# Patient Record
Sex: Female | Born: 1945 | Race: White | Hispanic: No | Marital: Married | Smoking: Never smoker
Health system: Southern US, Community
[De-identification: ages and names within clinical notes are randomized; demographics above are authoritative.]

## PROBLEM LIST (undated history)

## (undated) DIAGNOSIS — R0609 Other forms of dyspnea: Secondary | ICD-10-CM

## (undated) DIAGNOSIS — K219 Gastro-esophageal reflux disease without esophagitis: Secondary | ICD-10-CM

## (undated) DIAGNOSIS — M1990 Unspecified osteoarthritis, unspecified site: Secondary | ICD-10-CM

## (undated) DIAGNOSIS — E538 Deficiency of other specified B group vitamins: Secondary | ICD-10-CM

## (undated) DIAGNOSIS — M549 Dorsalgia, unspecified: Secondary | ICD-10-CM

## (undated) DIAGNOSIS — R251 Tremor, unspecified: Secondary | ICD-10-CM

## (undated) DIAGNOSIS — G8929 Other chronic pain: Secondary | ICD-10-CM

## (undated) DIAGNOSIS — K5790 Diverticulosis of intestine, part unspecified, without perforation or abscess without bleeding: Secondary | ICD-10-CM

## (undated) DIAGNOSIS — G4733 Obstructive sleep apnea (adult) (pediatric): Secondary | ICD-10-CM

## (undated) DIAGNOSIS — Z8719 Personal history of other diseases of the digestive system: Secondary | ICD-10-CM

## (undated) DIAGNOSIS — I10 Essential (primary) hypertension: Secondary | ICD-10-CM

## (undated) DIAGNOSIS — E876 Hypokalemia: Secondary | ICD-10-CM

## (undated) DIAGNOSIS — F988 Other specified behavioral and emotional disorders with onset usually occurring in childhood and adolescence: Secondary | ICD-10-CM

## (undated) DIAGNOSIS — F3341 Major depressive disorder, recurrent, in partial remission: Secondary | ICD-10-CM

## (undated) DIAGNOSIS — E785 Hyperlipidemia, unspecified: Secondary | ICD-10-CM

## (undated) DIAGNOSIS — Z8669 Personal history of other diseases of the nervous system and sense organs: Secondary | ICD-10-CM

## (undated) DIAGNOSIS — J309 Allergic rhinitis, unspecified: Secondary | ICD-10-CM

## (undated) DIAGNOSIS — G4700 Insomnia, unspecified: Secondary | ICD-10-CM

## (undated) DIAGNOSIS — M5430 Sciatica, unspecified side: Secondary | ICD-10-CM

## (undated) DIAGNOSIS — E079 Disorder of thyroid, unspecified: Secondary | ICD-10-CM

## (undated) DIAGNOSIS — D692 Other nonthrombocytopenic purpura: Secondary | ICD-10-CM

## (undated) DIAGNOSIS — Z87442 Personal history of urinary calculi: Secondary | ICD-10-CM

## (undated) DIAGNOSIS — E2740 Unspecified adrenocortical insufficiency: Secondary | ICD-10-CM

## (undated) DIAGNOSIS — R202 Paresthesia of skin: Secondary | ICD-10-CM

## (undated) HISTORY — DX: Other nonthrombocytopenic purpura: D69.2

## (undated) HISTORY — DX: Allergic rhinitis, unspecified: J30.9

## (undated) HISTORY — DX: Hyperlipidemia, unspecified: E78.5

## (undated) HISTORY — DX: Deficiency of other specified B group vitamins: E53.8

## (undated) HISTORY — PX: GANGLION CYST EXCISION: SHX1691

## (undated) HISTORY — DX: Essential (primary) hypertension: I10

## (undated) HISTORY — DX: Hypokalemia: E87.6

## (undated) HISTORY — DX: Major depressive disorder, recurrent, in partial remission: F33.41

## (undated) HISTORY — DX: Disorder of thyroid, unspecified: E07.9

## (undated) HISTORY — PX: ESOPHAGOGASTRODUODENOSCOPY: SHX1529

## (undated) HISTORY — DX: Tremor, unspecified: R25.1

## (undated) HISTORY — PX: COLONOSCOPY: SHX174

## (undated) HISTORY — DX: Obstructive sleep apnea (adult) (pediatric): G47.33

## (undated) HISTORY — DX: Unspecified adrenocortical insufficiency: E27.40

## (undated) HISTORY — PX: HAND SURGERY: SHX662

## (undated) HISTORY — PX: OTHER SURGICAL HISTORY: SHX169

## (undated) HISTORY — DX: Gastro-esophageal reflux disease without esophagitis: K21.9

## (undated) HISTORY — DX: Diverticulosis of intestine, part unspecified, without perforation or abscess without bleeding: K57.90

## (undated) HISTORY — DX: Other forms of dyspnea: R06.09

## (undated) HISTORY — PX: CARPAL TUNNEL RELEASE: SHX101

## (undated) HISTORY — DX: Other specified behavioral and emotional disorders with onset usually occurring in childhood and adolescence: F98.8

## (undated) HISTORY — DX: Unspecified osteoarthritis, unspecified site: M19.90

---

## 1998-07-22 ENCOUNTER — Encounter: Payer: Self-pay | Admitting: Internal Medicine

## 2000-02-23 ENCOUNTER — Encounter: Payer: Self-pay | Admitting: Internal Medicine

## 2000-03-10 ENCOUNTER — Encounter: Payer: Self-pay | Admitting: Internal Medicine

## 2000-03-11 ENCOUNTER — Encounter: Payer: Self-pay | Admitting: Internal Medicine

## 2008-11-19 ENCOUNTER — Ambulatory Visit: Payer: Self-pay | Admitting: Internal Medicine

## 2008-11-19 DIAGNOSIS — K219 Gastro-esophageal reflux disease without esophagitis: Secondary | ICD-10-CM | POA: Insufficient documentation

## 2009-01-02 ENCOUNTER — Ambulatory Visit: Payer: Self-pay | Admitting: Internal Medicine

## 2009-01-02 ENCOUNTER — Encounter: Payer: Self-pay | Admitting: Internal Medicine

## 2009-01-08 ENCOUNTER — Encounter: Payer: Self-pay | Admitting: Internal Medicine

## 2009-01-28 ENCOUNTER — Telehealth: Payer: Self-pay | Admitting: Internal Medicine

## 2009-02-20 ENCOUNTER — Ambulatory Visit: Payer: Self-pay | Admitting: Internal Medicine

## 2011-01-26 NOTE — Progress Notes (Signed)
Summary: Nexium rx  Phone Note Call from Patient Call back at Home Phone [phone]   Caller: Patient Call For: Dr. Juanda Chance Reason for Call: Talk to Nurse Details for Reason: Nexium rx Summary of Call: pt wants to know if she is to continue taking Nexium. Initial call taken by: Vallarie Mare,  January 28, 2009 9:54 AM  Follow-up for Phone Call        Pt. was taking Prevacid, states Dr.Karolee Meloni gave her samples of Nexium after Endoscopy. States the Nexium controls GERD better than Prevacid. Pt. will call her insurance company to discuss prices of her PPI, she will callback to let me know what script will need to be sent. Follow-up by: Laureen Ochs LPN,  January 28, 2009 10:00 AM  Additional Follow-up for Phone Call Additional follow up Details #1::        Pt. states that her PCP has given her a script for Nexium and it was approved by her insurance. I will update her med.list. Pt. instructed to call back as needed.  Additional Follow-up by: Laureen Ochs LPN,  January 30, 2009 9:07 AM    New/Updated Medications: NEXIUM 40 MG  CPDR (ESOMEPRAZOLE MAGNESIUM) 1 capsule each day 30 minutes before meal   Prescriptions: NEXIUM 40 MG  CPDR (ESOMEPRAZOLE MAGNESIUM) 1 capsule each day 30 minutes before meal  #30 x 6   Entered by:   Laureen Ochs LPN   Authorized by:   Hart Carwin MD   Signed by:   Laureen Ochs LPN on 19/14/7829   Method used:   Historical   RxID:   [id number]

## 2011-01-26 NOTE — Procedures (Signed)
Summary: Colonoscopy   Colonoscopy  Procedure date:  01/02/2009  Findings:      Location:  [facility].    Procedures Next Due Date:    Colonoscopy: 12/2018  COLONOSCOPY PROCEDURE REPORT  PATIENT:  Meghan Tucker, Meghan Tucker  MR#:  [id number] BIRTHDATE:   1946-04-08   GENDER:   female  ENDOSCOPIST:   Hedwig Morton. Juanda Chance, MD Referred by: Wyonia Hough, M.D.  PROCEDURE DATE:  01/02/2009 PROCEDURE:  Colonoscopy, diagnostic ASA CLASS:   Class I INDICATIONS: screening pt had a prior colonoscopy 10 years ago  MEDICATIONS:    Versed 5 mg, Fentanyl 50 mcg  DESCRIPTION OF PROCEDURE:   After the risks benefits and alternatives of the procedure were thoroughly explained, informed consent was obtained.  No rectal exam performed. The LB PCF-H180AL [id number] endoscope was introduced through the anus and advanced to the cecum, which was identified by both the appendix and ileocecal valve, without limitations.  The quality of the prep was good, using MiraLax.  The instrument was then slowly withdrawn as the colon was fully examined. <<PROCEDUREIMAGES>>      <<OLD IMAGES>>  FINDINGS:  Moderate diverticulosis was found in the sigmoid colon (see image1).  normal cecum (see image2 and image3).  normal rectum (see image4).   Retroflexed views in the rectum revealed no abnormalities.    The scope was then withdrawn from the patient and the procedure completed.  COMPLICATIONS:   None  ENDOSCOPIC IMPRESSION:  1) Moderate diverticulosis in the sigmoid colon  2) Normal cecum  3) Normal rectum  no polyps RECOMMENDATIONS:  1) high fiber diet  REPEAT EXAM:   In 10 year(s) for.   _______________________________ Hedwig Morton. Juanda Chance, MD  CC: Wyonia Hough, MD

## 2011-01-26 NOTE — Procedures (Signed)
Summary: EGD  EGD   Imported By: Francee Piccolo CMA 12/05/2008 14:57:46  _____________________________________________________________________  External Attachment:    Type:   Image     Comment:   External Document

## 2011-01-26 NOTE — Assessment & Plan Note (Signed)
Summary: f/u after col..em   History of Present Illness Visit Type: follow up Primary GI MD: Lina Sar MD Primary Provider: Anderson Malta Requesting Provider: n/a Chief Complaint: follow-up visit after colonoscopy History of Present Illness:   This is a 65 year old white female who recently underwent an upper endoscopy for evaluation of gastroesophageal reflux as well as a screening colonoscopy which showed only mild diverticulosis of the left colon. The upper endoscopy was done with biopsies which came back H. Pylori positive. Patient has completed Prevpac without any undesirable side effects. She continues on Nexium 40 mg a day. Patient prefers to continue on her Nexium because of her history of chronic intermittent reflux.   GI Review of Systems      Denies abdominal pain, acid reflux, belching, bloating, chest pain, dysphagia with liquids, dysphagia with solids, heartburn, loss of appetite, nausea, vomiting, vomiting blood, weight loss, and  weight gain.        Denies anal fissure, black tarry stools, change in bowel habit, constipation, diarrhea, diverticulosis, fecal incontinence, heme positive stool, hemorrhoids, irritable bowel syndrome, jaundice, light color stool, liver problems, rectal bleeding, and  rectal pain.   Updated Prior Medication List: FISH OIL  OIL (FISH OIL) 1 capsule by mouth once daily ASPIRIN 81 MG TABS (ASPIRIN) 1 by mouth once daily VITAMIN B-12 1000 MCG TABS (CYANOCOBALAMIN) 2500 micrograms once daily [facility] ALLERGY 25 MG CAPS (DIPHENHYDRAMINE HCL) 1 by mouth once daily VITAMIN C CR 1000 MG CR-TABS (ASCORBIC ACID) 1 by mouth once daily BIOTIN 1000 MCG TABS (BIOTIN) 1 by mouth once daily COSAMIN DS 500-400 MG CAPS (GLUCOSAMINE-CHONDROITIN) 2 by mouth once daily NEXIUM 40 MG  CPDR (ESOMEPRAZOLE MAGNESIUM) 1 capsule each day 30 minutes before meal SIMVASTATIN 40 MG TABS (SIMVASTATIN) 1/2 by mouth once daily SINGULAIR 10 MG TABS (MONTELUKAST SODIUM) 1 by  mouth once daily ZINC 15 66 MG TABS (ZINC SULFATE) 1 tablet by mouth once daily  Current Allergies (reviewed today): No known allergies  Past Medical History:    Reviewed history from 11/19/2008 and no changes required:       Arthritis       Asthma       Chronic Headaches       Diverticulosis       Hyperlipidemia  Past Surgical History:    Reviewed history from 11/19/2008 and no changes required:       Carpal Tunnel Release   Family History:    Reviewed history from 11/19/2008 and no changes required:       No FH of Colon Cancer:       Family History of Heart Disease: Mother and father  Mother had CHF Father died at age 66 -Massive Heart Attack  Social History:    Reviewed history from 11/19/2008 and no changes required:       Occupation: Housewife       Patient has never smoked.        Alcohol Use - yes 1-2 wine occ       Daily Caffeine Use  diet coke       Illicit Drug Use - no  Vital Signs:  Patient Profile:   65 Years Old Female Height:     61.5 inches Weight:      147 pounds BMI:     27.42 BSA:     1.67 Pulse rate:   84 / minute BP sitting:   130 / 84  (left arm)  Vitals Entered By: Milford Cage CMA (February 20, 2009 10:30 AM)                   Impression & Recommendations:  Problem # 1:  GERD (ICD-530.81) controlled on Nexium 40 mg daily. Patient will continue on Nexium and antireflux measures. She has completed her H. pylori treatment.  Problem # 2:  SPECIAL SCREENING FOR MALIGNANT NEOPLASMS COLON (ICD-V76.51) mild diverticulosis seen on a recent colonoscopy in January 2010. Her next colonoscopy will be due in 10 years.   Patient Instructions: 1)  Nexium 40 mg p.o. q.d. 2)  Antireflux measures 3)  Recall colonoscopy January 2020 4)  Copy Sent To:Dr Blane Ohara, Rosalita Levan

## 2011-01-26 NOTE — Procedures (Signed)
Summary: Colonoscopy  Colonoscopy   Imported By: Francee Piccolo CMA 12/05/2008 14:59:05  _____________________________________________________________________  External Attachment:    Type:   Image     Comment:   External Document

## 2011-01-26 NOTE — Miscellaneous (Signed)
Summary: prepak rx.  Clinical Lists Changes  Medications: Added new medication of PREVPAC   MISC (AMOXICILL-CLARITHRO-LANSOPRAZ) take as instructed. - Signed Rx of PREVPAC   MISC (AMOXICILL-CLARITHRO-LANSOPRAZ) take as instructed.;  #1 x 0;  Signed;  Entered by: Darlyn Read RN;  Authorized by: Hart Carwin MD;  Method used: Electronically to [facility] [address], [place], [place], [place]  [address], Ph: [phone], Fax: [phone]    Prescriptions: [facility]   MISC (AMOXICILL-CLARITHRO-LANSOPRAZ) take as instructed.  #1 x 0   Entered by:   Darlyn Read RN   Authorized by:   Hart Carwin MD   Signed by:   Darlyn Read RN on 01/08/2009   Method used:   Electronically to        [facility]* (retail)       [address] [place], [place]  [address]       Ph: [phone]       Fax: [phone]   RxID:   [id number]

## 2011-01-26 NOTE — Letter (Signed)
Summary: Patient [facility] Biopsy Results  [facility]  [address] [place], [place] [address]   Phone: [phone]  Fax: [phone]        January 08, 2009 MRN: [id number]    Meghan Tucker [address] [place], [place]  [address]    Dear Ms. Beckel,  I am pleased to inform you that the biopsies taken during your recent endoscopic examination did not show any evidence of cancer upon pathologic examination.The gastric biopsies showed mild gastritis with Helicobacter Pylori.  Additional information/recommendations:  __No further action is needed at this time.  Please follow-up with      your primary care physician for your other healthcare needs.  _x_ Please call [phone] to schedule a return visit to review      your condition.  _x_ Continue with the treatment plan as outlined on the day of your      exam.Please continue Prevpak as directed.    Please call us if you are having persistent problems or have questions about your condition that have not been fully answered at this time.  Sincerely,  Hart Carwin MD  This letter has been electronically signed by your physician.

## 2011-01-26 NOTE — Procedures (Signed)
Summary: EGD   EGD  Procedure date:  01/02/2009  Findings:      Location: [facility]    ENDOSCOPY PROCEDURE REPORT  PATIENT:  Meghan Tucker, Meghan Tucker  MR#:  [id number] BIRTHDATE:   07-31-1946   GENDER:   female  ENDOSCOPIST:   Hedwig Morton. Juanda Chance, MD Referred by: Wyonia Hough, M.D.  PROCEDURE DATE:  01/02/2009 PROCEDURE:  EGD with biopsy ASA CLASS:   Class I INDICATIONS: heartburn, reflux symptoms despite therapy   MEDICATIONS:    There was residual sedation effect present from prior procedure. 7 mg, Fentanyl 50 mcg TOPICAL ANESTHETIC:   Exactacain Spray  DESCRIPTION OF PROCEDURE:   After the risks benefits and alternatives of the procedure were thoroughly explained, informed consent was obtained.  The LB GIF-H180 [id number] endoscope was introduced through the mouth and advanced to the second portion of the duodenum, without limitations.  The instrument was slowly withdrawn as the mucosa was fully examined. <<PROCEDUREIMAGES>>                    <<OLD IMAGES>>  Normal GE junction was noted. With standard forceps, a biopsy was obtained and sent to pathology (see image8 and image7). irregular z-line  Mild gastritis was found in the antrum. With standard forceps, a biopsy was obtained and sent to pathology (see image6 and image5).  The duodenal bulb was normal in appearance, as was the postbulbar duodenum (see image3 and image4).  A hiatal hernia was found (see image5 and image2). 1-2 cm hiatal hernia  Abnormal appearing mucosa in the proximal esophagus (see image10 and image9). at 15 cm large gastric mucosa inlet patch, extending circumferentially, biopsies taken to r/o Barrett's esophagus    Retroflexed views revealed a hiatal hernia.    The scope was then withdrawn from the patient and the procedure completed.  COMPLICATIONS:   None  ENDOSCOPIC IMPRESSION:  1) Normal GE junction  2) Mild gastritis in the antrum  3) Normal duodenum  4) Hiatal hernia  irregular z-line, s/p  biopsies at the ge- junction  large gastric imlet patch at 15 cm proximal esophagus, r/o Barrett's esophagus RECOMMENDATIONS:  1) Prevacid 30 mg qd  2) anti-reflux regimen  REPEAT EXAM:   No   _______________________________ Hedwig Morton. Juanda Chance, MD    CC: Wyonia Hough, MD    REPORT OF SURGICAL PATHOLOGY   Case #: [id number] Patient Name: Meghan Tucker. Office Chart Number:  N/A   MRN: [id number] Pathologist: Ferd Hibbs. Colonel Bald, MD DOB/Age  09/18/1946 (Age: 65)    Gender: F Date Taken:  01/02/2009 Date Received: 01/03/2009   FINAL DIAGNOSIS   ***MICROSCOPIC EXAMINATION AND DIAGNOSIS***   1.  STOMACH, BIOPSY:   - CHRONIC GASTRITIS WITH HELICOBACTER PYLORI.   - NO INTESTINAL METAPLASIA, DYSPLASIA OR MALIGNANCY IDENTIFIED. - SEE COMMENT.   2.  GASTROESOPHAGEAL JUNCTION, BIOPSY:   - ESOPHAGOGASTRIC JUNCTION MUCOSA WITH MILD INFLAMMATION.   - THERE IS NO EVIDENCE OF INTESTINAL METAPLASIA, DYSPLASIA OR MALIGNANCY.   - SEE COMMENT.    3.  ESOPHAGUS, BIOPSY:   - ESOPHAGOGASTRIC JUNCTION MUCOSA WITH MILD INFLAMMATION.   - THERE IS NO EVIDENCE OF INTESTINAL METAPLASIA, DYSPLASIA OR MALIGNANCY. - SEE COMMENT.     COMMENT 1. A Warthin-Starry stain highlights the presence of Helicobacter pylori organisms.     2, 3.  An Alcian Blue stain is performed to determine the presence of intestinal metaplasia (goblet cell metaplasia). No intestinal metaplasia (goblet cell metaplasia) is identified with the Alcian Blue stain. The  control stained appropriately.   cc Date Reported:  01/06/2009     Ivin Booty B. Colonel Bald, MD *** Electronically Signed Out By JBK ***   January 08, 2009 MRN: [id number]    Meghan Tucker [address] [place], [place]  [address]    Dear Ms. Holan,  I am pleased to inform you that the biopsies taken during your recent endoscopic examination did not show any evidence of cancer upon pathologic examination.The gastric biopsies showed mild gastritis with Helicobacter  Pylori.  Additional information/recommendations:  __No further action is needed at this time.  Please follow-up with      your primary care physician for your other healthcare needs.  _x_ Please call [phone] to schedule a return visit to review      your condition.  _x_ Continue with the treatment plan as outlined on the day of your      exam.Please continue Prevpak as directed.    Please call us if you are having persistent problems or have questions about your condition that have not been fully answered at this time.  Sincerely,  Hart Carwin MD  This letter has been electronically signed by your physician.  This report was created from the original endoscopy report, which was reviewed and signed by the above listed endoscopist.

## 2012-01-06 DIAGNOSIS — M47817 Spondylosis without myelopathy or radiculopathy, lumbosacral region: Secondary | ICD-10-CM | POA: Diagnosis not present

## 2012-01-07 ENCOUNTER — Other Ambulatory Visit: Payer: Self-pay | Admitting: Neurosurgery

## 2012-01-07 DIAGNOSIS — M47816 Spondylosis without myelopathy or radiculopathy, lumbar region: Secondary | ICD-10-CM

## 2012-01-10 DIAGNOSIS — M79609 Pain in unspecified limb: Secondary | ICD-10-CM | POA: Diagnosis not present

## 2012-01-12 DIAGNOSIS — M5126 Other intervertebral disc displacement, lumbar region: Secondary | ICD-10-CM | POA: Diagnosis not present

## 2012-01-12 DIAGNOSIS — M47817 Spondylosis without myelopathy or radiculopathy, lumbosacral region: Secondary | ICD-10-CM | POA: Diagnosis not present

## 2012-01-25 DIAGNOSIS — M47817 Spondylosis without myelopathy or radiculopathy, lumbosacral region: Secondary | ICD-10-CM | POA: Diagnosis not present

## 2012-01-25 DIAGNOSIS — M4310 Spondylolisthesis, site unspecified: Secondary | ICD-10-CM | POA: Diagnosis not present

## 2012-02-08 DIAGNOSIS — Z1231 Encounter for screening mammogram for malignant neoplasm of breast: Secondary | ICD-10-CM | POA: Diagnosis not present

## 2012-02-09 DIAGNOSIS — K921 Melena: Secondary | ICD-10-CM | POA: Diagnosis not present

## 2012-02-09 DIAGNOSIS — R1084 Generalized abdominal pain: Secondary | ICD-10-CM | POA: Diagnosis not present

## 2012-02-13 DIAGNOSIS — S81809A Unspecified open wound, unspecified lower leg, initial encounter: Secondary | ICD-10-CM | POA: Diagnosis not present

## 2012-05-16 DIAGNOSIS — Z79899 Other long term (current) drug therapy: Secondary | ICD-10-CM | POA: Diagnosis not present

## 2012-05-16 DIAGNOSIS — M899 Disorder of bone, unspecified: Secondary | ICD-10-CM | POA: Diagnosis not present

## 2012-05-16 DIAGNOSIS — IMO0001 Reserved for inherently not codable concepts without codable children: Secondary | ICD-10-CM | POA: Diagnosis not present

## 2012-05-16 DIAGNOSIS — M949 Disorder of cartilage, unspecified: Secondary | ICD-10-CM | POA: Diagnosis not present

## 2012-05-16 DIAGNOSIS — E7800 Pure hypercholesterolemia, unspecified: Secondary | ICD-10-CM | POA: Diagnosis not present

## 2012-05-16 DIAGNOSIS — E890 Postprocedural hypothyroidism: Secondary | ICD-10-CM | POA: Diagnosis not present

## 2012-05-19 DIAGNOSIS — Z124 Encounter for screening for malignant neoplasm of cervix: Secondary | ICD-10-CM | POA: Diagnosis not present

## 2012-05-23 DIAGNOSIS — M545 Low back pain, unspecified: Secondary | ICD-10-CM | POA: Diagnosis not present

## 2012-05-23 DIAGNOSIS — M6530 Trigger finger, unspecified finger: Secondary | ICD-10-CM | POA: Diagnosis not present

## 2012-05-23 DIAGNOSIS — M25579 Pain in unspecified ankle and joints of unspecified foot: Secondary | ICD-10-CM | POA: Diagnosis not present

## 2012-05-23 DIAGNOSIS — M19049 Primary osteoarthritis, unspecified hand: Secondary | ICD-10-CM | POA: Diagnosis not present

## 2012-05-30 DIAGNOSIS — E559 Vitamin D deficiency, unspecified: Secondary | ICD-10-CM | POA: Diagnosis not present

## 2012-05-30 DIAGNOSIS — K219 Gastro-esophageal reflux disease without esophagitis: Secondary | ICD-10-CM | POA: Diagnosis not present

## 2012-05-30 DIAGNOSIS — E7800 Pure hypercholesterolemia, unspecified: Secondary | ICD-10-CM | POA: Diagnosis not present

## 2012-05-30 DIAGNOSIS — M159 Polyosteoarthritis, unspecified: Secondary | ICD-10-CM | POA: Diagnosis not present

## 2012-05-30 DIAGNOSIS — IMO0001 Reserved for inherently not codable concepts without codable children: Secondary | ICD-10-CM | POA: Diagnosis not present

## 2012-05-30 DIAGNOSIS — J301 Allergic rhinitis due to pollen: Secondary | ICD-10-CM | POA: Diagnosis not present

## 2012-07-06 DIAGNOSIS — S92309A Fracture of unspecified metatarsal bone(s), unspecified foot, initial encounter for closed fracture: Secondary | ICD-10-CM | POA: Diagnosis not present

## 2012-07-26 DIAGNOSIS — M47817 Spondylosis without myelopathy or radiculopathy, lumbosacral region: Secondary | ICD-10-CM | POA: Diagnosis not present

## 2012-07-31 DIAGNOSIS — S92309A Fracture of unspecified metatarsal bone(s), unspecified foot, initial encounter for closed fracture: Secondary | ICD-10-CM | POA: Diagnosis not present

## 2012-08-23 DIAGNOSIS — H01009 Unspecified blepharitis unspecified eye, unspecified eyelid: Secondary | ICD-10-CM | POA: Diagnosis not present

## 2012-08-23 DIAGNOSIS — L820 Inflamed seborrheic keratosis: Secondary | ICD-10-CM | POA: Diagnosis not present

## 2012-08-23 DIAGNOSIS — L981 Factitial dermatitis: Secondary | ICD-10-CM | POA: Diagnosis not present

## 2012-09-04 DIAGNOSIS — M949 Disorder of cartilage, unspecified: Secondary | ICD-10-CM | POA: Diagnosis not present

## 2012-09-04 DIAGNOSIS — E890 Postprocedural hypothyroidism: Secondary | ICD-10-CM | POA: Diagnosis not present

## 2012-09-04 DIAGNOSIS — M899 Disorder of bone, unspecified: Secondary | ICD-10-CM | POA: Diagnosis not present

## 2012-09-04 DIAGNOSIS — E559 Vitamin D deficiency, unspecified: Secondary | ICD-10-CM | POA: Diagnosis not present

## 2012-09-04 DIAGNOSIS — Z79899 Other long term (current) drug therapy: Secondary | ICD-10-CM | POA: Diagnosis not present

## 2012-09-04 DIAGNOSIS — E7800 Pure hypercholesterolemia, unspecified: Secondary | ICD-10-CM | POA: Diagnosis not present

## 2012-09-04 DIAGNOSIS — Z23 Encounter for immunization: Secondary | ICD-10-CM | POA: Diagnosis not present

## 2012-09-06 DIAGNOSIS — K219 Gastro-esophageal reflux disease without esophagitis: Secondary | ICD-10-CM | POA: Diagnosis not present

## 2012-09-06 DIAGNOSIS — E559 Vitamin D deficiency, unspecified: Secondary | ICD-10-CM | POA: Diagnosis not present

## 2012-09-06 DIAGNOSIS — J301 Allergic rhinitis due to pollen: Secondary | ICD-10-CM | POA: Diagnosis not present

## 2012-09-06 DIAGNOSIS — M79609 Pain in unspecified limb: Secondary | ICD-10-CM | POA: Diagnosis not present

## 2012-09-06 DIAGNOSIS — E7800 Pure hypercholesterolemia, unspecified: Secondary | ICD-10-CM | POA: Diagnosis not present

## 2012-09-07 DIAGNOSIS — R197 Diarrhea, unspecified: Secondary | ICD-10-CM | POA: Diagnosis not present

## 2012-09-07 DIAGNOSIS — R1084 Generalized abdominal pain: Secondary | ICD-10-CM | POA: Diagnosis not present

## 2012-09-12 DIAGNOSIS — M79609 Pain in unspecified limb: Secondary | ICD-10-CM | POA: Diagnosis not present

## 2012-09-12 DIAGNOSIS — J069 Acute upper respiratory infection, unspecified: Secondary | ICD-10-CM | POA: Diagnosis not present

## 2012-09-12 DIAGNOSIS — R21 Rash and other nonspecific skin eruption: Secondary | ICD-10-CM | POA: Diagnosis not present

## 2012-09-15 DIAGNOSIS — L719 Rosacea, unspecified: Secondary | ICD-10-CM | POA: Diagnosis not present

## 2012-10-02 DIAGNOSIS — S8263XA Displaced fracture of lateral malleolus of unspecified fibula, initial encounter for closed fracture: Secondary | ICD-10-CM | POA: Diagnosis not present

## 2012-10-09 DIAGNOSIS — H01009 Unspecified blepharitis unspecified eye, unspecified eyelid: Secondary | ICD-10-CM | POA: Diagnosis not present

## 2012-10-09 DIAGNOSIS — M949 Disorder of cartilage, unspecified: Secondary | ICD-10-CM | POA: Diagnosis not present

## 2012-10-09 DIAGNOSIS — M899 Disorder of bone, unspecified: Secondary | ICD-10-CM | POA: Diagnosis not present

## 2012-10-09 DIAGNOSIS — S8263XA Displaced fracture of lateral malleolus of unspecified fibula, initial encounter for closed fracture: Secondary | ICD-10-CM | POA: Diagnosis not present

## 2012-10-09 DIAGNOSIS — M8430XA Stress fracture, unspecified site, initial encounter for fracture: Secondary | ICD-10-CM | POA: Diagnosis not present

## 2012-10-13 DIAGNOSIS — M899 Disorder of bone, unspecified: Secondary | ICD-10-CM | POA: Diagnosis not present

## 2012-10-13 DIAGNOSIS — M949 Disorder of cartilage, unspecified: Secondary | ICD-10-CM | POA: Diagnosis not present

## 2012-10-18 DIAGNOSIS — S8263XA Displaced fracture of lateral malleolus of unspecified fibula, initial encounter for closed fracture: Secondary | ICD-10-CM | POA: Diagnosis not present

## 2012-10-26 DIAGNOSIS — L719 Rosacea, unspecified: Secondary | ICD-10-CM | POA: Diagnosis not present

## 2012-11-07 DIAGNOSIS — H2589 Other age-related cataract: Secondary | ICD-10-CM | POA: Diagnosis not present

## 2012-11-08 DIAGNOSIS — S8263XA Displaced fracture of lateral malleolus of unspecified fibula, initial encounter for closed fracture: Secondary | ICD-10-CM | POA: Diagnosis not present

## 2012-11-13 DIAGNOSIS — S8263XA Displaced fracture of lateral malleolus of unspecified fibula, initial encounter for closed fracture: Secondary | ICD-10-CM | POA: Diagnosis not present

## 2012-11-14 DIAGNOSIS — M899 Disorder of bone, unspecified: Secondary | ICD-10-CM | POA: Diagnosis not present

## 2012-11-14 DIAGNOSIS — M949 Disorder of cartilage, unspecified: Secondary | ICD-10-CM | POA: Diagnosis not present

## 2012-11-14 DIAGNOSIS — M8430XA Stress fracture, unspecified site, initial encounter for fracture: Secondary | ICD-10-CM | POA: Diagnosis not present

## 2012-11-17 DIAGNOSIS — S8263XA Displaced fracture of lateral malleolus of unspecified fibula, initial encounter for closed fracture: Secondary | ICD-10-CM | POA: Diagnosis not present

## 2012-11-20 DIAGNOSIS — S8263XA Displaced fracture of lateral malleolus of unspecified fibula, initial encounter for closed fracture: Secondary | ICD-10-CM | POA: Diagnosis not present

## 2012-11-22 DIAGNOSIS — J0100 Acute maxillary sinusitis, unspecified: Secondary | ICD-10-CM | POA: Diagnosis not present

## 2012-11-27 DIAGNOSIS — S8263XA Displaced fracture of lateral malleolus of unspecified fibula, initial encounter for closed fracture: Secondary | ICD-10-CM | POA: Diagnosis not present

## 2012-11-27 DIAGNOSIS — M25579 Pain in unspecified ankle and joints of unspecified foot: Secondary | ICD-10-CM | POA: Diagnosis not present

## 2012-12-01 DIAGNOSIS — R21 Rash and other nonspecific skin eruption: Secondary | ICD-10-CM | POA: Diagnosis not present

## 2012-12-11 DIAGNOSIS — S8263XA Displaced fracture of lateral malleolus of unspecified fibula, initial encounter for closed fracture: Secondary | ICD-10-CM | POA: Diagnosis not present

## 2013-01-15 DIAGNOSIS — J069 Acute upper respiratory infection, unspecified: Secondary | ICD-10-CM | POA: Diagnosis not present

## 2013-02-06 DIAGNOSIS — J0100 Acute maxillary sinusitis, unspecified: Secondary | ICD-10-CM | POA: Diagnosis not present

## 2013-02-12 DIAGNOSIS — J209 Acute bronchitis, unspecified: Secondary | ICD-10-CM | POA: Diagnosis not present

## 2013-02-16 DIAGNOSIS — Z1231 Encounter for screening mammogram for malignant neoplasm of breast: Secondary | ICD-10-CM | POA: Diagnosis not present

## 2013-05-09 DIAGNOSIS — D692 Other nonthrombocytopenic purpura: Secondary | ICD-10-CM | POA: Diagnosis not present

## 2013-05-09 DIAGNOSIS — L909 Atrophic disorder of skin, unspecified: Secondary | ICD-10-CM | POA: Diagnosis not present

## 2013-05-09 DIAGNOSIS — L905 Scar conditions and fibrosis of skin: Secondary | ICD-10-CM | POA: Diagnosis not present

## 2013-05-09 DIAGNOSIS — L821 Other seborrheic keratosis: Secondary | ICD-10-CM | POA: Diagnosis not present

## 2013-05-09 DIAGNOSIS — L719 Rosacea, unspecified: Secondary | ICD-10-CM | POA: Diagnosis not present

## 2013-05-09 DIAGNOSIS — D2370 Other benign neoplasm of skin of unspecified lower limb, including hip: Secondary | ICD-10-CM | POA: Diagnosis not present

## 2013-05-09 DIAGNOSIS — L820 Inflamed seborrheic keratosis: Secondary | ICD-10-CM | POA: Diagnosis not present

## 2013-05-09 DIAGNOSIS — L919 Hypertrophic disorder of the skin, unspecified: Secondary | ICD-10-CM | POA: Diagnosis not present

## 2013-05-09 DIAGNOSIS — I789 Disease of capillaries, unspecified: Secondary | ICD-10-CM | POA: Diagnosis not present

## 2013-05-16 DIAGNOSIS — M899 Disorder of bone, unspecified: Secondary | ICD-10-CM | POA: Diagnosis not present

## 2013-05-16 DIAGNOSIS — N3000 Acute cystitis without hematuria: Secondary | ICD-10-CM | POA: Diagnosis not present

## 2013-05-16 DIAGNOSIS — M949 Disorder of cartilage, unspecified: Secondary | ICD-10-CM | POA: Diagnosis not present

## 2013-05-18 DIAGNOSIS — E7800 Pure hypercholesterolemia, unspecified: Secondary | ICD-10-CM | POA: Diagnosis not present

## 2013-05-18 DIAGNOSIS — E559 Vitamin D deficiency, unspecified: Secondary | ICD-10-CM | POA: Diagnosis not present

## 2013-05-18 DIAGNOSIS — Z79899 Other long term (current) drug therapy: Secondary | ICD-10-CM | POA: Diagnosis not present

## 2013-05-22 DIAGNOSIS — Z124 Encounter for screening for malignant neoplasm of cervix: Secondary | ICD-10-CM | POA: Diagnosis not present

## 2013-06-01 DIAGNOSIS — D539 Nutritional anemia, unspecified: Secondary | ICD-10-CM | POA: Diagnosis not present

## 2013-06-18 DIAGNOSIS — E7800 Pure hypercholesterolemia, unspecified: Secondary | ICD-10-CM | POA: Diagnosis not present

## 2013-06-18 DIAGNOSIS — M159 Polyosteoarthritis, unspecified: Secondary | ICD-10-CM | POA: Diagnosis not present

## 2013-06-18 DIAGNOSIS — G4700 Insomnia, unspecified: Secondary | ICD-10-CM | POA: Diagnosis not present

## 2013-06-18 DIAGNOSIS — M65839 Other synovitis and tenosynovitis, unspecified forearm: Secondary | ICD-10-CM | POA: Diagnosis not present

## 2013-06-18 DIAGNOSIS — M65849 Other synovitis and tenosynovitis, unspecified hand: Secondary | ICD-10-CM | POA: Diagnosis not present

## 2013-06-18 DIAGNOSIS — J309 Allergic rhinitis, unspecified: Secondary | ICD-10-CM | POA: Diagnosis not present

## 2013-08-16 DIAGNOSIS — S92309A Fracture of unspecified metatarsal bone(s), unspecified foot, initial encounter for closed fracture: Secondary | ICD-10-CM | POA: Diagnosis not present

## 2013-08-16 DIAGNOSIS — L719 Rosacea, unspecified: Secondary | ICD-10-CM | POA: Diagnosis not present

## 2013-08-16 DIAGNOSIS — M7730 Calcaneal spur, unspecified foot: Secondary | ICD-10-CM | POA: Diagnosis not present

## 2013-08-16 DIAGNOSIS — E7800 Pure hypercholesterolemia, unspecified: Secondary | ICD-10-CM | POA: Diagnosis not present

## 2013-08-16 DIAGNOSIS — M79609 Pain in unspecified limb: Secondary | ICD-10-CM | POA: Diagnosis not present

## 2013-09-05 DIAGNOSIS — J069 Acute upper respiratory infection, unspecified: Secondary | ICD-10-CM | POA: Diagnosis not present

## 2013-09-13 DIAGNOSIS — M7750 Other enthesopathy of unspecified foot: Secondary | ICD-10-CM | POA: Diagnosis not present

## 2013-09-28 DIAGNOSIS — Z23 Encounter for immunization: Secondary | ICD-10-CM | POA: Diagnosis not present

## 2013-10-03 DIAGNOSIS — R3915 Urgency of urination: Secondary | ICD-10-CM | POA: Diagnosis not present

## 2013-10-03 DIAGNOSIS — E7800 Pure hypercholesterolemia, unspecified: Secondary | ICD-10-CM | POA: Diagnosis not present

## 2013-10-03 DIAGNOSIS — K219 Gastro-esophageal reflux disease without esophagitis: Secondary | ICD-10-CM | POA: Diagnosis not present

## 2013-10-03 DIAGNOSIS — R233 Spontaneous ecchymoses: Secondary | ICD-10-CM | POA: Diagnosis not present

## 2013-10-03 DIAGNOSIS — J309 Allergic rhinitis, unspecified: Secondary | ICD-10-CM | POA: Diagnosis not present

## 2013-10-03 DIAGNOSIS — R109 Unspecified abdominal pain: Secondary | ICD-10-CM | POA: Diagnosis not present

## 2013-10-05 DIAGNOSIS — R109 Unspecified abdominal pain: Secondary | ICD-10-CM | POA: Diagnosis not present

## 2013-10-10 DIAGNOSIS — R109 Unspecified abdominal pain: Secondary | ICD-10-CM | POA: Diagnosis not present

## 2013-10-11 ENCOUNTER — Encounter: Payer: Self-pay | Admitting: *Deleted

## 2013-10-12 ENCOUNTER — Encounter: Payer: Self-pay | Admitting: Gastroenterology

## 2013-10-12 ENCOUNTER — Ambulatory Visit (INDEPENDENT_AMBULATORY_CARE_PROVIDER_SITE_OTHER): Payer: Medicare Other | Admitting: Gastroenterology

## 2013-10-12 VITALS — BP 114/68 | HR 76 | Ht 61.0 in | Wt 138.4 lb

## 2013-10-12 DIAGNOSIS — R110 Nausea: Secondary | ICD-10-CM

## 2013-10-12 DIAGNOSIS — K219 Gastro-esophageal reflux disease without esophagitis: Secondary | ICD-10-CM | POA: Diagnosis not present

## 2013-10-12 MED ORDER — OMEPRAZOLE 40 MG PO CPDR: 40.0000 mg | DELAYED_RELEASE_CAPSULE | Freq: Two times a day (BID) | ORAL | Status: DC

## 2013-10-12 NOTE — Progress Notes (Signed)
Reviewed and agree, with  Bid PPI and EGD

## 2013-10-12 NOTE — Progress Notes (Signed)
10/12/2013 Meghan Tucker [id number] 1946/08/18   HISTORY OF PRESENT ILLNESS:  This is a pleasant 67 year old female who is a patient of Dr. Regino Schultze.  She presents to the office today with complaints of indigestion, belching, and epigastric discomfort. These symptoms have been present for approximately 1.5 years. She has been taking omeprazole 20 mg daily for the past year since her PCP placed her on Celebrex daily. She had been on Nexium at some point as well. She also states that about a week ago she developed significant nausea in association with her other symptoms. She states that she feels great if she does not eat anything, but if she tries to eat she feels sick. Her PCP gave her samples of Dexilant 60 mg daily, but she only had 5 days worth of that medication. Her PCP ordered some labs, however, we do not have those results at this time and are waiting for this to be faxed to [place]. She also had an ultrasound of her right upper quadrant which showed no abnormalities. HIDA scan was also performed and showed normal hepatobiliary patency and normal gallbladder ejection fraction.  Her last EGD was in January of 2010 at which time she was found to have a normal GE junction, mild gastritis, normal duodenum, hiatal hernia, irregular Z line, and large gastric inlet patch at 15 cm. Biopsy was were taken to rule out Barrett's and those were negative for that condition. Biopsy showed mild inflammation.   Past Medical History  Diagnosis Date  . GERD (gastroesophageal reflux disease)   . Diverticulosis   . Arthritis   . Asthma   . Chronic headache   . Hyperlipidemia   . H. pylori infection 01/03/09  . Multinodular goiter   . Osteopenia   . Vitamin D deficiency    Past Surgical History  Procedure Laterality Date  . Carpal tunnel release Left     reports that she has never smoked. She has never used smokeless tobacco. She reports that she drinks alcohol. She reports that she does not use illicit  drugs. family history includes Heart disease in her father; Stroke in her mother. There is no history of Colon cancer, Colon polyps, Liver disease, or Kidney disease. No Known Allergies    Outpatient Encounter Prescriptions as of 10/12/2013  Medication Sig Dispense Refill  . AMBULATORY NON FORMULARY MEDICATION Medication Name: Calcium Citrate with Vitamin D, magnesiun and Zinc 500 mg-Take 1 tablet daily alternating with 1/2 tablet daily      . AMBULATORY NON FORMULARY MEDICATION Medication Name: Purpurex 2 tablets daily      . aspirin (BAYER LOW DOSE) 81 MG EC tablet Take 81 mg by mouth daily. Swallow whole.      . Biotin 5000 MCG CAPS Take 1 capsule by mouth daily.      . celecoxib (CELEBREX) 200 MG capsule Take 200 mg by mouth daily.      . Coenzyme Q10 (COQ10) 200 MG CAPS Take 1 capsule by mouth daily.      Marland Kitchen denosumab (PROLIA) 60 MG/ML SOLN injection Inject 60 mg into the skin as directed. Once per year      . ezetimibe (ZETIA) 10 MG tablet Take 10 mg by mouth daily.      . fish oil-omega-3 fatty acids 1000 MG capsule Take 2 capsules daily      . Glucosamine-Chondroitin (COSAMIN DS PO) Take 1 capsule by mouth daily.      Marland Kitchen l-methylfolate-B6-B12 (METANX) 3-35-2 MG TABS Take 2 tablets by mouth  daily.      . loratadine ([facility]S ALLERGY RELIEF) 10 MG tablet Take 10 mg by mouth daily.      . Milk Thistle 1000 MG CAPS Take 2 capsules by mouth daily.      Marland Kitchen omeprazole (PRILOSEC OTC) 20 MG tablet Take 20 mg by mouth daily.      . potassium chloride (K-DUR) 10 MEQ tablet Take 10 mEq by mouth daily.      . Vitamin D, Ergocalciferol, (DRISDOL) 50000 UNITS CAPS capsule Take 50,000 Units by mouth every 7 (seven) days.      Marland Kitchen zolpidem (AMBIEN) 5 MG tablet Take 1/2-1 tablet by mouth qhs prn       No facility-administered encounter medications on file as of 10/12/2013.     REVIEW OF SYSTEMS  : All other systems reviewed and negative except where noted in the History of Present Illness.   PHYSICAL  EXAM: BP 114/68  Pulse 76  Ht 5\' 1"  (1.549 m)  Wt 138 lb 6.4 oz (62.778 kg)  BMI 26.16 kg/m2 General: Well developed white female in no acute distress Head: Normocephalic and atraumatic Eyes:  Sclerae anicteric, conjunctiva pink. Ears: Normal auditory acuity Lungs:  Clear throughout to auscultation Heart: Regular rate and rhythm Abdomen: Soft, non-tender, non-distended. No masses or hepatomegaly noted. Normal bowel sounds. Musculoskeletal: Symmetrical with no gross deformities  Skin: No lesions on visible extremities Extremities: No edema  Neurological: Alert oriented x 4, grossly nonfocal Psychological:  Alert and cooperative. Normal mood and affect  ASSESSMENT AND PLAN: -67 year old female with symptoms of indigestion, belching, epigastric discomfort, and nausea.  Most likely secondary to GERD.  Will schedule EGD to rule out reflux versus ulcer disease versus malignancy and other etiologies. In the interim we will start her on omeprazole 40 mg twice a day. We'll also give her her dietary measures to follow. The risks, benefits, and alternatives were discussed with the patient and she consents to proceed.  I offered to prescribe her an anti-emetic at this time as well but she declined.

## 2013-10-12 NOTE — Patient Instructions (Signed)
You have been scheduled for an endoscopy with propofol. Please follow written instructions given to you at your visit today. If you use inhalers (even only as needed), please bring them with you on the day of your procedure. Your physician has requested that you go to www.startemmi.com and enter the access code given to you at your visit today. This web site gives a general overview about your procedure. However, you should still follow specific instructions given to you by our office regarding your preparation for the procedure.  We have sent the following medications to your pharmacy for you to pick up at your convenience: Omeprazole 40 mg twice daily  Gastroesophageal Reflux Disease, Adult Gastroesophageal reflux disease (GERD) happens when acid from your stomach flows up into the esophagus. When acid comes in contact with the esophagus, the acid causes soreness (inflammation) in the esophagus. Over time, GERD may create small holes (ulcers) in the lining of the esophagus. CAUSES   Increased body weight. This puts pressure on the stomach, making acid rise from the stomach into the esophagus.  Smoking. This increases acid production in the stomach.  Drinking alcohol. This causes decreased pressure in the lower esophageal sphincter (valve or ring of muscle between the esophagus and stomach), allowing acid from the stomach into the esophagus.  Late evening meals and a full stomach. This increases pressure and acid production in the stomach.  A malformed lower esophageal sphincter. Sometimes, no cause is found. SYMPTOMS   Burning pain in the lower part of the mid-chest behind the breastbone and in the mid-stomach area. This may occur twice a week or more often.  Trouble swallowing.  Sore throat.  Dry cough.  Asthma-like symptoms including chest tightness, shortness of breath, or wheezing. DIAGNOSIS  Your caregiver may be able to diagnose GERD based on your symptoms. In some cases,  X-rays and other tests may be done to check for complications or to check the condition of your stomach and esophagus. TREATMENT  Your caregiver may recommend over-the-counter or prescription medicines to help decrease acid production. Ask your caregiver before starting or adding any new medicines.  HOME CARE INSTRUCTIONS   Change the factors that you can control. Ask your caregiver for guidance concerning weight loss, quitting smoking, and alcohol consumption.  Avoid foods and drinks that make your symptoms worse, such as:  Caffeine or alcoholic drinks.  Chocolate.  Peppermint or mint flavorings.  Garlic and onions.  Spicy foods.  Citrus fruits, such as oranges, lemons, or limes.  Tomato-based foods such as sauce, chili, salsa, and pizza.  Fried and fatty foods.  Avoid lying down for the 3 hours prior to your bedtime or prior to taking a nap.  Eat small, frequent meals instead of large meals.  Wear loose-fitting clothing. Do not wear anything tight around your waist that causes pressure on your stomach.  Raise the head of your bed 6 to 8 inches with wood blocks to help you sleep. Extra pillows will not help.  Only take over-the-counter or prescription medicines for pain, discomfort, or fever as directed by your caregiver.  Do not take aspirin, ibuprofen, or other nonsteroidal anti-inflammatory drugs (NSAIDs). SEEK IMMEDIATE MEDICAL CARE IF:   You have pain in your arms, neck, jaw, teeth, or back.  Your pain increases or changes in intensity or duration.  You develop nausea, vomiting, or sweating (diaphoresis).  You develop shortness of breath, or you faint.  Your vomit is green, yellow, black, or looks like coffee grounds or blood.  Your stool is red, bloody, or black. These symptoms could be signs of other problems, such as heart disease, gastric bleeding, or esophageal bleeding. MAKE SURE YOU:   Understand these instructions.  Will watch your  condition.  Will get help right away if you are not doing well or get worse. Document Released: 09/22/2005 Document Revised: 03/06/2012 Document Reviewed: 07/02/2011 Exit[employer] Patient Information 2014 [employer], [place]. _______________________________________________________________________________  Diet for Gastroesophageal Reflux Disease, Adult Reflux (acid reflux) is when acid from your stomach flows up into the esophagus. When acid comes in contact with the esophagus, the acid causes irritation and soreness (inflammation) in the esophagus. When reflux happens often or so severely that it causes damage to the esophagus, it is called gastroesophageal reflux disease (GERD). Nutrition therapy can help ease the discomfort of GERD. FOODS OR DRINKS TO AVOID OR LIMIT  Smoking or chewing tobacco. Nicotine is one of the most potent stimulants to acid production in the gastrointestinal tract.  Caffeinated and decaffeinated coffee and black tea.  Regular or low-calorie carbonated beverages or energy drinks (caffeine-free carbonated beverages are allowed).   Strong spices, such as black pepper, white pepper, red pepper, cayenne, curry powder, and chili powder.  Peppermint or spearmint.  Chocolate.  High-fat foods, including meats and fried foods. Extra added fats including oils, butter, salad dressings, and nuts. Limit these to less than 8 tsp per day.  Fruits and vegetables if they are not tolerated, such as citrus fruits or tomatoes.  Alcohol.  Any food that seems to aggravate your condition. If you have questions regarding your diet, call your caregiver or a registered dietitian. OTHER THINGS THAT MAY HELP GERD INCLUDE:   Eating your meals slowly, in a relaxed setting.  Eating 5 to 6 small meals per day instead of 3 large meals.  Eliminating food for a period of time if it causes distress.  Not lying down until 3 hours after eating a meal.  Keeping the head of your bed raised 6 to 9  inches (15 to 23 cm) by using a foam wedge or blocks under the legs of the bed. Lying flat may make symptoms worse.  Being physically active. Weight loss may be helpful in reducing reflux in overweight or obese adults.  Wear loose fitting clothing EXAMPLE MEAL PLAN This meal plan is approximately 2,000 calories based on https://www.bernard.org/ meal planning guidelines. Breakfast   cup cooked oatmeal.  1 cup strawberries.  1 cup low-fat milk.  1 oz almonds. Snack  1 cup cucumber slices.  6 oz yogurt (made from low-fat or fat-free milk). Lunch  2 slice whole-wheat bread.  2 oz sliced [place].  2 tsp mayonnaise.  1 cup blueberries.  1 cup snap peas. Snack  6 whole-wheat crackers.  1 oz string cheese. Dinner   cup brown rice.  1 cup mixed veggies.  1 tsp olive oil.  3 oz grilled fish. Document Released: 12/13/2005 Document Revised: 03/06/2012 Document Reviewed: 10/29/2011 [facility] Patient Information 2014 [place], [place].

## 2013-10-19 ENCOUNTER — Encounter: Payer: Self-pay | Admitting: Internal Medicine

## 2013-10-19 ENCOUNTER — Ambulatory Visit (AMBULATORY_SURGERY_CENTER): Payer: Medicare Other | Admitting: Internal Medicine

## 2013-10-19 VITALS — BP 134/80 | HR 68 | Temp 96.2°F | Resp 19 | Ht 61.0 in | Wt 138.0 lb

## 2013-10-19 DIAGNOSIS — K2990 Gastroduodenitis, unspecified, without bleeding: Secondary | ICD-10-CM

## 2013-10-19 DIAGNOSIS — K219 Gastro-esophageal reflux disease without esophagitis: Secondary | ICD-10-CM | POA: Diagnosis not present

## 2013-10-19 DIAGNOSIS — K209 Esophagitis, unspecified without bleeding: Secondary | ICD-10-CM | POA: Diagnosis not present

## 2013-10-19 DIAGNOSIS — K2970 Gastritis, unspecified, without bleeding: Secondary | ICD-10-CM | POA: Diagnosis not present

## 2013-10-19 DIAGNOSIS — R110 Nausea: Secondary | ICD-10-CM

## 2013-10-19 DIAGNOSIS — E669 Obesity, unspecified: Secondary | ICD-10-CM | POA: Diagnosis not present

## 2013-10-19 DIAGNOSIS — I10 Essential (primary) hypertension: Secondary | ICD-10-CM | POA: Diagnosis not present

## 2013-10-19 HISTORY — PX: ESOPHAGOGASTRODUODENOSCOPY (EGD) WITH PROPOFOL: SHX5813

## 2013-10-19 MED ORDER — SODIUM CHLORIDE 0.9 % IV SOLN: 500.0000 mL | INTRAVENOUS | Status: DC

## 2013-10-19 NOTE — Op Note (Signed)
[facility] [address] [place], [address]   ENDOSCOPY PROCEDURE REPORT  PATIENT: Meghan Tucker, Meghan Tucker  MR#: [id number] BIRTHDATE: 17-Apr-1946 , 66  yrs. old GENDER: Female ENDOSCOPIST: Hart Carwin, MD REFERRED BY:  Morton Amy, M.D. PROCEDURE DATE:  10/19/2013 PROCEDURE:  EGD w/ biopsy ASA CLASS:     Class II INDICATIONS:  Nausea.   Epigastric pain.   12/2008 EGD- gastritis, sono and HIDA negative, known hiatal hernia. MEDICATIONS: MAC sedation, administered by CRNA and Propofol (Diprivan) 130 mg IV TOPICAL ANESTHETIC: none  DESCRIPTION OF PROCEDURE: After the risks benefits and alternatives of the procedure were thoroughly explained, informed consent was obtained.  The LB [id number] endoscope was introduced through the mouth and advanced to the second portion of the duodenum. Without limitations.  The instrument was slowly withdrawn as the mucosa was fully examined.      [Esophagus: gastric patch in proximal esophagus,  normal mucosa mid and distal esophagus, slightly irregular z-line- biopsies taken to r/o Barrett's esophagua. There was a 3 cm partially reducible hiatal hernia, no Cameron erosions Stomach: gastric mucosa appeared normal in the body of the stomach. There was a normal rugal pattern. There was mild erythema in the gastric antrum. Biopsies were taken to rule out H. pylori. Gastric outlet was normal. Retroflexion of the endoscope revealed normal fundus and cardia Duodenum duodenal bulb and descending duodenum was normal  The scope was then withdrawn from the patient and the procedure completed.  COMPLICATIONS: There were no complications. ENDOSCOPIC IMPRESSION: 3 cm partially reducible hiatal hernia without inflammatory changes. Irregular Z line status post biopsies to rule out Barrett's esophagus Minimal antral gastritis status post biopsies Patient's symptoms of epigastric and subxiphoid pain may be related to presence of a  sliding hiatal hernia  RECOMMENDATIONS:   Await biopsy results antireflux measures Continue Prilosec 40 mg daily Use antacids such as Gaviscon when necessary  REPEAT EXAM: for EGD pending biopsy results.  eSigned:  Hart Carwin, MD 10/19/2013 9:06 AM   CC:  PATIENT NAME:  Meghan Tucker, Meghan Tucker MR#: [id number]

## 2013-10-19 NOTE — Progress Notes (Signed)
Patient did not experience any of the following events: a burn prior to discharge; a fall within the facility; wrong site/side/patient/procedure/implant event; or a hospital transfer or hospital admission upon discharge from the facility. (G8907) Patient did not have preoperative order for IV antibiotic SSI prophylaxis. (G8918)  

## 2013-10-19 NOTE — Progress Notes (Signed)
Report to pacu rn, vss, bbs=clear 

## 2013-10-19 NOTE — Progress Notes (Signed)
Called to room to assist during endoscopic procedure.  Patient ID and intended procedure confirmed with present staff. Received instructions for my participation in the procedure from the performing physician.  

## 2013-10-19 NOTE — Progress Notes (Signed)
Sip of water at 645- CRNA aware

## 2013-10-19 NOTE — Patient Instructions (Signed)

## 2013-10-22 ENCOUNTER — Telehealth: Payer: Self-pay | Admitting: *Deleted

## 2013-10-22 NOTE — Telephone Encounter (Signed)
  Follow up Call-  Call back number 10/19/2013  Post procedure Call Back phone  # [phone]  Permission to leave phone message Yes     Patient questions:  Do you have a fever, pain , or abdominal swelling? no Pain Score  0 *  Have you tolerated food without any problems? yes  Have you been able to return to your normal activities? yes  Do you have any questions about your discharge instructions: Diet   no Medications  no Follow up visit  no  Do you have questions or concerns about your Care? no  Actions: * If pain score is 4 or above: No action needed, pain <4.

## 2013-10-23 ENCOUNTER — Encounter: Payer: Self-pay | Admitting: Internal Medicine

## 2013-10-30 DIAGNOSIS — Z419 Encounter for procedure for purposes other than remedying health state, unspecified: Secondary | ICD-10-CM | POA: Diagnosis not present

## 2013-11-08 DIAGNOSIS — H2589 Other age-related cataract: Secondary | ICD-10-CM | POA: Diagnosis not present

## 2013-11-30 DIAGNOSIS — E7800 Pure hypercholesterolemia, unspecified: Secondary | ICD-10-CM | POA: Diagnosis not present

## 2013-11-30 DIAGNOSIS — J309 Allergic rhinitis, unspecified: Secondary | ICD-10-CM | POA: Diagnosis not present

## 2013-11-30 DIAGNOSIS — Z9181 History of falling: Secondary | ICD-10-CM | POA: Diagnosis not present

## 2013-11-30 DIAGNOSIS — G4700 Insomnia, unspecified: Secondary | ICD-10-CM | POA: Diagnosis not present

## 2013-11-30 DIAGNOSIS — Z1331 Encounter for screening for depression: Secondary | ICD-10-CM | POA: Diagnosis not present

## 2013-11-30 DIAGNOSIS — M65839 Other synovitis and tenosynovitis, unspecified forearm: Secondary | ICD-10-CM | POA: Diagnosis not present

## 2013-12-05 DIAGNOSIS — L2089 Other atopic dermatitis: Secondary | ICD-10-CM | POA: Diagnosis not present

## 2013-12-05 DIAGNOSIS — D235 Other benign neoplasm of skin of trunk: Secondary | ICD-10-CM | POA: Diagnosis not present

## 2013-12-05 DIAGNOSIS — L821 Other seborrheic keratosis: Secondary | ICD-10-CM | POA: Diagnosis not present

## 2014-01-21 DIAGNOSIS — J069 Acute upper respiratory infection, unspecified: Secondary | ICD-10-CM | POA: Diagnosis not present

## 2014-02-04 DIAGNOSIS — R5383 Other fatigue: Secondary | ICD-10-CM | POA: Diagnosis not present

## 2014-02-04 DIAGNOSIS — R059 Cough, unspecified: Secondary | ICD-10-CM | POA: Diagnosis not present

## 2014-02-04 DIAGNOSIS — R0602 Shortness of breath: Secondary | ICD-10-CM | POA: Diagnosis not present

## 2014-02-04 DIAGNOSIS — R5381 Other malaise: Secondary | ICD-10-CM | POA: Diagnosis not present

## 2014-02-04 DIAGNOSIS — R05 Cough: Secondary | ICD-10-CM | POA: Diagnosis not present

## 2014-02-04 DIAGNOSIS — R0609 Other forms of dyspnea: Secondary | ICD-10-CM | POA: Diagnosis not present

## 2014-02-08 DIAGNOSIS — R5381 Other malaise: Secondary | ICD-10-CM | POA: Diagnosis not present

## 2014-02-15 DIAGNOSIS — J209 Acute bronchitis, unspecified: Secondary | ICD-10-CM | POA: Diagnosis not present

## 2014-02-27 DIAGNOSIS — R0982 Postnasal drip: Secondary | ICD-10-CM | POA: Diagnosis not present

## 2014-02-27 DIAGNOSIS — J329 Chronic sinusitis, unspecified: Secondary | ICD-10-CM | POA: Diagnosis not present

## 2014-02-27 DIAGNOSIS — J342 Deviated nasal septum: Secondary | ICD-10-CM | POA: Diagnosis not present

## 2014-02-27 DIAGNOSIS — J3489 Other specified disorders of nose and nasal sinuses: Secondary | ICD-10-CM | POA: Diagnosis not present

## 2014-03-11 DIAGNOSIS — Z1231 Encounter for screening mammogram for malignant neoplasm of breast: Secondary | ICD-10-CM | POA: Diagnosis not present

## 2014-03-11 DIAGNOSIS — J329 Chronic sinusitis, unspecified: Secondary | ICD-10-CM | POA: Diagnosis not present

## 2014-03-18 DIAGNOSIS — J342 Deviated nasal septum: Secondary | ICD-10-CM | POA: Diagnosis not present

## 2014-03-18 DIAGNOSIS — J329 Chronic sinusitis, unspecified: Secondary | ICD-10-CM | POA: Diagnosis not present

## 2014-03-18 DIAGNOSIS — J343 Hypertrophy of nasal turbinates: Secondary | ICD-10-CM | POA: Diagnosis not present

## 2014-04-05 DIAGNOSIS — M899 Disorder of bone, unspecified: Secondary | ICD-10-CM | POA: Diagnosis not present

## 2014-05-29 DIAGNOSIS — D239 Other benign neoplasm of skin, unspecified: Secondary | ICD-10-CM | POA: Diagnosis not present

## 2014-05-29 DIAGNOSIS — D1739 Benign lipomatous neoplasm of skin and subcutaneous tissue of other sites: Secondary | ICD-10-CM | POA: Diagnosis not present

## 2014-05-29 DIAGNOSIS — L821 Other seborrheic keratosis: Secondary | ICD-10-CM | POA: Diagnosis not present

## 2014-05-29 DIAGNOSIS — D2370 Other benign neoplasm of skin of unspecified lower limb, including hip: Secondary | ICD-10-CM | POA: Diagnosis not present

## 2014-05-29 DIAGNOSIS — L819 Disorder of pigmentation, unspecified: Secondary | ICD-10-CM | POA: Diagnosis not present

## 2014-05-30 DIAGNOSIS — IMO0001 Reserved for inherently not codable concepts without codable children: Secondary | ICD-10-CM | POA: Diagnosis not present

## 2014-05-30 DIAGNOSIS — E559 Vitamin D deficiency, unspecified: Secondary | ICD-10-CM | POA: Diagnosis not present

## 2014-05-30 DIAGNOSIS — E7800 Pure hypercholesterolemia, unspecified: Secondary | ICD-10-CM | POA: Diagnosis not present

## 2014-06-03 DIAGNOSIS — IMO0001 Reserved for inherently not codable concepts without codable children: Secondary | ICD-10-CM | POA: Diagnosis not present

## 2014-06-03 DIAGNOSIS — E559 Vitamin D deficiency, unspecified: Secondary | ICD-10-CM | POA: Diagnosis not present

## 2014-06-03 DIAGNOSIS — G5600 Carpal tunnel syndrome, unspecified upper limb: Secondary | ICD-10-CM | POA: Diagnosis not present

## 2014-06-03 DIAGNOSIS — G5760 Lesion of plantar nerve, unspecified lower limb: Secondary | ICD-10-CM | POA: Diagnosis not present

## 2014-06-03 DIAGNOSIS — K219 Gastro-esophageal reflux disease without esophagitis: Secondary | ICD-10-CM | POA: Diagnosis not present

## 2014-06-03 DIAGNOSIS — E7800 Pure hypercholesterolemia, unspecified: Secondary | ICD-10-CM | POA: Diagnosis not present

## 2014-06-03 DIAGNOSIS — M5430 Sciatica, unspecified side: Secondary | ICD-10-CM | POA: Diagnosis not present

## 2014-07-02 DIAGNOSIS — R7402 Elevation of levels of lactic acid dehydrogenase (LDH): Secondary | ICD-10-CM | POA: Diagnosis not present

## 2014-07-02 DIAGNOSIS — G5760 Lesion of plantar nerve, unspecified lower limb: Secondary | ICD-10-CM | POA: Diagnosis not present

## 2014-07-02 DIAGNOSIS — IMO0001 Reserved for inherently not codable concepts without codable children: Secondary | ICD-10-CM | POA: Diagnosis not present

## 2014-07-02 DIAGNOSIS — M5430 Sciatica, unspecified side: Secondary | ICD-10-CM | POA: Diagnosis not present

## 2014-07-02 DIAGNOSIS — K219 Gastro-esophageal reflux disease without esophagitis: Secondary | ICD-10-CM | POA: Diagnosis not present

## 2014-07-02 DIAGNOSIS — G5600 Carpal tunnel syndrome, unspecified upper limb: Secondary | ICD-10-CM | POA: Diagnosis not present

## 2014-07-02 DIAGNOSIS — E7800 Pure hypercholesterolemia, unspecified: Secondary | ICD-10-CM | POA: Diagnosis not present

## 2014-07-03 DIAGNOSIS — E7800 Pure hypercholesterolemia, unspecified: Secondary | ICD-10-CM | POA: Diagnosis not present

## 2014-07-03 DIAGNOSIS — R7402 Elevation of levels of lactic acid dehydrogenase (LDH): Secondary | ICD-10-CM | POA: Diagnosis not present

## 2014-07-25 DIAGNOSIS — G5600 Carpal tunnel syndrome, unspecified upper limb: Secondary | ICD-10-CM | POA: Diagnosis not present

## 2014-07-25 DIAGNOSIS — R209 Unspecified disturbances of skin sensation: Secondary | ICD-10-CM | POA: Diagnosis not present

## 2014-08-22 DIAGNOSIS — R0602 Shortness of breath: Secondary | ICD-10-CM | POA: Insufficient documentation

## 2014-08-22 DIAGNOSIS — M1990 Unspecified osteoarthritis, unspecified site: Secondary | ICD-10-CM

## 2014-08-22 DIAGNOSIS — G43909 Migraine, unspecified, not intractable, without status migrainosus: Secondary | ICD-10-CM | POA: Insufficient documentation

## 2014-08-22 DIAGNOSIS — R9431 Abnormal electrocardiogram [ECG] [EKG]: Secondary | ICD-10-CM | POA: Diagnosis not present

## 2014-08-22 DIAGNOSIS — G5760 Lesion of plantar nerve, unspecified lower limb: Secondary | ICD-10-CM | POA: Insufficient documentation

## 2014-08-22 DIAGNOSIS — IMO0001 Reserved for inherently not codable concepts without codable children: Secondary | ICD-10-CM | POA: Diagnosis not present

## 2014-08-22 DIAGNOSIS — E785 Hyperlipidemia, unspecified: Secondary | ICD-10-CM | POA: Diagnosis not present

## 2014-08-22 DIAGNOSIS — E559 Vitamin D deficiency, unspecified: Secondary | ICD-10-CM | POA: Insufficient documentation

## 2014-08-22 DIAGNOSIS — M8580 Other specified disorders of bone density and structure, unspecified site: Secondary | ICD-10-CM | POA: Insufficient documentation

## 2014-08-22 DIAGNOSIS — G5600 Carpal tunnel syndrome, unspecified upper limb: Secondary | ICD-10-CM | POA: Insufficient documentation

## 2014-08-22 DIAGNOSIS — E7800 Pure hypercholesterolemia, unspecified: Secondary | ICD-10-CM | POA: Diagnosis not present

## 2014-08-22 DIAGNOSIS — J45909 Unspecified asthma, uncomplicated: Secondary | ICD-10-CM | POA: Insufficient documentation

## 2014-08-22 HISTORY — DX: Vitamin D deficiency, unspecified: E55.9

## 2014-08-22 HISTORY — DX: Unspecified osteoarthritis, unspecified site: M19.90

## 2014-08-23 DIAGNOSIS — E7800 Pure hypercholesterolemia, unspecified: Secondary | ICD-10-CM

## 2014-08-23 DIAGNOSIS — M791 Myalgia, unspecified site: Secondary | ICD-10-CM | POA: Insufficient documentation

## 2014-08-23 HISTORY — DX: Pure hypercholesterolemia, unspecified: E78.00

## 2014-08-29 DIAGNOSIS — R945 Abnormal results of liver function studies: Secondary | ICD-10-CM | POA: Diagnosis not present

## 2014-08-29 DIAGNOSIS — R748 Abnormal levels of other serum enzymes: Secondary | ICD-10-CM | POA: Diagnosis not present

## 2014-09-10 DIAGNOSIS — Z01419 Encounter for gynecological examination (general) (routine) without abnormal findings: Secondary | ICD-10-CM | POA: Diagnosis not present

## 2014-09-17 DIAGNOSIS — D649 Anemia, unspecified: Secondary | ICD-10-CM | POA: Diagnosis not present

## 2014-09-30 DIAGNOSIS — H25813 Combined forms of age-related cataract, bilateral: Secondary | ICD-10-CM | POA: Diagnosis not present

## 2014-10-15 DIAGNOSIS — Z23 Encounter for immunization: Secondary | ICD-10-CM | POA: Diagnosis not present

## 2014-10-15 DIAGNOSIS — D649 Anemia, unspecified: Secondary | ICD-10-CM | POA: Diagnosis not present

## 2014-10-30 ENCOUNTER — Other Ambulatory Visit: Payer: Self-pay | Admitting: Dermatology

## 2014-10-30 DIAGNOSIS — L570 Actinic keratosis: Secondary | ICD-10-CM | POA: Diagnosis not present

## 2014-10-30 DIAGNOSIS — L821 Other seborrheic keratosis: Secondary | ICD-10-CM | POA: Diagnosis not present

## 2014-10-30 DIAGNOSIS — L820 Inflamed seborrheic keratosis: Secondary | ICD-10-CM | POA: Diagnosis not present

## 2014-10-30 DIAGNOSIS — L814 Other melanin hyperpigmentation: Secondary | ICD-10-CM | POA: Diagnosis not present

## 2014-10-30 DIAGNOSIS — D485 Neoplasm of uncertain behavior of skin: Secondary | ICD-10-CM | POA: Diagnosis not present

## 2014-11-15 DIAGNOSIS — M791 Myalgia: Secondary | ICD-10-CM | POA: Diagnosis not present

## 2014-11-15 DIAGNOSIS — E782 Mixed hyperlipidemia: Secondary | ICD-10-CM | POA: Diagnosis not present

## 2014-11-15 DIAGNOSIS — R945 Abnormal results of liver function studies: Secondary | ICD-10-CM | POA: Diagnosis not present

## 2014-12-21 DIAGNOSIS — R1031 Right lower quadrant pain: Secondary | ICD-10-CM | POA: Diagnosis not present

## 2014-12-21 DIAGNOSIS — M545 Low back pain: Secondary | ICD-10-CM | POA: Diagnosis not present

## 2014-12-21 DIAGNOSIS — R1033 Periumbilical pain: Secondary | ICD-10-CM | POA: Diagnosis not present

## 2014-12-21 DIAGNOSIS — K760 Fatty (change of) liver, not elsewhere classified: Secondary | ICD-10-CM | POA: Diagnosis not present

## 2014-12-21 DIAGNOSIS — N23 Unspecified renal colic: Secondary | ICD-10-CM | POA: Diagnosis not present

## 2014-12-24 DIAGNOSIS — N201 Calculus of ureter: Secondary | ICD-10-CM | POA: Diagnosis not present

## 2014-12-24 DIAGNOSIS — R109 Unspecified abdominal pain: Secondary | ICD-10-CM | POA: Diagnosis not present

## 2015-01-07 DIAGNOSIS — R5382 Chronic fatigue, unspecified: Secondary | ICD-10-CM | POA: Diagnosis not present

## 2015-01-07 DIAGNOSIS — D649 Anemia, unspecified: Secondary | ICD-10-CM | POA: Diagnosis not present

## 2015-01-07 DIAGNOSIS — M791 Myalgia: Secondary | ICD-10-CM | POA: Diagnosis not present

## 2015-01-07 DIAGNOSIS — D6489 Other specified anemias: Secondary | ICD-10-CM | POA: Diagnosis not present

## 2015-01-07 DIAGNOSIS — R5383 Other fatigue: Secondary | ICD-10-CM | POA: Diagnosis not present

## 2015-01-07 DIAGNOSIS — E559 Vitamin D deficiency, unspecified: Secondary | ICD-10-CM | POA: Diagnosis not present

## 2015-01-07 DIAGNOSIS — M150 Primary generalized (osteo)arthritis: Secondary | ICD-10-CM | POA: Diagnosis not present

## 2015-01-27 DIAGNOSIS — K1230 Oral mucositis (ulcerative), unspecified: Secondary | ICD-10-CM | POA: Diagnosis not present

## 2015-01-27 DIAGNOSIS — M25511 Pain in right shoulder: Secondary | ICD-10-CM | POA: Diagnosis not present

## 2015-01-29 DIAGNOSIS — M810 Age-related osteoporosis without current pathological fracture: Secondary | ICD-10-CM | POA: Diagnosis not present

## 2015-02-12 DIAGNOSIS — M25511 Pain in right shoulder: Secondary | ICD-10-CM | POA: Diagnosis not present

## 2015-02-12 DIAGNOSIS — M19011 Primary osteoarthritis, right shoulder: Secondary | ICD-10-CM | POA: Diagnosis not present

## 2015-02-12 DIAGNOSIS — M75101 Unspecified rotator cuff tear or rupture of right shoulder, not specified as traumatic: Secondary | ICD-10-CM | POA: Diagnosis not present

## 2015-02-17 DIAGNOSIS — M25511 Pain in right shoulder: Secondary | ICD-10-CM | POA: Diagnosis not present

## 2015-02-25 DIAGNOSIS — M5430 Sciatica, unspecified side: Secondary | ICD-10-CM | POA: Diagnosis not present

## 2015-02-25 DIAGNOSIS — M25511 Pain in right shoulder: Secondary | ICD-10-CM | POA: Diagnosis not present

## 2015-02-25 DIAGNOSIS — E780 Pure hypercholesterolemia: Secondary | ICD-10-CM | POA: Diagnosis not present

## 2015-02-25 DIAGNOSIS — E782 Mixed hyperlipidemia: Secondary | ICD-10-CM | POA: Diagnosis not present

## 2015-02-27 DIAGNOSIS — M545 Low back pain: Secondary | ICD-10-CM | POA: Diagnosis not present

## 2015-02-27 DIAGNOSIS — M47896 Other spondylosis, lumbar region: Secondary | ICD-10-CM | POA: Diagnosis not present

## 2015-02-27 DIAGNOSIS — M5126 Other intervertebral disc displacement, lumbar region: Secondary | ICD-10-CM | POA: Diagnosis not present

## 2015-02-27 DIAGNOSIS — M4316 Spondylolisthesis, lumbar region: Secondary | ICD-10-CM | POA: Diagnosis not present

## 2015-02-27 DIAGNOSIS — M4806 Spinal stenosis, lumbar region: Secondary | ICD-10-CM | POA: Diagnosis not present

## 2015-02-27 DIAGNOSIS — M5136 Other intervertebral disc degeneration, lumbar region: Secondary | ICD-10-CM | POA: Diagnosis not present

## 2015-03-05 DIAGNOSIS — L728 Other follicular cysts of the skin and subcutaneous tissue: Secondary | ICD-10-CM | POA: Diagnosis not present

## 2015-03-05 DIAGNOSIS — L570 Actinic keratosis: Secondary | ICD-10-CM | POA: Diagnosis not present

## 2015-03-12 DIAGNOSIS — Z23 Encounter for immunization: Secondary | ICD-10-CM | POA: Diagnosis not present

## 2015-03-13 DIAGNOSIS — Z1231 Encounter for screening mammogram for malignant neoplasm of breast: Secondary | ICD-10-CM | POA: Diagnosis not present

## 2015-03-19 DIAGNOSIS — M4316 Spondylolisthesis, lumbar region: Secondary | ICD-10-CM | POA: Diagnosis not present

## 2015-03-19 DIAGNOSIS — Z6826 Body mass index (BMI) 26.0-26.9, adult: Secondary | ICD-10-CM | POA: Diagnosis not present

## 2015-03-19 DIAGNOSIS — R030 Elevated blood-pressure reading, without diagnosis of hypertension: Secondary | ICD-10-CM | POA: Diagnosis not present

## 2015-03-31 DIAGNOSIS — N63 Unspecified lump in breast: Secondary | ICD-10-CM | POA: Diagnosis not present

## 2015-03-31 DIAGNOSIS — M25511 Pain in right shoulder: Secondary | ICD-10-CM | POA: Diagnosis not present

## 2015-03-31 DIAGNOSIS — N6001 Solitary cyst of right breast: Secondary | ICD-10-CM | POA: Diagnosis not present

## 2015-04-03 DIAGNOSIS — M6281 Muscle weakness (generalized): Secondary | ICD-10-CM | POA: Diagnosis not present

## 2015-04-03 DIAGNOSIS — M25511 Pain in right shoulder: Secondary | ICD-10-CM | POA: Diagnosis not present

## 2015-04-10 DIAGNOSIS — M25511 Pain in right shoulder: Secondary | ICD-10-CM | POA: Diagnosis not present

## 2015-04-10 DIAGNOSIS — M6281 Muscle weakness (generalized): Secondary | ICD-10-CM | POA: Diagnosis not present

## 2015-04-14 DIAGNOSIS — M159 Polyosteoarthritis, unspecified: Secondary | ICD-10-CM | POA: Diagnosis not present

## 2015-04-14 DIAGNOSIS — E780 Pure hypercholesterolemia: Secondary | ICD-10-CM | POA: Diagnosis not present

## 2015-04-14 DIAGNOSIS — J309 Allergic rhinitis, unspecified: Secondary | ICD-10-CM | POA: Diagnosis not present

## 2015-04-14 DIAGNOSIS — Z6826 Body mass index (BMI) 26.0-26.9, adult: Secondary | ICD-10-CM | POA: Diagnosis not present

## 2015-04-14 DIAGNOSIS — G4700 Insomnia, unspecified: Secondary | ICD-10-CM | POA: Diagnosis not present

## 2015-04-14 DIAGNOSIS — M659 Synovitis and tenosynovitis, unspecified: Secondary | ICD-10-CM | POA: Diagnosis not present

## 2015-04-15 DIAGNOSIS — M25511 Pain in right shoulder: Secondary | ICD-10-CM | POA: Diagnosis not present

## 2015-04-15 DIAGNOSIS — M6281 Muscle weakness (generalized): Secondary | ICD-10-CM | POA: Diagnosis not present

## 2015-04-17 DIAGNOSIS — M6281 Muscle weakness (generalized): Secondary | ICD-10-CM | POA: Diagnosis not present

## 2015-04-17 DIAGNOSIS — M25511 Pain in right shoulder: Secondary | ICD-10-CM | POA: Diagnosis not present

## 2015-04-23 DIAGNOSIS — M25511 Pain in right shoulder: Secondary | ICD-10-CM | POA: Diagnosis not present

## 2015-04-23 DIAGNOSIS — M6281 Muscle weakness (generalized): Secondary | ICD-10-CM | POA: Diagnosis not present

## 2015-04-29 DIAGNOSIS — M6281 Muscle weakness (generalized): Secondary | ICD-10-CM | POA: Diagnosis not present

## 2015-04-29 DIAGNOSIS — M25511 Pain in right shoulder: Secondary | ICD-10-CM | POA: Diagnosis not present

## 2015-05-08 ENCOUNTER — Encounter: Payer: Self-pay | Admitting: Internal Medicine

## 2015-05-08 ENCOUNTER — Ambulatory Visit (INDEPENDENT_AMBULATORY_CARE_PROVIDER_SITE_OTHER): Payer: Medicare Other | Admitting: Internal Medicine

## 2015-05-08 VITALS — BP 132/80 | HR 80 | Ht 60.5 in | Wt 136.0 lb

## 2015-05-08 DIAGNOSIS — T18128S Food in esophagus causing other injury, sequela: Secondary | ICD-10-CM | POA: Diagnosis not present

## 2015-05-08 DIAGNOSIS — R1310 Dysphagia, unspecified: Secondary | ICD-10-CM

## 2015-05-08 NOTE — Progress Notes (Signed)
Meghan Tucker 03-02-1946 [id number]  Note: This dictation was prepared with Dragon digital system. Any transcriptional errors that result from this procedure are unintentional.   History of Present Illness: This is a 69 year old white female who has experience food impaction several weeks ago while visiting [place]. She choked on a  steak but was able to cough it up after her son performed Hemlich maneuvre. She has a history of gastroesophageal reflux and was on PPIs but discontinued it because of recent reports of Alzheimer disease. Upper endoscopy in January 2010 showed gastritis and hiatal hernia as well as gastric patch in distal esophagus  and irregular Z line. Biopsies did not show Barrett's esophagus. She, at that time, had  negative upper abdominal ultrasound and HIDA scan. She had another episode of  food impaction  while eating broccoli but  was able tol cough it up. She does denies odynophagia. She denies nocturnal cough. She sleeps flat on her stomach.    Past Medical History  Diagnosis Date  . GERD (gastroesophageal reflux disease)   . Diverticulosis   . Arthritis   . Asthma   . Chronic headache   . Hyperlipidemia   . H. pylori infection 01/03/09  . Multinodular goiter   . Osteopenia   . Vitamin D deficiency   . Allergy     Past Surgical History  Procedure Laterality Date  . Carpal tunnel release Left     No Known Allergies  Family history and social history have been reviewed.  Review of Systems: Denies shortness of breath cough or hoarseness  The remainder of the 10 point ROS is negative except as outlined in the H&P  Physical Exam: General Appearance Well developed, in no distress Eyes  Non icteric  HEENT  Non traumatic, normocephalic  Mouth No lesion, tongue papillated, no cheilosis Neck Supple without adenopathy, thyroid not enlarged, no carotid bruits, no JVD Lungs Clear to auscultation bilaterally COR Normal S1, normal S2, regular rhythm, no murmur,  quiet precordium Abdomen soft nontender with normoactive bowel sounds Rectal not done Extremities  No pedal edema Skin No lesions Neurological Alert and oriented x 3 Psychological Normal mood and affect  Assessment and Plan:   69 year old white female with 2 episodes of solid food dysphagia and near food impaction which was dislodged before she started to go to emergency room. She has known hiatal hernia and I suspect has  a progression an esophageal  stricture. We will proceed with upper endoscopy and possible dilation. She will most likely have to go back on acid reducers preferably H2 receptor antagonists. In the meantime she will follow antireflux measures and try to chew well and avoid tough meats. We will plan to repeat biopsies to rule out Barrett's esophagus    Delfin Edis 05/08/2015

## 2015-05-08 NOTE — Patient Instructions (Addendum)
You have been scheduled for an endoscopy. Please follow written instructions given to you at your visit today. If you use inhalers (even only as needed), please bring them with you on the day of your procedure. Your physician has requested that you go to www.startemmi.com and enter the access code given to you at your visit today. This web site gives a general overview about your procedure. However, you should still follow specific instructions given to you by our office regarding your preparation for the procedure.  Meghan Tucker

## 2015-05-09 ENCOUNTER — Ambulatory Visit: Payer: Medicare Other | Admitting: Internal Medicine

## 2015-05-12 DIAGNOSIS — M25511 Pain in right shoulder: Secondary | ICD-10-CM | POA: Diagnosis not present

## 2015-05-14 ENCOUNTER — Encounter: Payer: Self-pay | Admitting: Internal Medicine

## 2015-05-14 ENCOUNTER — Ambulatory Visit (AMBULATORY_SURGERY_CENTER): Payer: Medicare Other | Admitting: Internal Medicine

## 2015-05-14 ENCOUNTER — Other Ambulatory Visit: Payer: Self-pay | Admitting: Internal Medicine

## 2015-05-14 VITALS — BP 145/83 | HR 76 | Temp 96.9°F | Resp 31 | Ht 60.0 in | Wt 136.0 lb

## 2015-05-14 DIAGNOSIS — K2990 Gastroduodenitis, unspecified, without bleeding: Secondary | ICD-10-CM | POA: Diagnosis not present

## 2015-05-14 DIAGNOSIS — R1310 Dysphagia, unspecified: Secondary | ICD-10-CM | POA: Diagnosis not present

## 2015-05-14 DIAGNOSIS — K219 Gastro-esophageal reflux disease without esophagitis: Secondary | ICD-10-CM

## 2015-05-14 DIAGNOSIS — K2950 Unspecified chronic gastritis without bleeding: Secondary | ICD-10-CM | POA: Diagnosis not present

## 2015-05-14 DIAGNOSIS — K2970 Gastritis, unspecified, without bleeding: Secondary | ICD-10-CM

## 2015-05-14 DIAGNOSIS — R110 Nausea: Secondary | ICD-10-CM | POA: Diagnosis not present

## 2015-05-14 DIAGNOSIS — J45909 Unspecified asthma, uncomplicated: Secondary | ICD-10-CM | POA: Diagnosis not present

## 2015-05-14 HISTORY — PX: ESOPHAGOGASTRODUODENOSCOPY (EGD) WITH ESOPHAGEAL DILATION: SHX5812

## 2015-05-14 MED ORDER — SODIUM CHLORIDE 0.9 % IV SOLN: 500.0000 mL | INTRAVENOUS | Status: DC

## 2015-05-14 NOTE — Progress Notes (Signed)
Stable to RR 

## 2015-05-14 NOTE — Progress Notes (Signed)
Called to room to assist during endoscopic procedure.  Patient ID and intended procedure confirmed with present staff. Received instructions for my participation in the procedure from the performing physician.  

## 2015-05-14 NOTE — Op Note (Signed)
[facility]  [address]. [place], [address]   ENDOSCOPY PROCEDURE REPORT  PATIENT: Meghan Tucker, Meghan Tucker  MR#: [id number] BIRTHDATE: March 20, 1946 , 68  yrs. old GENDER: female ENDOSCOPIST: Lafayette Dragon, MD REFERRED BY:  Karren Cobble, M.D. PROCEDURE DATE:  05/14/2015 PROCEDURE:  EGD w/ biopsy and EGD w/ wire guided (savary) dilation  ASA CLASS:     Class II INDICATIONS:  Dysphagia to solids.  At least 2 episodes of food impaction which resolved spontaneously.  Prior endoscopy in January 2010 showed hiatal hernia irregular Z line and mild gastritis. MEDICATIONS: Monitored anesthesia care and Propofol 140 mg IV TOPICAL ANESTHETIC: none  DESCRIPTION OF PROCEDURE: After the risks benefits and alternatives of the procedure were thoroughly explained, informed consent was obtained.  The LB [id number] endoscope was introduced through the mouth and advanced to the second portion of the duodenum , Without limitations.  The instrument was slowly withdrawn as the mucosa was fully examined.    Esophagus: proximal mid and distal esophageal mucosa was normal. Squamous, junction was only slightly irregular. There was mild stricture distally which allowed the endoscope to traverse without resistance. There was no acute esophagitis  Stomach: there was a small reducible hiatal hernia measuring 2-3 cm. Gastric folds were normal. There was multiple erosions in the gastric antrum some of them circular and some of them linear. These were biopsies to rule out H. pylori. The gastric outlet was normal. The retroflexion of endoscope revealed normal fundus and cardia  Duodenum: duodenal bulb and descending duodenum was normal  Guidewire was inserted through the endoscope into the stomach and Savary dilators passed over the guidewire starting with the 14, 15 and 16 mm dilators. There was blood on the last dilator[ The scope was then withdrawn from the patient and the  procedure completed.  COMPLICATIONS: There were no immediate complications.  ENDOSCOPIC IMPRESSION: 1. mild distal esophageal stricture status post dilation from 14-16 mm 2. Small reducible hiatal hernia 3. Mild-to-moderate antral gastritis. Status post biopsies to rule out H. pylori  RECOMMENDATIONS: 1.  Await pathology results 2.  Anti-reflux regimen to be follow 3.  Continue PPI  REPEAT EXAM: pending path report  eSigned:  Lafayette Dragon, MD 05/14/2015 8:28 AM    CC:  PATIENT NAME:  Meghan Tucker, Meghan Tucker MR#: [id number]

## 2015-05-14 NOTE — Patient Instructions (Addendum)
YOU HAD AN ENDOSCOPIC PROCEDURE TODAY AT [facility]:   Refer to the procedure report that was given to you for any specific questions about what was found during the examination.  If the procedure report does not answer your questions, please call your gastroenterologist to clarify.  If you requested that your care partner not be given the details of your procedure findings, then the procedure report has been included in a sealed envelope for you to review at your convenience later.  YOU SHOULD EXPECT: Some feelings of bloating in the abdomen. Passage of more gas than usual.  Walking can help get rid of the air that was put into your GI tract during the procedure and reduce the bloating. If you had a lower endoscopy (such as a colonoscopy or flexible sigmoidoscopy) you may notice spotting of blood in your stool or on the toilet paper. If you underwent a bowel prep for your procedure, you may not have a normal bowel movement for a few days.  Please Note:  You might notice some irritation and congestion in your nose or some drainage.  This is from the oxygen used during your procedure.  There is no need for concern and it should clear up in a day or so.  SYMPTOMS TO REPORT IMMEDIATELY:   Following upper endoscopy (EGD)  Vomiting of blood or coffee ground material  New chest pain or pain under the shoulder blades  Painful or persistently difficult swallowing  New shortness of breath  Fever of 100F or higher  Black, tarry-looking stools  For urgent or emergent issues, a gastroenterologist can be reached at any hour by calling [phone].   DIET: Your first meal following the procedure should be a small meal and then it is ok to progress to your normal diet. Heavy or fried foods are harder to digest and may make you feel nauseous or bloated.  Likewise, meals heavy in dairy and vegetables can increase bloating.  Drink plenty of fluids but you should avoid alcoholic beverages for  24 hours.  ACTIVITY:  You should plan to take it easy for the rest of today and you should NOT DRIVE or use heavy machinery until tomorrow (because of the sedation medicines used during the test).    FOLLOW UP: Our staff will call the number listed on your records the next business day following your procedure to check on you and address any questions or concerns that you may have regarding the information given to you following your procedure. If we do not reach you, we will leave a message.  However, if you are feeling well and you are not experiencing any problems, there is no need to return our call.  We will assume that you have returned to your regular daily activities without incident.  If any biopsies were taken you will be contacted by phone or by letter within the next 1-3 weeks.  Please call us at [phone] if you have not heard about the biopsies in 3 weeks.    SIGNATURES/CONFIDENTIALITY: You and/or your care partner have signed paperwork which will be entered into your electronic medical record.  These signatures attest to the fact that that the information above on your After Visit Summary has been reviewed and is understood.  Full responsibility of the confidentiality of this discharge information lies with you and/or your care-partner.  Post dilation diet given.  Gastritis information given.  Anti reflux regimen and hiatal hernia information given.

## 2015-05-15 ENCOUNTER — Telehealth: Payer: Self-pay | Admitting: *Deleted

## 2015-05-15 NOTE — Telephone Encounter (Signed)
  Follow up Call-  Call back number 05/14/2015 10/19/2013  Post procedure Call Back phone  # [phone]  Permission to leave phone message Yes Yes     Patient questions:  Do you have a fever, pain , or abdominal swelling? No. Pain Score  0 *  Have you tolerated food without any problems? Yes.    Have you been able to return to your normal activities? Yes.    Do you have any questions about your discharge instructions: Diet   No. Medications  No. Follow up visit  No.  Do you have questions or concerns about your Care? No.  Actions: * If pain score is 4 or above: No action needed, pain <4.

## 2015-05-22 ENCOUNTER — Encounter: Payer: Self-pay | Admitting: Internal Medicine

## 2015-06-02 DIAGNOSIS — J018 Other acute sinusitis: Secondary | ICD-10-CM | POA: Diagnosis not present

## 2015-06-02 DIAGNOSIS — R0602 Shortness of breath: Secondary | ICD-10-CM | POA: Diagnosis not present

## 2015-06-02 DIAGNOSIS — Z1211 Encounter for screening for malignant neoplasm of colon: Secondary | ICD-10-CM | POA: Diagnosis not present

## 2015-06-02 DIAGNOSIS — M859 Disorder of bone density and structure, unspecified: Secondary | ICD-10-CM | POA: Diagnosis not present

## 2015-06-02 DIAGNOSIS — Z0000 Encounter for general adult medical examination without abnormal findings: Secondary | ICD-10-CM | POA: Diagnosis not present

## 2015-06-02 DIAGNOSIS — F5101 Primary insomnia: Secondary | ICD-10-CM | POA: Diagnosis not present

## 2015-06-03 ENCOUNTER — Ambulatory Visit: Payer: Medicare Other | Admitting: Internal Medicine

## 2015-06-11 DIAGNOSIS — M2042 Other hammer toe(s) (acquired), left foot: Secondary | ICD-10-CM | POA: Diagnosis not present

## 2015-06-11 DIAGNOSIS — G5762 Lesion of plantar nerve, left lower limb: Secondary | ICD-10-CM | POA: Diagnosis not present

## 2015-06-13 ENCOUNTER — Other Ambulatory Visit: Payer: Self-pay | Admitting: Orthopedic Surgery

## 2015-06-20 DIAGNOSIS — M4316 Spondylolisthesis, lumbar region: Secondary | ICD-10-CM | POA: Diagnosis not present

## 2015-06-20 DIAGNOSIS — M545 Low back pain: Secondary | ICD-10-CM | POA: Diagnosis not present

## 2015-06-20 DIAGNOSIS — M47816 Spondylosis without myelopathy or radiculopathy, lumbar region: Secondary | ICD-10-CM | POA: Diagnosis not present

## 2015-06-20 DIAGNOSIS — M5416 Radiculopathy, lumbar region: Secondary | ICD-10-CM | POA: Diagnosis not present

## 2015-06-26 DIAGNOSIS — C44722 Squamous cell carcinoma of skin of right lower limb, including hip: Secondary | ICD-10-CM | POA: Diagnosis not present

## 2015-07-09 DIAGNOSIS — M8589 Other specified disorders of bone density and structure, multiple sites: Secondary | ICD-10-CM | POA: Diagnosis not present

## 2015-07-10 DIAGNOSIS — R0602 Shortness of breath: Secondary | ICD-10-CM | POA: Diagnosis not present

## 2015-07-10 DIAGNOSIS — E049 Nontoxic goiter, unspecified: Secondary | ICD-10-CM | POA: Diagnosis not present

## 2015-07-10 DIAGNOSIS — R911 Solitary pulmonary nodule: Secondary | ICD-10-CM | POA: Diagnosis not present

## 2015-07-11 DIAGNOSIS — E041 Nontoxic single thyroid nodule: Secondary | ICD-10-CM | POA: Diagnosis not present

## 2015-07-14 ENCOUNTER — Encounter (HOSPITAL_BASED_OUTPATIENT_CLINIC_OR_DEPARTMENT_OTHER): Payer: Self-pay | Admitting: *Deleted

## 2015-07-16 DIAGNOSIS — E042 Nontoxic multinodular goiter: Secondary | ICD-10-CM | POA: Diagnosis not present

## 2015-07-16 DIAGNOSIS — E780 Pure hypercholesterolemia: Secondary | ICD-10-CM | POA: Diagnosis not present

## 2015-07-16 DIAGNOSIS — M791 Myalgia: Secondary | ICD-10-CM | POA: Diagnosis not present

## 2015-07-17 ENCOUNTER — Encounter (HOSPITAL_BASED_OUTPATIENT_CLINIC_OR_DEPARTMENT_OTHER)
Admission: RE | Disposition: A | Discharge status: Home / Self Care | Payer: Self-pay | Source: Ambulatory Visit | Attending: Orthopedic Surgery

## 2015-07-17 ENCOUNTER — Ambulatory Visit (HOSPITAL_BASED_OUTPATIENT_CLINIC_OR_DEPARTMENT_OTHER): Payer: Medicare Other | Admitting: Certified Registered"

## 2015-07-17 ENCOUNTER — Encounter (HOSPITAL_BASED_OUTPATIENT_CLINIC_OR_DEPARTMENT_OTHER): Payer: Self-pay | Admitting: *Deleted

## 2015-07-17 ENCOUNTER — Ambulatory Visit (HOSPITAL_BASED_OUTPATIENT_CLINIC_OR_DEPARTMENT_OTHER)
Admission: RE | Admit: 2015-07-17 | Discharge: 2015-07-17 | Disposition: A | Discharge status: Home / Self Care | Payer: Medicare Other | Source: Ambulatory Visit | Attending: Orthopedic Surgery | Admitting: Orthopedic Surgery

## 2015-07-17 DIAGNOSIS — G8918 Other acute postprocedural pain: Secondary | ICD-10-CM | POA: Diagnosis not present

## 2015-07-17 DIAGNOSIS — J302 Other seasonal allergic rhinitis: Secondary | ICD-10-CM | POA: Insufficient documentation

## 2015-07-17 DIAGNOSIS — M7742 Metatarsalgia, left foot: Secondary | ICD-10-CM | POA: Diagnosis not present

## 2015-07-17 DIAGNOSIS — E559 Vitamin D deficiency, unspecified: Secondary | ICD-10-CM | POA: Diagnosis not present

## 2015-07-17 DIAGNOSIS — E785 Hyperlipidemia, unspecified: Secondary | ICD-10-CM | POA: Insufficient documentation

## 2015-07-17 DIAGNOSIS — Z79899 Other long term (current) drug therapy: Secondary | ICD-10-CM | POA: Diagnosis not present

## 2015-07-17 DIAGNOSIS — M79672 Pain in left foot: Secondary | ICD-10-CM | POA: Diagnosis present

## 2015-07-17 DIAGNOSIS — M1990 Unspecified osteoarthritis, unspecified site: Secondary | ICD-10-CM | POA: Insufficient documentation

## 2015-07-17 DIAGNOSIS — K219 Gastro-esophageal reflux disease without esophagitis: Secondary | ICD-10-CM | POA: Diagnosis not present

## 2015-07-17 DIAGNOSIS — M2042 Other hammer toe(s) (acquired), left foot: Secondary | ICD-10-CM | POA: Diagnosis not present

## 2015-07-17 DIAGNOSIS — M79675 Pain in left toe(s): Secondary | ICD-10-CM | POA: Diagnosis not present

## 2015-07-17 DIAGNOSIS — M8580 Other specified disorders of bone density and structure, unspecified site: Secondary | ICD-10-CM | POA: Insufficient documentation

## 2015-07-17 HISTORY — PX: HAMMERTOE RECONSTRUCTION WITH WEIL OSTEOTOMY: SHX5631

## 2015-07-17 HISTORY — DX: Personal history of urinary calculi: Z87.442

## 2015-07-17 HISTORY — DX: Sciatica, unspecified side: M54.30

## 2015-07-17 HISTORY — DX: Personal history of other diseases of the digestive system: Z87.19

## 2015-07-17 LAB — POCT HEMOGLOBIN-HEMACUE: Hemoglobin: 12.4 g/dL (ref 12.0–15.0)

## 2015-07-17 SURGERY — HAMMERTOE RECONSTRUCTION WITH WEIL OSTEOTOMY
Anesthesia: General | Site: Toe | Laterality: Left

## 2015-07-17 MED ORDER — FENTANYL CITRATE (PF) 100 MCG/2ML IJ SOLN
50.0000 ug | INTRAMUSCULAR | Status: DC | PRN
  Administered 2015-07-17: 75 ug via INTRAVENOUS

## 2015-07-17 MED ORDER — CEFAZOLIN SODIUM-DEXTROSE 2-3 GM-% IV SOLR
2.0000 g | INTRAVENOUS | Status: AC
  Administered 2015-07-17: 2 g via INTRAVENOUS

## 2015-07-17 MED ORDER — CHLORHEXIDINE GLUCONATE 4 % EX LIQD: 60.0000 mL | Freq: Once | CUTANEOUS | Status: DC

## 2015-07-17 MED ORDER — DOCUSATE SODIUM 100 MG PO CAPS: 100.0000 mg | ORAL_CAPSULE | Freq: Two times a day (BID) | ORAL | Status: DC

## 2015-07-17 MED ORDER — MIDAZOLAM HCL 2 MG/2ML IJ SOLN
INTRAMUSCULAR | Status: AC
  Filled 2015-07-17: qty 2

## 2015-07-17 MED ORDER — HYDROMORPHONE HCL 1 MG/ML IJ SOLN
0.2500 mg | INTRAMUSCULAR | Status: DC | PRN
  Administered 2015-07-17: 0.5 mg via INTRAVENOUS

## 2015-07-17 MED ORDER — LIDOCAINE HCL (CARDIAC) 20 MG/ML IV SOLN
INTRAVENOUS | Status: DC | PRN
  Administered 2015-07-17: 90 mg via INTRAVENOUS

## 2015-07-17 MED ORDER — LIDOCAINE-EPINEPHRINE (PF) 1.5 %-1:200000 IJ SOLN
INTRAMUSCULAR | Status: DC | PRN
  Administered 2015-07-17: 15 mL

## 2015-07-17 MED ORDER — LACTATED RINGERS IV SOLN
INTRAVENOUS | Status: DC
  Administered 2015-07-17 (×2): via INTRAVENOUS

## 2015-07-17 MED ORDER — PROPOFOL 10 MG/ML IV BOLUS
INTRAVENOUS | Status: DC | PRN
  Administered 2015-07-17: 200 mg via INTRAVENOUS

## 2015-07-17 MED ORDER — BACITRACIN ZINC 500 UNIT/GM EX OINT
TOPICAL_OINTMENT | CUTANEOUS | Status: AC
  Filled 2015-07-17: qty 28.35

## 2015-07-17 MED ORDER — OXYCODONE HCL 5 MG PO TABS: 5.0000 mg | ORAL_TABLET | ORAL | Status: DC | PRN

## 2015-07-17 MED ORDER — BUPIVACAINE-EPINEPHRINE (PF) 0.5% -1:200000 IJ SOLN
INTRAMUSCULAR | Status: AC
  Filled 2015-07-17: qty 1.8

## 2015-07-17 MED ORDER — SCOPOLAMINE 1 MG/3DAYS TD PT72: 1.0000 | MEDICATED_PATCH | Freq: Once | TRANSDERMAL | Status: DC | PRN

## 2015-07-17 MED ORDER — OXYCODONE HCL 5 MG PO TABS
ORAL_TABLET | ORAL | Status: AC
  Filled 2015-07-17: qty 1

## 2015-07-17 MED ORDER — OXYCODONE HCL 5 MG PO TABS
5.0000 mg | ORAL_TABLET | ORAL | Status: DC | PRN
  Administered 2015-07-17: 5 mg via ORAL

## 2015-07-17 MED ORDER — ONDANSETRON HCL 4 MG/2ML IJ SOLN
INTRAMUSCULAR | Status: DC | PRN
  Administered 2015-07-17: 4 mg via INTRAVENOUS

## 2015-07-17 MED ORDER — BUPIVACAINE-EPINEPHRINE (PF) 0.5% -1:200000 IJ SOLN
INTRAMUSCULAR | Status: AC
  Filled 2015-07-17: qty 30

## 2015-07-17 MED ORDER — SENNA 8.6 MG PO TABS: 2.0000 | ORAL_TABLET | Freq: Two times a day (BID) | ORAL | Status: DC

## 2015-07-17 MED ORDER — MIDAZOLAM HCL 2 MG/2ML IJ SOLN
1.0000 mg | INTRAMUSCULAR | Status: DC | PRN
  Administered 2015-07-17: 1 mg via INTRAVENOUS

## 2015-07-17 MED ORDER — SODIUM CHLORIDE 0.9 % IR SOLN
Status: DC | PRN
  Administered 2015-07-17: 200 mL

## 2015-07-17 MED ORDER — FENTANYL CITRATE (PF) 100 MCG/2ML IJ SOLN
INTRAMUSCULAR | Status: AC
Start: 2015-07-17 — End: 2015-07-17
  Filled 2015-07-17: qty 2

## 2015-07-17 MED ORDER — PROMETHAZINE HCL 25 MG/ML IJ SOLN: 6.2500 mg | INTRAMUSCULAR | Status: DC | PRN

## 2015-07-17 MED ORDER — FENTANYL CITRATE (PF) 100 MCG/2ML IJ SOLN
INTRAMUSCULAR | Status: AC
  Filled 2015-07-17: qty 6

## 2015-07-17 MED ORDER — HYDROMORPHONE HCL 1 MG/ML IJ SOLN
INTRAMUSCULAR | Status: AC
  Filled 2015-07-17: qty 1

## 2015-07-17 MED ORDER — GLYCOPYRROLATE 0.2 MG/ML IJ SOLN: 0.2000 mg | Freq: Once | INTRAMUSCULAR | Status: DC | PRN

## 2015-07-17 MED ORDER — SODIUM CHLORIDE 0.9 % IV SOLN: INTRAVENOUS | Status: DC

## 2015-07-17 MED ORDER — DEXAMETHASONE SODIUM PHOSPHATE 10 MG/ML IJ SOLN
INTRAMUSCULAR | Status: DC | PRN
  Administered 2015-07-17: 10 mg via INTRAVENOUS

## 2015-07-17 SURGICAL SUPPLY — 66 items
0.62 K WIRE ×2 IMPLANT
BANDAGE ESMARK 6X9 LF (GAUZE/BANDAGES/DRESSINGS) ×1 IMPLANT
BLADE AVERAGE 25X9 (BLADE) ×2 IMPLANT
BLADE OSC/SAG .038X5.5 CUT EDG (BLADE) ×2 IMPLANT
BLADE SURG 15 STRL LF DISP TIS (BLADE) ×2 IMPLANT
BLADE SURG 15 STRL SS (BLADE) ×2
BNDG COHESIVE 4X5 TAN STRL (GAUZE/BANDAGES/DRESSINGS) ×2 IMPLANT
BNDG CONFORM 2 STRL LF (GAUZE/BANDAGES/DRESSINGS) IMPLANT
BNDG CONFORM 3 STRL LF (GAUZE/BANDAGES/DRESSINGS) ×2 IMPLANT
BNDG ESMARK 4X9 LF (GAUZE/BANDAGES/DRESSINGS) IMPLANT
BNDG ESMARK 6X9 LF (GAUZE/BANDAGES/DRESSINGS) ×2
CAP PIN PROTECTOR ORTHO WHT (CAP) IMPLANT
CHLORAPREP W/TINT 26ML (MISCELLANEOUS) ×2 IMPLANT
COVER BACK TABLE 60X90IN (DRAPES) ×2 IMPLANT
CUFF TOURNIQUET SINGLE 34IN LL (TOURNIQUET CUFF) ×2 IMPLANT
DRAPE EXTREMITY T 121X128X90 (DRAPE) ×2 IMPLANT
DRAPE OEC MINIVIEW 54X84 (DRAPES) ×2 IMPLANT
DRAPE U-SHAPE 47X51 STRL (DRAPES) ×2 IMPLANT
DRSG EMULSION OIL 3X3 NADH (GAUZE/BANDAGES/DRESSINGS) IMPLANT
DRSG MEPITEL 4X7.2 (GAUZE/BANDAGES/DRESSINGS) ×2 IMPLANT
DRSG PAD ABDOMINAL 8X10 ST (GAUZE/BANDAGES/DRESSINGS) IMPLANT
ELECT REM PT RETURN 9FT ADLT (ELECTROSURGICAL) ×2
ELECTRODE REM PT RTRN 9FT ADLT (ELECTROSURGICAL) ×1 IMPLANT
GAUZE SPONGE 4X4 12PLY STRL (GAUZE/BANDAGES/DRESSINGS) ×2 IMPLANT
GLOVE BIO SURGEON STRL SZ8 (GLOVE) ×2 IMPLANT
GLOVE BIOGEL PI IND STRL 7.0 (GLOVE) ×1 IMPLANT
GLOVE BIOGEL PI IND STRL 8 (GLOVE) ×2 IMPLANT
GLOVE BIOGEL PI INDICATOR 7.0 (GLOVE) ×1
GLOVE BIOGEL PI INDICATOR 8 (GLOVE) ×2
GLOVE ECLIPSE 6.5 STRL STRAW (GLOVE) ×2 IMPLANT
GLOVE ECLIPSE 7.5 STRL STRAW (GLOVE) ×2 IMPLANT
GLOVE EXAM NITRILE MD LF STRL (GLOVE) ×2 IMPLANT
GOWN STRL REUS W/ TWL LRG LVL3 (GOWN DISPOSABLE) ×1 IMPLANT
GOWN STRL REUS W/ TWL XL LVL3 (GOWN DISPOSABLE) ×2 IMPLANT
GOWN STRL REUS W/TWL LRG LVL3 (GOWN DISPOSABLE) ×1
GOWN STRL REUS W/TWL XL LVL3 (GOWN DISPOSABLE) ×2
K-WIRE .054X4 (WIRE) IMPLANT
NEEDLE HYPO 22GX1.5 SAFETY (NEEDLE) IMPLANT
NS IRRIG 1000ML POUR BTL (IV SOLUTION) ×2 IMPLANT
PACK BASIN DAY SURGERY FS (CUSTOM PROCEDURE TRAY) ×2 IMPLANT
PAD CAST 4YDX4 CTTN HI CHSV (CAST SUPPLIES) ×1 IMPLANT
PADDING CAST ABS 4INX4YD NS (CAST SUPPLIES) ×1
PADDING CAST ABS COTTON 4X4 ST (CAST SUPPLIES) ×1 IMPLANT
PADDING CAST COTTON 4X4 STRL (CAST SUPPLIES) ×1
PASSER SUT SWANSON 36MM LOOP (INSTRUMENTS) IMPLANT
PENCIL BUTTON HOLSTER BLD 10FT (ELECTRODE) ×2 IMPLANT
SANITIZER HAND PURELL 535ML FO (MISCELLANEOUS) ×2 IMPLANT
SCREW ACUTRAK FUSION 24MM (Screw) ×4 IMPLANT
SCREW QUICK SNAP 2.0X12MM (Screw) ×2 IMPLANT
SCREW QUICK SNAP 2.0X14MM (Screw) ×2 IMPLANT
SHEET MEDIUM DRAPE 40X70 STRL (DRAPES) ×2 IMPLANT
SLEEVE SCD COMPRESS KNEE MED (MISCELLANEOUS) ×2 IMPLANT
SPONGE LAP 18X18 X RAY DECT (DISPOSABLE) ×2 IMPLANT
STOCKINETTE 6  STRL (DRAPES) ×1
STOCKINETTE 6 STRL (DRAPES) ×1 IMPLANT
SUCTION FRAZIER TIP 10 FR DISP (SUCTIONS) ×2 IMPLANT
SUT ETHILON 3 0 PS 1 (SUTURE) ×2 IMPLANT
SUT MNCRL AB 3-0 PS2 18 (SUTURE) ×2 IMPLANT
SUT VIC AB 2-0 SH 27 (SUTURE)
SUT VIC AB 2-0 SH 27XBRD (SUTURE) IMPLANT
SYR BULB 3OZ (MISCELLANEOUS) ×2 IMPLANT
SYR CONTROL 10ML LL (SYRINGE) IMPLANT
TOWEL OR 17X24 6PK STRL BLUE (TOWEL DISPOSABLE) ×4 IMPLANT
TUBE CONNECTING 20X1/4 (TUBING) ×2 IMPLANT
UNDERPAD 30X30 (UNDERPADS AND DIAPERS) ×2 IMPLANT
YANKAUER SUCT BULB TIP NO VENT (SUCTIONS) IMPLANT

## 2015-07-17 NOTE — Anesthesia Procedure Notes (Addendum)
Anesthesia Regional Block:  Ankle block  Pre-Anesthetic Checklist: ,, timeout performed, Correct Patient, Correct Site, Correct Laterality, Correct Procedure, Correct Position, site marked, Risks and benefits discussed,  Surgical consent,  Pre-op evaluation,  At surgeon's request and post-op pain management  Laterality: Lower and Left  Prep: chloraprep       Needles:      Needle Length: 4cm 4 cm Needle Gauge: 25 and 25 G  Needle insertion depth: 2 cm   Additional Needles: Ankle block Narrative:  Start time: 07/17/2015 11:20 AM End time: 07/17/2015 11:34 AM Injection made incrementally with aspirations every 5 mL. Anesthesiologist: MASSAGEE, TERRY  Additional Notes: Ankle block, 15 cc around ankle   Procedure Name: LMA Insertion Date/Time: 07/17/2015 11:53 AM Performed by: Jaydenn Boccio D Pre-anesthesia Checklist: Patient identified, Emergency Drugs available, Suction available and Patient being monitored Patient Re-evaluated:Patient Re-evaluated prior to inductionOxygen Delivery Method: Circle System Utilized Preoxygenation: Pre-oxygenation with 100% oxygen Intubation Type: IV induction Ventilation: Mask ventilation without difficulty LMA: LMA inserted LMA Size: 4.0 Number of attempts: 1 Airway Equipment and Method: Bite block Placement Confirmation: positive ETCO2 Tube secured with: Tape Dental Injury: Teeth and Oropharynx as per pre-operative assessment

## 2015-07-17 NOTE — Brief Op Note (Signed)
07/17/2015  12:39 PM  PATIENT:  Meghan Tucker  69 y.o. 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  Left 2nd and 3rd hammertoe corrections through separate incisions 2.  Left 2nd and 3rd MT weil osteotomies through separate incisions 3.  AP and lateral xrays of the left foot  SURGEON:  [name], MD  ASSISTANT: Mechele Claude, PA-C  ANESTHESIA:   General, regional  EBL:  minimal   TOURNIQUET:  30 min at 892 mm Hg  COMPLICATIONS:  None apparent  DISPOSITION:  Extubated, awake and stable to recovery.  DICTATION ID:  [id number]

## 2015-07-17 NOTE — Discharge Instructions (Addendum)
[name], MD [place]  Please read the following information regarding your care after surgery.  Medications  You only need a prescription for the narcotic pain medicine (ex. oxycodone, Percocet, Norco).  All of the other medicines listed below are available over the counter. X acetominophen (Tylenol) 650 mg every 4-6 hours as you need for minor pain X oxycodone as prescribed for moderate to severe pain ?   Narcotic pain medicine (ex. oxycodone, Percocet, Vicodin) will cause constipation.  To prevent this problem, take the following medicines while you are taking any pain medicine. X docusate sodium (Colace) 100 mg twice a day X senna (Senokot) 2 tablets twice a day  X To help prevent blood clots, take an aspirin (325 mg) once a day for a month after surgery.  You should also get up every hour while you are awake to move around.    Weight Bearing X Bear weight only on the heel of your operated foot in the post-op shoe.   Cast / Splint / Dressing X Keep your splint or cast clean and dry.  Dont put anything (coat hanger, pencil, etc) down inside of it.  If it gets damp, use a hair dryer on the cool setting to dry it.  If it gets soaked, call the office to schedule an appointment for a cast change.   After your dressing, cast or splint is removed; you may shower, but do not soak or scrub the wound.  Allow the water to run over it, and then gently pat it dry.  Swelling It is normal for you to have swelling where you had surgery.  To reduce swelling and pain, keep your toes above your nose for at least 3 days after surgery.  It may be necessary to keep your foot or leg elevated for several weeks.  If it hurts, it should be elevated.  Follow Up Call my office at [phone] when you are discharged from the hospital or surgery center to schedule an appointment to be seen two weeks after surgery.  Call my office at [phone] if you develop a fever >101.5 F, nausea,  vomiting, bleeding from the surgical site or severe pain.     Post Anesthesia Home Care Instructions  Activity: Get plenty of rest for the remainder of the day. A responsible adult should stay with you for 24 hours following the procedure.  For the next 24 hours, DO NOT: -Drive a car -Paediatric nurse -Drink alcoholic beverages -Take any medication unless instructed by your physician -Make any legal decisions or sign important papers.  Meals: Start with liquid foods such as gelatin or soup. Progress to regular foods as tolerated. Avoid greasy, spicy, heavy foods. If nausea and/or vomiting occur, drink only clear liquids until the nausea and/or vomiting subsides. Call your physician if vomiting continues.  Special Instructions/Symptoms: Your throat may feel dry or sore from the anesthesia or the breathing tube placed in your throat during surgery. If this causes discomfort, gargle with warm salt water. The discomfort should disappear within 24 hours.  If you had a scopolamine patch placed behind your ear for the management of post- operative nausea and/or vomiting:  1. The medication in the patch is effective for 72 hours, after which it should be removed.  Wrap patch in a tissue and discard in the trash. Wash hands thoroughly with soap and water. 2. You may remove the patch earlier than 72 hours if you experience unpleasant side effects which may include dry mouth, dizziness  include dry mouth, dizziness or visual disturbances. °3. Avoid touching the patch. Wash your hands with soap and water after contact with the patch. °   ° ° °

## 2015-07-17 NOTE — H&P (Signed)
Meghan Tucker is an 69 y.o. female.   Chief Complaint: left forefoot pain HPI: 69 y/o female with left 2nd and 3rd hammertoe deformities and metatarsalgia.  She has failed non op treatment and presents today for operative correction.  Past Medical History  Diagnosis Date  . GERD (gastroesophageal reflux disease)   . Diverticulosis   . Hyperlipidemia   . Osteopenia   . Vitamin D deficiency   . Arthritis     "all over"  . History of hiatal hernia   . History of kidney stones   . Enlarged thyroid   . Bulging lumbar disc   . Sciatica   . Hammer toe of left foot 06/2015    left 2nd and 3rd  . Immature cataract     bilateral  . Seasonal allergies   . Chest congestion 07/14/2015    cough productive of clear mucus, per pt.    Past Surgical History  Procedure Laterality Date  . Carpal tunnel release Left   . Ganglion cyst excision    . Esophagogastroduodenoscopy (egd) with propofol  10/19/2013  . Esophagogastroduodenoscopy (egd) with esophageal dilation  05/14/2015    with Propofol    Family History  Problem Relation Age of Onset  . Heart disease Father   . Stroke Mother    Social History:  reports that she has never smoked. She has never used smokeless tobacco. She reports that she drinks alcohol. She reports that she does not use illicit drugs.  Allergies: No Known Allergies  Medications Prior to Admission  Medication Sig Dispense Refill  . Biotin 5000 MCG CAPS Take 1 capsule by mouth daily.    [place] Kitchen CALCIUM CITRATE-VITAMIN D3 PO Take 1 tablet by mouth daily. Pt takes Calcium Citrate w/Magnesium, Zinc & Vitamin D3, every morning; and one every other night    . Coenzyme Q10 (COQ10) 200 MG CAPS Take 1 capsule by mouth daily.    [place] Kitchen CRANBERRY FRUIT PO Take 2 tablets by mouth daily. Pt takes AZO Cranberry    . ezetimibe (ZETIA) 10 MG tablet Take 10 mg by mouth daily.    . fish oil-omega-3 fatty acids 1000 MG capsule Take 2 capsules daily    . loratadine ([employer] ALLERGY RELIEF) 10 MG  tablet Take 10 mg by mouth daily.    [place] Kitchen omeprazole (PRILOSEC) 20 MG capsule Take 20 mg by mouth daily.    . potassium chloride (K-DUR) 10 MEQ tablet Take 10 mEq by mouth daily.    . Probiotic Product (PROBIOTIC DAILY PO) Take 1 tablet by mouth. Pt takes Cultruelle    . Vitamin D, Ergocalciferol, (DRISDOL) 50000 UNITS CAPS capsule Take 50,000 Units by mouth every 7 (seven) days.    [place] Kitchen zolpidem (AMBIEN) 5 MG tablet Take 1/2-1 tablet by mouth qhs prn    . denosumab (PROLIA) 60 MG/ML SOLN injection Inject 60 mg into the skin as directed. Twice a year      Results for orders placed or performed during the hospital encounter of 07/17/15 (from the past 48 hour(s))  Hemoglobin-hemacue, POC     Status: None   Collection Time: 07/17/15 10:26 AM  Result Value Ref Range   Hemoglobin 12.4 12.0 - 15.0 g/dL   No results found.  ROS  No recent f/c/nv//wt loss  Blood pressure 148/79, pulse 71, temperature 97.8 F (36.6 C), temperature source Oral, resp. rate 20, height 5' 0.5" (1.537 m), weight 60.782 kg (134 lb), SpO2 99 %. Physical Exam  wn wd woman in nad.  A and O x 4.  Mood and affect normal.  EOMI.  Resp unlaobred.  L foot with healthy skin.  No lymphadenopathy.  paplpable pulses.  5/5 strength in PF and DF of the toes.  Sens to LT intact.  2nd and 3rd hammertoe deformities are fixed.  Callous beneath 2nd and 3rd MT heads.  Assessment/Plan L 2nd and 3rd hammertoes and metatarsalgia - to OR for 2-3 weil osteotomies and hammertoe corrections.  The risks and benefits of the alternative treatment options have been discussed in detail.  The patient wishes to proceed with surgery and specifically understands risks of bleeding, infection, nerve damage, blood clots, need for additional surgery, amputation and death.   [name] 2015-07-25, 11:32 AM

## 2015-07-17 NOTE — Progress Notes (Signed)
Assisted Dr. Orene Desanctis with left, ankle block. Side rails up, monitors on throughout procedure. See vital signs in flow sheet. Tolerated Procedure well.

## 2015-07-17 NOTE — Transfer of Care (Signed)
Immediate Anesthesia Transfer of Care Note  Patient: Meghan Tucker  Procedure(s) Performed: Procedure(s): LEFT 2-3 WEIL OSTEOTOMY AND HAMMERTOE CORRECTION  (Left)  Patient Location: PACU  Anesthesia Type:GA combined with regional for post-op pain  Level of Consciousness: awake, alert , oriented and patient cooperative  Airway & Oxygen Therapy: Patient Spontanous Breathing and Patient connected to face mask oxygen  Post-op Assessment: Report given to RN and Post -op Vital signs reviewed and stable  Post vital signs: Reviewed and stable  Last Vitals:  Filed Vitals:   07/17/15 1247  BP:   Pulse: 92  Temp:   Resp: 15    Complications: No apparent anesthesia complications

## 2015-07-17 NOTE — Anesthesia Preprocedure Evaluation (Addendum)
Anesthesia Evaluation  Patient identified by MRN, date of birth, ID band Patient awake    Reviewed: Allergy & Precautions, NPO status   History of Anesthesia Complications Negative for: history of anesthetic complications  Airway Mallampati: II  TM Distance: >3 FB Neck ROM: Full    Dental  (+) Teeth Intact   Pulmonary neg pulmonary ROS,  breath sounds clear to auscultation        Cardiovascular negative cardio ROS  Rhythm:Regular Rate:Normal     Neuro/Psych    GI/Hepatic Neg liver ROS, hiatal hernia, GERD-  ,  Endo/Other    Renal/GU negative Renal ROS     Musculoskeletal  (+) Arthritis -,   Abdominal   Peds  Hematology negative hematology ROS (+)   Anesthesia Other Findings   Reproductive/Obstetrics                            Anesthesia Physical Anesthesia Plan  ASA: I  Anesthesia Plan: General   Post-op Pain Management:    Induction: Intravenous  Airway Management Planned: LMA  Additional Equipment:   Intra-op Plan:   Post-operative Plan: Extubation in OR  Informed Consent: I have reviewed the patients History and Physical, chart, labs and discussed the procedure including the risks, benefits and alternatives for the proposed anesthesia with the patient or authorized representative who has indicated his/her understanding and acceptance.   Dental advisory given  Plan Discussed with: CRNA and Surgeon  Anesthesia Plan Comments:         Anesthesia Quick Evaluation

## 2015-07-17 NOTE — Anesthesia Postprocedure Evaluation (Signed)
  Anesthesia Post-op Note  Patient: Meghan Tucker  Procedure(s) Performed: Procedure(s): LEFT 2-3 WEIL OSTEOTOMY AND HAMMERTOE CORRECTION  (Left)  Patient Location: PACU  Anesthesia Type:General  Level of Consciousness: awake and alert   Airway and Oxygen Therapy: Patient Spontanous Breathing  Post-op Pain: mild  Post-op Assessment: Post-op Vital signs reviewed LLE Motor Response: Purposeful movement LLE Sensation: Numbness, Tingling          Post-op Vital Signs: stable  Last Vitals:  Filed Vitals:   07/17/15 1400  BP: 175/77  Pulse: 72  Temp: 36.7 C  Resp: 18    Complications: No apparent anesthesia complications

## 2015-07-18 ENCOUNTER — Encounter (HOSPITAL_BASED_OUTPATIENT_CLINIC_OR_DEPARTMENT_OTHER): Payer: Self-pay | Admitting: Orthopedic Surgery

## 2015-07-18 NOTE — Op Note (Signed)
Meghan Tucker, Meghan Tucker NO.:  [id number]  MEDICAL RECORD NO.:  [id number]  LOCATION:                               FACILITY:  [facility]  PHYSICIAN:  [name], MD        DATE OF BIRTH:  [birth year]  DATE OF PROCEDURE:  07/17/2015 DATE OF DISCHARGE:  07/17/2015                              OPERATIVE REPORT   PREOPERATIVE DIAGNOSES:  Left second and third hammertoe deformities and second and third metatarsalgia.  POSTOPERATIVE DIAGNOSES:  Left second and third hammertoe deformities and second and third metatarsalgia.  PROCEDURES: 1. Left second and third hammertoe corrections through separate     incisions. 2. Left second and third metatarsal Weil osteotomies through separate     incisions. 3. AP and lateral radiographs of the left foot.  SURGEON:  [name], M.D.  ASSISTANT:  Mechele Claude, PA-C.  ANESTHESIA:  General, regional.  ESTIMATED BLOOD LOSS:  Minimal.  TOURNIQUET TIME:  30 minutes, 250 mmHg.  COMPLICATIONS:  None apparent.  DISPOSITION:  Extubated awake and stable to Recovery.  INDICATIONS FOR PROCEDURE:  The patient is a 69 year old woman, who complains of painful left forefoot deformities.  She has fixed hammertoe deformities of the second and third toes as well as metatarsalgia of the second and third metatarsal heads.  She presents now for operative treatment because of these painful conditions.  She understands the risks and benefits, the alternative treatment options, and elects surgical treatment.  She specifically understands risks of bleeding, infection, nerve damage, blood clots, need for additional surgery, continued pain, recurrent deformity, amputation, and death.  PROCEDURE IN DETAIL:  After preoperative consent was obtained and the correct operative site was identified, the patient was brought to the operating room and placed supine on the operating table.  General anesthesia was induced.  Preoperative antibiotics were  administered. Surgical time-out was taken.  The left lower extremity was prepped and draped in standard sterile fashion with the tourniquet around the thigh. The extremity was exsanguinated, and the tourniquet was inflated to 250 mmHg.  A longitudinal incision was made at the second MTP joint.  Sharp dissection was carried down through skin, subcutaneous tissue.  The extensor tendons were retracted, and the dorsal joint capsule was incised exposing the metatarsal head.  The tendons were protected as a Weil osteotomy was made with the oscillating saw.  A small wedge of bone was removed distally.  The metatarsal head was allowed to retract proximally and was fixed with a 2 mm Acumed Twist-Off Screw.  The overhanging bone was trimmed with a rongeur.  Attention was then turned to the third MTP joint, where the same procedure was performed through a separate incision.  Again an Acumed Twist-Off Screw was used to fix the osteotomy.  The overhanging bone from the osteotomy was trimmed with a rongeur.  Attention was turned to the second toe PIP joint, where a transverse incision was made.  Sharp dissection was carried down through the skin and subcutaneous tissue and extensor mechanism.  The head of the proximal phalanx and base of the middle phalanx were resected with the oscillating saw.  The joint was then reduced and  fixed with an Acumed Acutrak screw.  This same procedure was then repeated for the third toe through again a separate incision.  AP and lateral radiographs confirmed appropriate position and length of all hardware and appropriate correction of the metatarsalgia and hammertoe deformities.  Wounds were irrigated copiously and closed with nylon and Monocryl.  Sterile dressings were applied followed by compression wrap.  Tourniquet was released at 30 minutes after application of the dressings.  The patient was awakened from anesthesia and transported to the recovery room in stable  condition.  FOLLOWUP PLAN:  Initially, she will be weightbearing as tolerated on her left foot in a Darco shoe.  She will follow up with me in 2 weeks for suture removal.  RADIOGRAPHS:  AP and lateral radiographs of the left foot were obtained intraoperatively.  These show interval correction of her hammertoe deformities at the second and third toes with appropriately positioned hardware.  Screws across the metatarsal osteotomies are also appropriately positioned.  No other acute injury is noted.  Mechele Claude, PA-C was present and scrubbed for the duration of the case.  His assistance was essential in positioning the patient, prepping and draping, gaining and maintaining exposure, performing the operation, closing and dressing the wounds.     [name], MD   ______________________________ [name], MD    JH/MEDQ  D:  07/17/2015  T:  07/17/2015  Job:  [id number]

## 2015-07-30 DIAGNOSIS — M2042 Other hammer toe(s) (acquired), left foot: Secondary | ICD-10-CM | POA: Diagnosis not present

## 2015-07-30 DIAGNOSIS — Z4789 Encounter for other orthopedic aftercare: Secondary | ICD-10-CM | POA: Diagnosis not present

## 2015-08-06 DIAGNOSIS — D2372 Other benign neoplasm of skin of left lower limb, including hip: Secondary | ICD-10-CM | POA: Diagnosis not present

## 2015-08-06 DIAGNOSIS — D1801 Hemangioma of skin and subcutaneous tissue: Secondary | ICD-10-CM | POA: Diagnosis not present

## 2015-08-06 DIAGNOSIS — L814 Other melanin hyperpigmentation: Secondary | ICD-10-CM | POA: Diagnosis not present

## 2015-08-06 DIAGNOSIS — D225 Melanocytic nevi of trunk: Secondary | ICD-10-CM | POA: Diagnosis not present

## 2015-08-06 DIAGNOSIS — L821 Other seborrheic keratosis: Secondary | ICD-10-CM | POA: Diagnosis not present

## 2015-08-06 DIAGNOSIS — L853 Xerosis cutis: Secondary | ICD-10-CM | POA: Diagnosis not present

## 2015-08-08 DIAGNOSIS — M810 Age-related osteoporosis without current pathological fracture: Secondary | ICD-10-CM | POA: Diagnosis not present

## 2015-08-27 DIAGNOSIS — M2042 Other hammer toe(s) (acquired), left foot: Secondary | ICD-10-CM | POA: Diagnosis not present

## 2015-08-27 DIAGNOSIS — Z4789 Encounter for other orthopedic aftercare: Secondary | ICD-10-CM | POA: Diagnosis not present

## 2015-08-27 DIAGNOSIS — G5762 Lesion of plantar nerve, left lower limb: Secondary | ICD-10-CM | POA: Diagnosis not present

## 2015-08-29 ENCOUNTER — Encounter: Payer: Self-pay | Admitting: Internal Medicine

## 2015-09-15 DIAGNOSIS — J343 Hypertrophy of nasal turbinates: Secondary | ICD-10-CM | POA: Diagnosis not present

## 2015-09-15 DIAGNOSIS — J342 Deviated nasal septum: Secondary | ICD-10-CM | POA: Diagnosis not present

## 2015-09-15 DIAGNOSIS — J329 Chronic sinusitis, unspecified: Secondary | ICD-10-CM | POA: Diagnosis not present

## 2015-09-15 DIAGNOSIS — J3489 Other specified disorders of nose and nasal sinuses: Secondary | ICD-10-CM | POA: Diagnosis not present

## 2015-09-15 DIAGNOSIS — L820 Inflamed seborrheic keratosis: Secondary | ICD-10-CM | POA: Diagnosis not present

## 2015-09-15 DIAGNOSIS — J328 Other chronic sinusitis: Secondary | ICD-10-CM | POA: Diagnosis not present

## 2015-09-19 DIAGNOSIS — M5416 Radiculopathy, lumbar region: Secondary | ICD-10-CM | POA: Diagnosis not present

## 2015-09-19 DIAGNOSIS — M5441 Lumbago with sciatica, right side: Secondary | ICD-10-CM | POA: Diagnosis not present

## 2015-09-19 DIAGNOSIS — M4316 Spondylolisthesis, lumbar region: Secondary | ICD-10-CM | POA: Diagnosis not present

## 2015-09-24 DIAGNOSIS — Z4789 Encounter for other orthopedic aftercare: Secondary | ICD-10-CM | POA: Diagnosis not present

## 2015-09-24 DIAGNOSIS — G5762 Lesion of plantar nerve, left lower limb: Secondary | ICD-10-CM | POA: Diagnosis not present

## 2015-10-09 DIAGNOSIS — M791 Myalgia: Secondary | ICD-10-CM | POA: Diagnosis not present

## 2015-10-09 DIAGNOSIS — Z23 Encounter for immunization: Secondary | ICD-10-CM | POA: Diagnosis not present

## 2015-10-09 DIAGNOSIS — E784 Other hyperlipidemia: Secondary | ICD-10-CM | POA: Diagnosis not present

## 2015-10-09 DIAGNOSIS — E559 Vitamin D deficiency, unspecified: Secondary | ICD-10-CM | POA: Diagnosis not present

## 2015-11-06 DIAGNOSIS — Z01419 Encounter for gynecological examination (general) (routine) without abnormal findings: Secondary | ICD-10-CM | POA: Diagnosis not present

## 2015-11-06 DIAGNOSIS — Z1151 Encounter for screening for human papillomavirus (HPV): Secondary | ICD-10-CM | POA: Diagnosis not present

## 2015-11-06 DIAGNOSIS — N72 Inflammatory disease of cervix uteri: Secondary | ICD-10-CM | POA: Diagnosis not present

## 2015-11-10 DIAGNOSIS — J3089 Other allergic rhinitis: Secondary | ICD-10-CM | POA: Diagnosis not present

## 2015-11-10 DIAGNOSIS — K130 Diseases of lips: Secondary | ICD-10-CM | POA: Diagnosis not present

## 2015-11-10 DIAGNOSIS — E782 Mixed hyperlipidemia: Secondary | ICD-10-CM | POA: Diagnosis not present

## 2015-11-10 DIAGNOSIS — M791 Myalgia: Secondary | ICD-10-CM | POA: Diagnosis not present

## 2015-11-10 DIAGNOSIS — R400 Somnolence: Secondary | ICD-10-CM | POA: Diagnosis not present

## 2015-11-10 DIAGNOSIS — R1013 Epigastric pain: Secondary | ICD-10-CM | POA: Diagnosis not present

## 2015-11-27 DIAGNOSIS — H2512 Age-related nuclear cataract, left eye: Secondary | ICD-10-CM | POA: Diagnosis not present

## 2015-12-01 DIAGNOSIS — L821 Other seborrheic keratosis: Secondary | ICD-10-CM | POA: Diagnosis not present

## 2015-12-01 DIAGNOSIS — L299 Pruritus, unspecified: Secondary | ICD-10-CM | POA: Diagnosis not present

## 2015-12-01 DIAGNOSIS — L719 Rosacea, unspecified: Secondary | ICD-10-CM | POA: Diagnosis not present

## 2015-12-03 DIAGNOSIS — R400 Somnolence: Secondary | ICD-10-CM | POA: Diagnosis not present

## 2015-12-25 DIAGNOSIS — M67472 Ganglion, left ankle and foot: Secondary | ICD-10-CM | POA: Diagnosis not present

## 2015-12-25 DIAGNOSIS — M19072 Primary osteoarthritis, left ankle and foot: Secondary | ICD-10-CM | POA: Diagnosis not present

## 2015-12-26 DIAGNOSIS — M545 Low back pain: Secondary | ICD-10-CM | POA: Diagnosis not present

## 2015-12-26 DIAGNOSIS — M5416 Radiculopathy, lumbar region: Secondary | ICD-10-CM | POA: Diagnosis not present

## 2015-12-26 DIAGNOSIS — M4726 Other spondylosis with radiculopathy, lumbar region: Secondary | ICD-10-CM | POA: Diagnosis not present

## 2016-01-01 DIAGNOSIS — R030 Elevated blood-pressure reading, without diagnosis of hypertension: Secondary | ICD-10-CM | POA: Diagnosis not present

## 2016-01-07 DIAGNOSIS — H2512 Age-related nuclear cataract, left eye: Secondary | ICD-10-CM | POA: Diagnosis not present

## 2016-01-07 DIAGNOSIS — H52202 Unspecified astigmatism, left eye: Secondary | ICD-10-CM | POA: Diagnosis not present

## 2016-01-07 DIAGNOSIS — H25812 Combined forms of age-related cataract, left eye: Secondary | ICD-10-CM | POA: Diagnosis not present

## 2016-01-13 DIAGNOSIS — R002 Palpitations: Secondary | ICD-10-CM | POA: Diagnosis not present

## 2016-01-13 DIAGNOSIS — R10813 Right lower quadrant abdominal tenderness: Secondary | ICD-10-CM | POA: Diagnosis not present

## 2016-01-13 DIAGNOSIS — I10 Essential (primary) hypertension: Secondary | ICD-10-CM | POA: Diagnosis not present

## 2016-01-13 DIAGNOSIS — N3000 Acute cystitis without hematuria: Secondary | ICD-10-CM | POA: Diagnosis not present

## 2016-01-14 DIAGNOSIS — I10 Essential (primary) hypertension: Secondary | ICD-10-CM | POA: Diagnosis not present

## 2016-01-14 DIAGNOSIS — E782 Mixed hyperlipidemia: Secondary | ICD-10-CM | POA: Diagnosis not present

## 2016-01-19 DIAGNOSIS — H2511 Age-related nuclear cataract, right eye: Secondary | ICD-10-CM | POA: Diagnosis not present

## 2016-01-19 DIAGNOSIS — H25811 Combined forms of age-related cataract, right eye: Secondary | ICD-10-CM | POA: Diagnosis not present

## 2016-01-19 DIAGNOSIS — H52201 Unspecified astigmatism, right eye: Secondary | ICD-10-CM | POA: Diagnosis not present

## 2016-02-06 DIAGNOSIS — M79672 Pain in left foot: Secondary | ICD-10-CM | POA: Diagnosis not present

## 2016-02-06 DIAGNOSIS — G8929 Other chronic pain: Secondary | ICD-10-CM | POA: Diagnosis not present

## 2016-02-09 DIAGNOSIS — L719 Rosacea, unspecified: Secondary | ICD-10-CM | POA: Diagnosis not present

## 2016-02-09 DIAGNOSIS — L299 Pruritus, unspecified: Secondary | ICD-10-CM | POA: Diagnosis not present

## 2016-02-09 DIAGNOSIS — L820 Inflamed seborrheic keratosis: Secondary | ICD-10-CM | POA: Diagnosis not present

## 2016-02-17 DIAGNOSIS — R10813 Right lower quadrant abdominal tenderness: Secondary | ICD-10-CM | POA: Diagnosis not present

## 2016-02-17 DIAGNOSIS — J0100 Acute maxillary sinusitis, unspecified: Secondary | ICD-10-CM | POA: Diagnosis not present

## 2016-02-17 DIAGNOSIS — R42 Dizziness and giddiness: Secondary | ICD-10-CM | POA: Diagnosis not present

## 2016-02-17 DIAGNOSIS — H9311 Tinnitus, right ear: Secondary | ICD-10-CM | POA: Diagnosis not present

## 2016-02-17 DIAGNOSIS — R51 Headache: Secondary | ICD-10-CM | POA: Diagnosis not present

## 2016-02-17 DIAGNOSIS — M79645 Pain in left finger(s): Secondary | ICD-10-CM | POA: Diagnosis not present

## 2016-03-01 DIAGNOSIS — H04123 Dry eye syndrome of bilateral lacrimal glands: Secondary | ICD-10-CM | POA: Diagnosis not present

## 2016-03-04 DIAGNOSIS — R51 Headache: Secondary | ICD-10-CM | POA: Diagnosis not present

## 2016-03-04 DIAGNOSIS — R1031 Right lower quadrant pain: Secondary | ICD-10-CM | POA: Diagnosis not present

## 2016-03-04 DIAGNOSIS — G319 Degenerative disease of nervous system, unspecified: Secondary | ICD-10-CM | POA: Diagnosis not present

## 2016-03-04 DIAGNOSIS — H9319 Tinnitus, unspecified ear: Secondary | ICD-10-CM | POA: Diagnosis not present

## 2016-03-04 DIAGNOSIS — I6789 Other cerebrovascular disease: Secondary | ICD-10-CM | POA: Diagnosis not present

## 2016-03-04 DIAGNOSIS — K5730 Diverticulosis of large intestine without perforation or abscess without bleeding: Secondary | ICD-10-CM | POA: Diagnosis not present

## 2016-03-05 DIAGNOSIS — M810 Age-related osteoporosis without current pathological fracture: Secondary | ICD-10-CM | POA: Diagnosis not present

## 2016-03-17 DIAGNOSIS — Z23 Encounter for immunization: Secondary | ICD-10-CM | POA: Diagnosis not present

## 2016-03-25 DIAGNOSIS — E782 Mixed hyperlipidemia: Secondary | ICD-10-CM | POA: Diagnosis not present

## 2016-03-25 DIAGNOSIS — F419 Anxiety disorder, unspecified: Secondary | ICD-10-CM | POA: Diagnosis not present

## 2016-03-25 DIAGNOSIS — I129 Hypertensive chronic kidney disease with stage 1 through stage 4 chronic kidney disease, or unspecified chronic kidney disease: Secondary | ICD-10-CM | POA: Diagnosis not present

## 2016-03-25 DIAGNOSIS — Z131 Encounter for screening for diabetes mellitus: Secondary | ICD-10-CM | POA: Diagnosis not present

## 2016-03-25 DIAGNOSIS — N183 Chronic kidney disease, stage 3 (moderate): Secondary | ICD-10-CM | POA: Diagnosis not present

## 2016-03-31 DIAGNOSIS — Z1231 Encounter for screening mammogram for malignant neoplasm of breast: Secondary | ICD-10-CM | POA: Diagnosis not present

## 2016-04-19 DIAGNOSIS — M4726 Other spondylosis with radiculopathy, lumbar region: Secondary | ICD-10-CM | POA: Diagnosis not present

## 2016-04-19 DIAGNOSIS — M5417 Radiculopathy, lumbosacral region: Secondary | ICD-10-CM | POA: Diagnosis not present

## 2016-04-19 DIAGNOSIS — M5416 Radiculopathy, lumbar region: Secondary | ICD-10-CM | POA: Diagnosis not present

## 2016-04-20 DIAGNOSIS — I10 Essential (primary) hypertension: Secondary | ICD-10-CM | POA: Diagnosis not present

## 2016-04-20 DIAGNOSIS — R42 Dizziness and giddiness: Secondary | ICD-10-CM | POA: Diagnosis not present

## 2016-04-20 DIAGNOSIS — R5383 Other fatigue: Secondary | ICD-10-CM | POA: Diagnosis not present

## 2016-04-20 DIAGNOSIS — R002 Palpitations: Secondary | ICD-10-CM | POA: Diagnosis not present

## 2016-04-20 DIAGNOSIS — E782 Mixed hyperlipidemia: Secondary | ICD-10-CM | POA: Diagnosis not present

## 2016-04-29 DIAGNOSIS — D692 Other nonthrombocytopenic purpura: Secondary | ICD-10-CM | POA: Diagnosis not present

## 2016-04-29 DIAGNOSIS — D1801 Hemangioma of skin and subcutaneous tissue: Secondary | ICD-10-CM | POA: Diagnosis not present

## 2016-04-29 DIAGNOSIS — L814 Other melanin hyperpigmentation: Secondary | ICD-10-CM | POA: Diagnosis not present

## 2016-04-29 DIAGNOSIS — L821 Other seborrheic keratosis: Secondary | ICD-10-CM | POA: Diagnosis not present

## 2016-04-29 DIAGNOSIS — L720 Epidermal cyst: Secondary | ICD-10-CM | POA: Diagnosis not present

## 2016-05-05 DIAGNOSIS — I788 Other diseases of capillaries: Secondary | ICD-10-CM | POA: Diagnosis not present

## 2016-05-05 DIAGNOSIS — L820 Inflamed seborrheic keratosis: Secondary | ICD-10-CM | POA: Diagnosis not present

## 2016-05-17 DIAGNOSIS — H02834 Dermatochalasis of left upper eyelid: Secondary | ICD-10-CM | POA: Diagnosis not present

## 2016-05-17 DIAGNOSIS — H02831 Dermatochalasis of right upper eyelid: Secondary | ICD-10-CM | POA: Diagnosis not present

## 2016-05-18 DIAGNOSIS — G5601 Carpal tunnel syndrome, right upper limb: Secondary | ICD-10-CM | POA: Diagnosis not present

## 2016-05-18 DIAGNOSIS — J018 Other acute sinusitis: Secondary | ICD-10-CM | POA: Diagnosis not present

## 2016-05-27 DIAGNOSIS — M4316 Spondylolisthesis, lumbar region: Secondary | ICD-10-CM | POA: Diagnosis not present

## 2016-05-27 DIAGNOSIS — I10 Essential (primary) hypertension: Secondary | ICD-10-CM | POA: Diagnosis not present

## 2016-05-27 DIAGNOSIS — E559 Vitamin D deficiency, unspecified: Secondary | ICD-10-CM | POA: Diagnosis not present

## 2016-05-27 DIAGNOSIS — E782 Mixed hyperlipidemia: Secondary | ICD-10-CM | POA: Diagnosis not present

## 2016-05-27 DIAGNOSIS — E048 Other specified nontoxic goiter: Secondary | ICD-10-CM | POA: Diagnosis not present

## 2016-06-03 DIAGNOSIS — M4316 Spondylolisthesis, lumbar region: Secondary | ICD-10-CM | POA: Diagnosis not present

## 2016-06-03 DIAGNOSIS — M5136 Other intervertebral disc degeneration, lumbar region: Secondary | ICD-10-CM | POA: Diagnosis not present

## 2016-06-04 DIAGNOSIS — J028 Acute pharyngitis due to other specified organisms: Secondary | ICD-10-CM | POA: Diagnosis not present

## 2016-06-04 DIAGNOSIS — F5101 Primary insomnia: Secondary | ICD-10-CM | POA: Diagnosis not present

## 2016-06-04 DIAGNOSIS — R143 Flatulence: Secondary | ICD-10-CM | POA: Diagnosis not present

## 2016-06-04 DIAGNOSIS — R5383 Other fatigue: Secondary | ICD-10-CM | POA: Diagnosis not present

## 2016-06-09 DIAGNOSIS — E782 Mixed hyperlipidemia: Secondary | ICD-10-CM | POA: Diagnosis not present

## 2016-06-09 DIAGNOSIS — R42 Dizziness and giddiness: Secondary | ICD-10-CM | POA: Diagnosis not present

## 2016-06-09 DIAGNOSIS — I10 Essential (primary) hypertension: Secondary | ICD-10-CM | POA: Diagnosis not present

## 2016-06-14 DIAGNOSIS — R05 Cough: Secondary | ICD-10-CM | POA: Diagnosis not present

## 2016-06-14 DIAGNOSIS — K219 Gastro-esophageal reflux disease without esophagitis: Secondary | ICD-10-CM | POA: Diagnosis not present

## 2016-06-14 DIAGNOSIS — R070 Pain in throat: Secondary | ICD-10-CM | POA: Diagnosis not present

## 2016-06-14 DIAGNOSIS — R490 Dysphonia: Secondary | ICD-10-CM | POA: Diagnosis not present

## 2016-06-23 DIAGNOSIS — M4316 Spondylolisthesis, lumbar region: Secondary | ICD-10-CM | POA: Diagnosis not present

## 2016-06-30 DIAGNOSIS — T148 Other injury of unspecified body region: Secondary | ICD-10-CM | POA: Diagnosis not present

## 2016-08-02 DIAGNOSIS — M4806 Spinal stenosis, lumbar region: Secondary | ICD-10-CM | POA: Diagnosis not present

## 2016-08-02 DIAGNOSIS — M5416 Radiculopathy, lumbar region: Secondary | ICD-10-CM | POA: Diagnosis not present

## 2016-08-02 DIAGNOSIS — M5136 Other intervertebral disc degeneration, lumbar region: Secondary | ICD-10-CM | POA: Diagnosis not present

## 2016-08-02 DIAGNOSIS — M4726 Other spondylosis with radiculopathy, lumbar region: Secondary | ICD-10-CM | POA: Diagnosis not present

## 2016-08-23 ENCOUNTER — Other Ambulatory Visit: Payer: Self-pay

## 2016-08-25 DIAGNOSIS — M5440 Lumbago with sciatica, unspecified side: Secondary | ICD-10-CM | POA: Diagnosis not present

## 2016-08-25 DIAGNOSIS — M62552 Muscle wasting and atrophy, not elsewhere classified, left thigh: Secondary | ICD-10-CM | POA: Diagnosis not present

## 2016-08-25 DIAGNOSIS — M62551 Muscle wasting and atrophy, not elsewhere classified, right thigh: Secondary | ICD-10-CM | POA: Diagnosis not present

## 2016-09-01 DIAGNOSIS — M62551 Muscle wasting and atrophy, not elsewhere classified, right thigh: Secondary | ICD-10-CM | POA: Diagnosis not present

## 2016-09-01 DIAGNOSIS — M62552 Muscle wasting and atrophy, not elsewhere classified, left thigh: Secondary | ICD-10-CM | POA: Diagnosis not present

## 2016-09-01 DIAGNOSIS — M5440 Lumbago with sciatica, unspecified side: Secondary | ICD-10-CM | POA: Diagnosis not present

## 2016-09-14 DIAGNOSIS — E782 Mixed hyperlipidemia: Secondary | ICD-10-CM | POA: Diagnosis not present

## 2016-09-14 DIAGNOSIS — I10 Essential (primary) hypertension: Secondary | ICD-10-CM | POA: Diagnosis not present

## 2016-09-14 DIAGNOSIS — M4806 Spinal stenosis, lumbar region: Secondary | ICD-10-CM | POA: Diagnosis not present

## 2016-09-14 DIAGNOSIS — R0789 Other chest pain: Secondary | ICD-10-CM | POA: Diagnosis not present

## 2016-09-16 DIAGNOSIS — E049 Nontoxic goiter, unspecified: Secondary | ICD-10-CM | POA: Diagnosis not present

## 2016-09-16 DIAGNOSIS — R0789 Other chest pain: Secondary | ICD-10-CM | POA: Diagnosis not present

## 2016-09-19 DIAGNOSIS — S61431A Puncture wound without foreign body of right hand, initial encounter: Secondary | ICD-10-CM | POA: Diagnosis not present

## 2016-09-23 DIAGNOSIS — M4316 Spondylolisthesis, lumbar region: Secondary | ICD-10-CM | POA: Diagnosis not present

## 2016-10-12 ENCOUNTER — Other Ambulatory Visit: Payer: Self-pay | Admitting: Neurological Surgery

## 2016-10-12 DIAGNOSIS — M810 Age-related osteoporosis without current pathological fracture: Secondary | ICD-10-CM | POA: Diagnosis not present

## 2016-10-12 DIAGNOSIS — Z23 Encounter for immunization: Secondary | ICD-10-CM | POA: Diagnosis not present

## 2016-10-21 ENCOUNTER — Encounter (HOSPITAL_COMMUNITY)
Admission: RE | Admit: 2016-10-21 | Discharge: 2016-10-21 | Disposition: A | Discharge status: Home / Self Care | Payer: Medicare Other | Source: Ambulatory Visit | Attending: Neurological Surgery | Admitting: Neurological Surgery

## 2016-10-21 ENCOUNTER — Encounter (HOSPITAL_COMMUNITY): Payer: Self-pay

## 2016-10-21 DIAGNOSIS — K219 Gastro-esophageal reflux disease without esophagitis: Secondary | ICD-10-CM | POA: Insufficient documentation

## 2016-10-21 DIAGNOSIS — M5442 Lumbago with sciatica, left side: Secondary | ICD-10-CM | POA: Insufficient documentation

## 2016-10-21 DIAGNOSIS — M5441 Lumbago with sciatica, right side: Secondary | ICD-10-CM | POA: Diagnosis not present

## 2016-10-21 DIAGNOSIS — M545 Low back pain: Secondary | ICD-10-CM | POA: Insufficient documentation

## 2016-10-21 DIAGNOSIS — K449 Diaphragmatic hernia without obstruction or gangrene: Secondary | ICD-10-CM | POA: Insufficient documentation

## 2016-10-21 DIAGNOSIS — E785 Hyperlipidemia, unspecified: Secondary | ICD-10-CM | POA: Insufficient documentation

## 2016-10-21 DIAGNOSIS — G8929 Other chronic pain: Secondary | ICD-10-CM | POA: Insufficient documentation

## 2016-10-21 DIAGNOSIS — I10 Essential (primary) hypertension: Secondary | ICD-10-CM | POA: Diagnosis not present

## 2016-10-21 DIAGNOSIS — Z01812 Encounter for preprocedural laboratory examination: Secondary | ICD-10-CM | POA: Insufficient documentation

## 2016-10-21 HISTORY — DX: Personal history of other diseases of the nervous system and sense organs: Z86.69

## 2016-10-21 HISTORY — DX: Paresthesia of skin: R20.2

## 2016-10-21 HISTORY — DX: Essential (primary) hypertension: I10

## 2016-10-21 HISTORY — DX: Other chronic pain: G89.29

## 2016-10-21 HISTORY — DX: Dorsalgia, unspecified: M54.9

## 2016-10-21 HISTORY — DX: Insomnia, unspecified: G47.00

## 2016-10-21 LAB — CBC
HCT: 44.3 % (ref 36.0–46.0)
Hemoglobin: 14.7 g/dL (ref 12.0–15.0)
MCH: 31.9 pg (ref 26.0–34.0)
MCHC: 33.2 g/dL (ref 30.0–36.0)
MCV: 96.1 fL (ref 78.0–100.0)
Platelets: 253 10*3/uL (ref 150–400)
RBC: 4.61 MIL/uL (ref 3.87–5.11)
RDW: 13.7 % (ref 11.5–15.5)
WBC: 6.2 10*3/uL (ref 4.0–10.5)

## 2016-10-21 LAB — BASIC METABOLIC PANEL
Anion gap: 6 (ref 5–15)
BUN: 14 mg/dL (ref 6–20)
CO2: 26 mmol/L (ref 22–32)
Calcium: 9.2 mg/dL (ref 8.9–10.3)
Chloride: 105 mmol/L (ref 101–111)
Creatinine, Ser: 0.74 mg/dL (ref 0.44–1.00)
GFR calc Af Amer: >60 mL/min (ref >60)
GFR calc non Af Amer: >60 mL/min (ref >60)
Glucose, Bld: 123 mg/dL — ABNORMAL HIGH (ref 65–99)
Potassium: 4 mmol/L (ref 3.5–5.1)
Sodium: 137 mmol/L (ref 135–145)

## 2016-10-21 LAB — TYPE AND SCREEN
ABO/RH(D): A POS
Antibody Screen: NEGATIVE

## 2016-10-21 LAB — SURGICAL PCR SCREEN
MRSA, PCR: NEGATIVE
Staphylococcus aureus: NEGATIVE

## 2016-10-21 LAB — ABO/RH: ABO/RH(D): A POS

## 2016-10-21 MED ORDER — CHLORHEXIDINE GLUCONATE CLOTH 2 % EX PADS: 6.0000 | MEDICATED_PAD | Freq: Once | CUTANEOUS | Status: DC

## 2016-10-21 NOTE — Progress Notes (Addendum)
Cardiologist denies  [facility]es [facility]   Echo denies  Stress test > 5 yrs ago  Heart cath denies  EKG to be requested from [place] denies

## 2016-10-21 NOTE — Pre-Procedure Instructions (Signed)
Meghan Tucker  10/21/2016      [facility] [place], [place] [address] Phone: [phone] Fax: [phone]    Your procedure is scheduled on Tues, Oct 31 @ 11:00 AM  Report to [facility] Admitting at 8:00 AM  Call this number if you have problems the morning of surgery:  [phone]   Remember:  Do not eat food or drink liquids after midnight.  Take these medicines the morning of surgery with A SIP OF WATER Metoprolol(Toprol),Nasonex(Mometasone-if needed),Omeprazole(Prilosec),and Pain Pill(if needed)  Stop taking your Fish Oil along with any Vitamins or Herbal Medications. No Goody's,BC's,Aleve,Advil,Motrin,Ibuprofen,Fish Oil.   Do not wear jewelry, make-up or nail polish.  Do not wear lotions, powders, perfumes, or deoderant.  Do not shave 48 hours prior to surgery.    Do not bring valuables to the hospital.  [facility] is not responsible for any belongings or valuables.  Contacts, dentures or bridgework may not be worn into surgery.  Leave your suitcase in the car.  After surgery it may be brought to your room.  For patients admitted to the hospital, discharge time will be determined by your treatment team.  Patients discharged the day of surgery will not be allowed to drive home.    [facility]  Before surgery, you can play an important role.  Because skin is not sterile, your skin needs to be as free of germs as possible.  You can reduce the number of germs on you skin by washing with CHG (chlorahexidine gluconate) soap before surgery.  CHG is an antiseptic cleaner which kills germs and bonds with the skin to continue killing germs even after washing.  Please DO NOT use if you have an allergy to CHG or antibacterial soaps.  If your skin becomes reddened/irritated stop using the CHG and inform your nurse when you arrive at Short Stay.  Do not shave  (including legs and underarms) for at least 48 hours prior to the first CHG shower.  You may shave your face.  Please follow these instructions carefully:   1.  Shower with CHG Soap the night before surgery and the                                morning of Surgery.  2.  If you choose to wash your hair, wash your hair first as usual with your       normal shampoo.  3.  After you shampoo, rinse your hair and body thoroughly to remove the                      Shampoo.  4.  Use CHG as you would any other liquid soap.  You can apply chg directly       to the skin and wash gently with scrungie or a clean washcloth.  5.  Apply the CHG Soap to your body ONLY FROM THE NECK DOWN.        Do not use on open wounds or open sores.  Avoid contact with your eyes,       ears, mouth and genitals (private parts).  Wash genitals (private parts)       with your normal soap.  6.  Wash thoroughly, paying special attention to the area where your surgery  will be performed.  7.  Thoroughly rinse your body with warm water from the neck down.  8.  DO NOT shower/wash with your normal soap after using and rinsing off       the CHG Soap.  9.  Pat yourself dry with a clean towel.            10.  Wear clean pajamas.            11.  Place clean sheets on your bed the night of your first shower and do not        sleep with pets.  Day of Surgery  Do not apply any lotions/deoderants the morning of surgery.  Please wear clean clothes to the hospital/surgery center.     Please read over the following fact sheets that you were given. Pain Booklet, Coughing and Deep Breathing, MRSA Information and Surgical Site Infection Prevention

## 2016-10-26 ENCOUNTER — Inpatient Hospital Stay (HOSPITAL_COMMUNITY): Payer: Medicare Other

## 2016-10-26 ENCOUNTER — Inpatient Hospital Stay (HOSPITAL_COMMUNITY): Payer: Medicare Other | Admitting: Anesthesiology

## 2016-10-26 ENCOUNTER — Inpatient Hospital Stay (HOSPITAL_COMMUNITY)
Admission: RE | Admit: 2016-10-26 | Discharge: 2016-10-28 | DRG: 460 | Disposition: A | Discharge status: Home / Self Care | Payer: Medicare Other | Source: Ambulatory Visit | Attending: Neurological Surgery | Admitting: Neurological Surgery

## 2016-10-26 ENCOUNTER — Encounter (HOSPITAL_COMMUNITY)
Admission: RE | Disposition: A | Discharge status: Home / Self Care | Payer: Self-pay | Source: Ambulatory Visit | Attending: Neurological Surgery

## 2016-10-26 DIAGNOSIS — M4316 Spondylolisthesis, lumbar region: Principal | ICD-10-CM | POA: Diagnosis present

## 2016-10-26 DIAGNOSIS — M1990 Unspecified osteoarthritis, unspecified site: Secondary | ICD-10-CM | POA: Diagnosis not present

## 2016-10-26 DIAGNOSIS — M48061 Spinal stenosis, lumbar region without neurogenic claudication: Secondary | ICD-10-CM | POA: Diagnosis present

## 2016-10-26 DIAGNOSIS — M5416 Radiculopathy, lumbar region: Secondary | ICD-10-CM | POA: Diagnosis present

## 2016-10-26 DIAGNOSIS — Z419 Encounter for procedure for purposes other than remedying health state, unspecified: Secondary | ICD-10-CM

## 2016-10-26 DIAGNOSIS — K219 Gastro-esophageal reflux disease without esophagitis: Secondary | ICD-10-CM | POA: Diagnosis not present

## 2016-10-26 DIAGNOSIS — Z981 Arthrodesis status: Secondary | ICD-10-CM | POA: Diagnosis not present

## 2016-10-26 DIAGNOSIS — M549 Dorsalgia, unspecified: Secondary | ICD-10-CM | POA: Diagnosis not present

## 2016-10-26 HISTORY — DX: Spondylolisthesis, lumbar region: M43.16

## 2016-10-26 SURGERY — POSTERIOR LUMBAR FUSION 1 LEVEL
Anesthesia: General | Site: Back

## 2016-10-26 MED ORDER — ROCURONIUM BROMIDE 100 MG/10ML IV SOLN
INTRAVENOUS | Status: DC | PRN
  Administered 2016-10-26: 20 mg via INTRAVENOUS
  Administered 2016-10-26: 10 mg via INTRAVENOUS
  Administered 2016-10-26: 30 mg via INTRAVENOUS
  Administered 2016-10-26: 20 mg via INTRAVENOUS

## 2016-10-26 MED ORDER — SODIUM CHLORIDE 0.9 % IV SOLN: INTRAVENOUS | Status: DC

## 2016-10-26 MED ORDER — THROMBIN 5000 UNITS EX SOLR
CUTANEOUS | Status: AC
  Filled 2016-10-26: qty 5000

## 2016-10-26 MED ORDER — LIDOCAINE-EPINEPHRINE 1 %-1:100000 IJ SOLN
INTRAMUSCULAR | Status: AC
  Filled 2016-10-26: qty 1

## 2016-10-26 MED ORDER — MIDAZOLAM HCL 5 MG/5ML IJ SOLN
INTRAMUSCULAR | Status: DC | PRN
  Administered 2016-10-26 (×2): 1 mg via INTRAVENOUS

## 2016-10-26 MED ORDER — PHENOL 1.4 % MT LIQD: 1.0000 | OROMUCOSAL | Status: DC | PRN

## 2016-10-26 MED ORDER — THROMBIN 5000 UNITS EX SOLR
OROMUCOSAL | Status: DC | PRN
  Administered 2016-10-26: 11:00:00 via TOPICAL

## 2016-10-26 MED ORDER — DOCUSATE SODIUM 100 MG PO CAPS
100.0000 mg | ORAL_CAPSULE | Freq: Two times a day (BID) | ORAL | Status: DC
  Administered 2016-10-26 – 2016-10-27 (×3): 100 mg via ORAL
  Filled 2016-10-26 (×3): qty 1

## 2016-10-26 MED ORDER — OXYCODONE-ACETAMINOPHEN 5-325 MG PO TABS
1.0000 | ORAL_TABLET | ORAL | Status: DC | PRN
  Administered 2016-10-26 (×2): 1 via ORAL
  Administered 2016-10-27: 2 via ORAL
  Administered 2016-10-27: 1 via ORAL
  Administered 2016-10-27: 2 via ORAL
  Administered 2016-10-27: 1 via ORAL
  Administered 2016-10-28: 2 via ORAL
  Filled 2016-10-26: qty 1
  Filled 2016-10-26: qty 2
  Filled 2016-10-26: qty 1
  Filled 2016-10-26 (×3): qty 2
  Filled 2016-10-26 (×2): qty 1

## 2016-10-26 MED ORDER — FLUTICASONE PROPIONATE 50 MCG/ACT NA SUSP
1.0000 | Freq: Every day | NASAL | Status: DC
  Filled 2016-10-26: qty 16

## 2016-10-26 MED ORDER — KETOROLAC TROMETHAMINE 15 MG/ML IJ SOLN
15.0000 mg | Freq: Four times a day (QID) | INTRAMUSCULAR | Status: DC
  Administered 2016-10-26: 15 mg via INTRAVENOUS

## 2016-10-26 MED ORDER — THROMBIN 20000 UNITS EX SOLR
CUTANEOUS | Status: DC | PRN
  Administered 2016-10-26: 11:00:00 via TOPICAL

## 2016-10-26 MED ORDER — POLYVINYL ALCOHOL 1.4 % OP SOLN
1.0000 [drp] | Freq: Three times a day (TID) | OPHTHALMIC | Status: DC | PRN
  Filled 2016-10-26: qty 15

## 2016-10-26 MED ORDER — ACETAMINOPHEN 650 MG RE SUPP: 650.0000 mg | RECTAL | Status: DC | PRN

## 2016-10-26 MED ORDER — FENTANYL CITRATE (PF) 100 MCG/2ML IJ SOLN
INTRAMUSCULAR | Status: AC
  Filled 2016-10-26: qty 4

## 2016-10-26 MED ORDER — CEFAZOLIN IN D5W 1 GM/50ML IV SOLN
1.0000 g | Freq: Three times a day (TID) | INTRAVENOUS | Status: AC
  Administered 2016-10-26 – 2016-10-27 (×2): 1 g via INTRAVENOUS
  Filled 2016-10-26 (×2): qty 50

## 2016-10-26 MED ORDER — ONDANSETRON HCL 4 MG/2ML IJ SOLN
INTRAMUSCULAR | Status: DC | PRN
  Administered 2016-10-26: 4 mg via INTRAVENOUS

## 2016-10-26 MED ORDER — LACTATED RINGERS IV SOLN
INTRAVENOUS | Status: DC
  Administered 2016-10-26 (×3): via INTRAVENOUS

## 2016-10-26 MED ORDER — OMEPRAZOLE MAGNESIUM 20 MG PO TBEC: 20.0000 mg | DELAYED_RELEASE_TABLET | ORAL | Status: DC

## 2016-10-26 MED ORDER — DEXAMETHASONE SODIUM PHOSPHATE 10 MG/ML IJ SOLN
INTRAMUSCULAR | Status: DC | PRN
  Administered 2016-10-26: 10 mg via INTRAVENOUS

## 2016-10-26 MED ORDER — PHENYLEPHRINE HCL 10 MG/ML IJ SOLN
INTRAMUSCULAR | Status: DC | PRN
  Administered 2016-10-26 (×2): 40 ug via INTRAVENOUS

## 2016-10-26 MED ORDER — PROPOFOL 10 MG/ML IV BOLUS
INTRAVENOUS | Status: AC
  Filled 2016-10-26: qty 20

## 2016-10-26 MED ORDER — MENTHOL 3 MG MT LOZG
1.0000 | LOZENGE | OROMUCOSAL | Status: DC | PRN
  Filled 2016-10-26: qty 9

## 2016-10-26 MED ORDER — POLYETHYL GLYCOL-PROPYL GLYCOL 0.4-0.3 % OP SOLN: 1.0000 [drp] | Freq: Three times a day (TID) | OPHTHALMIC | Status: DC | PRN

## 2016-10-26 MED ORDER — ONDANSETRON 4 MG PO TBDP
4.0000 mg | ORAL_TABLET | ORAL | Status: DC | PRN
  Filled 2016-10-26: qty 1

## 2016-10-26 MED ORDER — PROPOFOL 10 MG/ML IV BOLUS
INTRAVENOUS | Status: DC | PRN
  Administered 2016-10-26: 150 mg via INTRAVENOUS

## 2016-10-26 MED ORDER — PROMETHAZINE HCL 25 MG/ML IJ SOLN
6.2500 mg | INTRAMUSCULAR | Status: DC | PRN
Start: 2016-10-26 — End: 2016-10-26

## 2016-10-26 MED ORDER — SODIUM CHLORIDE 0.9 % IV SOLN
INTRAVENOUS | Status: DC | PRN
  Administered 2016-10-26: 30 ug/min via INTRAVENOUS

## 2016-10-26 MED ORDER — FENTANYL CITRATE (PF) 100 MCG/2ML IJ SOLN
25.0000 ug | INTRAMUSCULAR | Status: DC | PRN
  Administered 2016-10-26 (×4): 25 ug via INTRAVENOUS

## 2016-10-26 MED ORDER — KETOROLAC TROMETHAMINE 15 MG/ML IJ SOLN
15.0000 mg | Freq: Four times a day (QID) | INTRAMUSCULAR | Status: AC
  Administered 2016-10-26 – 2016-10-27 (×5): 15 mg via INTRAVENOUS
  Filled 2016-10-26 (×5): qty 1

## 2016-10-26 MED ORDER — METOPROLOL SUCCINATE ER 25 MG PO TB24
25.0000 mg | ORAL_TABLET | Freq: Every day | ORAL | Status: DC
  Administered 2016-10-27: 25 mg via ORAL
  Filled 2016-10-26: qty 1

## 2016-10-26 MED ORDER — ALUM & MAG HYDROXIDE-SIMETH 200-200-20 MG/5ML PO SUSP: 30.0000 mL | Freq: Four times a day (QID) | ORAL | Status: DC | PRN

## 2016-10-26 MED ORDER — FENTANYL CITRATE (PF) 100 MCG/2ML IJ SOLN
INTRAMUSCULAR | Status: DC | PRN
  Administered 2016-10-26 (×2): 50 ug via INTRAVENOUS
  Administered 2016-10-26: 100 ug via INTRAVENOUS

## 2016-10-26 MED ORDER — BISACODYL 10 MG RE SUPP: 10.0000 mg | Freq: Every day | RECTAL | Status: DC | PRN

## 2016-10-26 MED ORDER — METHOCARBAMOL 500 MG PO TABS
500.0000 mg | ORAL_TABLET | Freq: Four times a day (QID) | ORAL | Status: DC | PRN
  Administered 2016-10-26 – 2016-10-28 (×5): 500 mg via ORAL
  Filled 2016-10-26 (×5): qty 1

## 2016-10-26 MED ORDER — OXYCODONE HCL 5 MG PO TABS
5.0000 mg | ORAL_TABLET | Freq: Once | ORAL | Status: AC | PRN
  Administered 2016-10-26: 5 mg via ORAL

## 2016-10-26 MED ORDER — THROMBIN 20000 UNITS EX SOLR
CUTANEOUS | Status: AC
  Filled 2016-10-26: qty 20000

## 2016-10-26 MED ORDER — 0.9 % SODIUM CHLORIDE (POUR BTL) OPTIME
TOPICAL | Status: DC | PRN
  Administered 2016-10-26: 1000 mL

## 2016-10-26 MED ORDER — MIDAZOLAM HCL 2 MG/2ML IJ SOLN
INTRAMUSCULAR | Status: AC
  Filled 2016-10-26: qty 2

## 2016-10-26 MED ORDER — OXYCODONE HCL 5 MG/5ML PO SOLN: 5.0000 mg | Freq: Once | ORAL | Status: AC | PRN

## 2016-10-26 MED ORDER — SODIUM CHLORIDE 0.9 % IR SOLN
Status: DC | PRN
  Administered 2016-10-26: 11:00:00

## 2016-10-26 MED ORDER — SENNA 8.6 MG PO TABS
1.0000 | ORAL_TABLET | Freq: Two times a day (BID) | ORAL | Status: DC
Start: 2016-10-26 — End: 2016-10-28
  Administered 2016-10-26 – 2016-10-27 (×2): 8.6 mg via ORAL
  Filled 2016-10-26 (×2): qty 1

## 2016-10-26 MED ORDER — ACETAMINOPHEN 325 MG PO TABS: 650.0000 mg | ORAL_TABLET | ORAL | Status: DC | PRN

## 2016-10-26 MED ORDER — BUPIVACAINE HCL (PF) 0.5 % IJ SOLN
INTRAMUSCULAR | Status: AC
  Filled 2016-10-26: qty 30

## 2016-10-26 MED ORDER — CEFAZOLIN SODIUM-DEXTROSE 2-4 GM/100ML-% IV SOLN
2.0000 g | INTRAVENOUS | Status: AC
  Administered 2016-10-26: 2 g via INTRAVENOUS
  Filled 2016-10-26: qty 100

## 2016-10-26 MED ORDER — ONDANSETRON HCL 4 MG/2ML IJ SOLN: 4.0000 mg | INTRAMUSCULAR | Status: DC | PRN

## 2016-10-26 MED ORDER — HYDROMORPHONE HCL 1 MG/ML IJ SOLN
0.5000 mg | INTRAMUSCULAR | Status: DC | PRN
  Administered 2016-10-26: 1 mg via INTRAVENOUS
  Filled 2016-10-26: qty 1

## 2016-10-26 MED ORDER — SUGAMMADEX SODIUM 200 MG/2ML IV SOLN
INTRAVENOUS | Status: DC | PRN
  Administered 2016-10-26: 120 mg via INTRAVENOUS

## 2016-10-26 MED ORDER — ARTIFICIAL TEARS OP OINT
TOPICAL_OINTMENT | OPHTHALMIC | Status: DC | PRN
  Administered 2016-10-26: 1 via OPHTHALMIC

## 2016-10-26 MED ORDER — METHOCARBAMOL 1000 MG/10ML IJ SOLN
500.0000 mg | Freq: Four times a day (QID) | INTRAVENOUS | Status: DC | PRN
  Filled 2016-10-26: qty 5

## 2016-10-26 MED ORDER — PANTOPRAZOLE SODIUM 40 MG PO TBEC: 40.0000 mg | DELAYED_RELEASE_TABLET | ORAL | Status: DC

## 2016-10-26 MED ORDER — OXYCODONE-ACETAMINOPHEN 5-325 MG PO TABS: 0.5000 | ORAL_TABLET | Freq: Two times a day (BID) | ORAL | Status: DC

## 2016-10-26 MED ORDER — POTASSIUM CHLORIDE CRYS ER 10 MEQ PO TBCR
10.0000 meq | EXTENDED_RELEASE_TABLET | Freq: Every day | ORAL | Status: DC
  Administered 2016-10-27: 10 meq via ORAL
  Filled 2016-10-26: qty 1

## 2016-10-26 MED ORDER — BUPIVACAINE HCL (PF) 0.5 % IJ SOLN
INTRAMUSCULAR | Status: DC | PRN
  Administered 2016-10-26: 25 mL
  Administered 2016-10-26: 10 mL

## 2016-10-26 MED ORDER — ZOLPIDEM TARTRATE 5 MG PO TABS
5.0000 mg | ORAL_TABLET | Freq: Every day | ORAL | Status: DC
  Administered 2016-10-26 – 2016-10-27 (×2): 5 mg via ORAL
  Filled 2016-10-26 (×2): qty 1

## 2016-10-26 MED ORDER — OXYCODONE HCL 5 MG PO TABS
ORAL_TABLET | ORAL | Status: AC
  Filled 2016-10-26: qty 1

## 2016-10-26 MED ORDER — POLYETHYLENE GLYCOL 3350 17 G PO PACK
17.0000 g | PACK | Freq: Every day | ORAL | Status: DC | PRN
  Filled 2016-10-26: qty 1

## 2016-10-26 MED ORDER — SODIUM CHLORIDE 0.9% FLUSH: 3.0000 mL | INTRAVENOUS | Status: DC | PRN

## 2016-10-26 MED ORDER — LIDOCAINE HCL (CARDIAC) 20 MG/ML IV SOLN
INTRAVENOUS | Status: DC | PRN
  Administered 2016-10-26: 60 mg via INTRAVENOUS

## 2016-10-26 MED ORDER — SODIUM CHLORIDE 0.9% FLUSH
3.0000 mL | Freq: Two times a day (BID) | INTRAVENOUS | Status: DC
  Administered 2016-10-27 (×2): 3 mL via INTRAVENOUS

## 2016-10-26 MED ORDER — FENTANYL CITRATE (PF) 100 MCG/2ML IJ SOLN
INTRAMUSCULAR | Status: AC
  Filled 2016-10-26: qty 2

## 2016-10-26 MED ORDER — ONDANSETRON HCL 4 MG PO TABS
4.0000 mg | ORAL_TABLET | ORAL | Status: DC | PRN
  Administered 2016-10-27: 4 mg via ORAL
  Filled 2016-10-26 (×2): qty 1

## 2016-10-26 MED ORDER — KETOROLAC TROMETHAMINE 15 MG/ML IJ SOLN
INTRAMUSCULAR | Status: AC
  Filled 2016-10-26: qty 1

## 2016-10-26 MED ORDER — LIDOCAINE-EPINEPHRINE 1 %-1:100000 IJ SOLN
INTRAMUSCULAR | Status: DC | PRN
  Administered 2016-10-26: 10 mL

## 2016-10-26 SURGICAL SUPPLY — 63 items
BAG DECANTER FOR FLEXI CONT (MISCELLANEOUS) ×2 IMPLANT
BASKET BONE COLLECTION (BASKET) ×2 IMPLANT
BLADE CLIPPER SURG (BLADE) IMPLANT
BONE EQUIVA 10CC (Bone Implant) ×2 IMPLANT
BUR MATCHSTICK NEURO 3.0 LAGG (BURR) ×2 IMPLANT
CANISTER SUCT 3000ML PPV (MISCELLANEOUS) ×2 IMPLANT
CONT SPEC 4OZ CLIKSEAL STRL BL (MISCELLANEOUS) ×2 IMPLANT
COVER BACK TABLE 60X90IN (DRAPES) ×2 IMPLANT
DECANTER SPIKE VIAL GLASS SM (MISCELLANEOUS) ×2 IMPLANT
DERMABOND ADVANCED (GAUZE/BANDAGES/DRESSINGS) ×1
DERMABOND ADVANCED .7 DNX12 (GAUZE/BANDAGES/DRESSINGS) ×1 IMPLANT
DEVICE DISSECT PLASMABLAD 3.0S (MISCELLANEOUS) ×1 IMPLANT
DRAPE C-ARM 42X72 X-RAY (DRAPES) ×4 IMPLANT
DRAPE HALF SHEET 40X57 (DRAPES) IMPLANT
DRAPE LAPAROTOMY 100X72X124 (DRAPES) ×2 IMPLANT
DRAPE POUCH INSTRU U-SHP 10X18 (DRAPES) ×2 IMPLANT
DRSG OPSITE POSTOP 4X6 (GAUZE/BANDAGES/DRESSINGS) ×4 IMPLANT
DURAPREP 26ML APPLICATOR (WOUND CARE) ×2 IMPLANT
DURASEAL APPLICATOR TIP (TIP) IMPLANT
DURASEAL SPINE SEALANT 3ML (MISCELLANEOUS) IMPLANT
ELECT REM PT RETURN 9FT ADLT (ELECTROSURGICAL) ×2
ELECTRODE REM PT RTRN 9FT ADLT (ELECTROSURGICAL) ×1 IMPLANT
GAUZE SPONGE 4X4 12PLY STRL (GAUZE/BANDAGES/DRESSINGS) IMPLANT
GAUZE SPONGE 4X4 16PLY XRAY LF (GAUZE/BANDAGES/DRESSINGS) IMPLANT
GLOVE BIOGEL PI IND STRL 8.5 (GLOVE) ×2 IMPLANT
GLOVE BIOGEL PI INDICATOR 8.5 (GLOVE) ×2
GLOVE ECLIPSE 7.5 STRL STRAW (GLOVE) ×8 IMPLANT
GLOVE ECLIPSE 8.5 STRL (GLOVE) ×4 IMPLANT
GLOVE INDICATOR 7.5 STRL GRN (GLOVE) ×2 IMPLANT
GLOVE INDICATOR 8.0 STRL GRN (GLOVE) ×2 IMPLANT
GLOVE SURG SS PI 7.0 STRL IVOR (GLOVE) ×4 IMPLANT
GOWN STRL REUS W/ TWL LRG LVL3 (GOWN DISPOSABLE) ×2 IMPLANT
GOWN STRL REUS W/ TWL XL LVL3 (GOWN DISPOSABLE) IMPLANT
GOWN STRL REUS W/TWL 2XL LVL3 (GOWN DISPOSABLE) ×4 IMPLANT
GOWN STRL REUS W/TWL LRG LVL3 (GOWN DISPOSABLE) ×2
GOWN STRL REUS W/TWL XL LVL3 (GOWN DISPOSABLE)
HEMOSTAT POWDER KIT SURGIFOAM (HEMOSTASIS) ×2 IMPLANT
KIT BASIN OR (CUSTOM PROCEDURE TRAY) ×2 IMPLANT
KIT ROOM TURNOVER OR (KITS) ×2 IMPLANT
MILL MEDIUM DISP (BLADE) ×2 IMPLANT
NEEDLE HYPO 22GX1.5 SAFETY (NEEDLE) ×2 IMPLANT
NS IRRIG 1000ML POUR BTL (IV SOLUTION) ×2 IMPLANT
PACK LAMINECTOMY NEURO (CUSTOM PROCEDURE TRAY) ×2 IMPLANT
PAD ARMBOARD 7.5X6 YLW CONV (MISCELLANEOUS) ×6 IMPLANT
PATTIES SURGICAL .5 X1 (DISPOSABLE) ×2 IMPLANT
PLASMABLADE 3.0S (MISCELLANEOUS) ×2
ROD TI ALLOY CVD VIT 5.5X35MM (Rod) ×4 IMPLANT
SCREW VITALITY PA 6.5X45MM (Screw) ×8 IMPLANT
SPACER ZYSTON STR 10X25X10X8 (Spacer) ×4 IMPLANT
SPONGE LAP 4X18 X RAY DECT (DISPOSABLE) IMPLANT
SPONGE SURGIFOAM ABS GEL 100 (HEMOSTASIS) ×2 IMPLANT
SUT PROLENE 6 0 BV (SUTURE) IMPLANT
SUT VIC AB 1 CT1 18XBRD ANBCTR (SUTURE) ×1 IMPLANT
SUT VIC AB 1 CT1 8-18 (SUTURE) ×1
SUT VIC AB 2-0 CP2 18 (SUTURE) ×2 IMPLANT
SUT VIC AB 3-0 SH 8-18 (SUTURE) ×4 IMPLANT
SYR 3ML LL SCALE MARK (SYRINGE) ×8 IMPLANT
TOP CLOSURE TORQ LIMIT (Neuro Prosthesis/Implant) ×8 IMPLANT
TOWEL OR 17X24 6PK STRL BLUE (TOWEL DISPOSABLE) ×2 IMPLANT
TOWEL OR 17X26 10 PK STRL BLUE (TOWEL DISPOSABLE) ×2 IMPLANT
TRAP SPECIMEN MUCOUS 40CC (MISCELLANEOUS) ×2 IMPLANT
TRAY FOLEY W/METER SILVER 16FR (SET/KITS/TRAYS/PACK) ×2 IMPLANT
WATER STERILE IRR 1000ML POUR (IV SOLUTION) ×2 IMPLANT

## 2016-10-26 NOTE — H&P (Signed)
CHIEF COMPLAINT:                                          Back pain, pain, and right-sided sciatica.  HISTORY OF PRESENT ILLNESS:                     Meghan Tucker is a 70 year old, left-handed individual who tells me that she has been having back pain on and off for a number of years but since December of this past year it has been severe with radiating pain and sciatica on that right side.  She finds that certain activities such as yoga would tend to aggravate it and the pain has been persistent despite efforts at limiting her activities.  An MRI of the lumbar spine was completed in March of this year and this study demonstrates that there is a grade I spondylolisthesis at L4-L5.  At L4-L5, there is a high-grade stenosis of the central canal and lateral recesses.  At L5-S1, she has moderate disk degenerative changes with some vacuum disk phenomenon.  There is some very mild facet hypertrophy, but the central canal and lateral recesses are adequately patent.  The other areas of her spine demonstrate that she has some very mild disk degenerative changes at L3-4 and 2-3, but overall she has good maintenance of the alignment of her spine.  She does have a slightly reduced lordosis secondary to the spondylolisthesis at the L4-L5 level.  Meghan Tucker tells me that the pain has been limiting her activity.  She notes that she has some dread or fear that things that she may do could aggravate the pain.  She found that yoga would tend to aggravate the pain and she would lay off of doing some vigorous yoga for three days but then when she would return the pain would recur.  Sitting tends to aggravate the sciatica.  As long as she can keep up some mild activity she tends to be able to manage the pain.  She would like to be more physically active if at all possible, but the pain seems to be holding her back.  REVIEW OF SYSTEMS:                                    A systems review is notable for a history of some cataracts, some  hearing loss, ringing in the ears, back pain, leg pain, and arthritis are all noted on a 14-point review sheet.   PAST MEDICAL HISTORY:                                Her past medical history reveals that her general health has been excellent.  She reports no significant medical problems.  . Medications and Allergies:  Her current medications include some potassium chloride supplement, vitamin D, zolpidem, Zetia, Prolia injections, some fish oil, biotin, calcium citrate and magnesium, zinc and vitamin D3 supplements.  SOCIAL HISTORY:                                            On her personal history, I note that she does not smoke.  She drinks alcohol socially.  PHYSICAL EXAMINATION:                                Height and weight have been stable at 5 feet 1-1/2 inches and 135 pounds.  On physical examination, she stands straight and erect.  She flexes forward easily to touching her fingertips to her toes.  She hyperextends without difficulty.  Her motor strength is good in the iliopsoas, the quads, the tibialis anterior and the gastroc as noted by toe and heel walking.  Reflexes are 2+ and symmetric in the patellae.  Trace in both Achilles.  She does have some slight decrease to vibratory sensation in the left leg compared to the right.  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  Neurologically, she is completely intact.  She has been tolerating these symptoms to a good degree, but I pointed out to her the degree and nature of the stenosis that she has is likely going to result in the need for surgery at some point.           I noted that based on the current studies, I would suggest a one-level decompression and fusion. I discussed the situation with the surgery that involves removing and undercutting the bone to a significant amount, such that the joint would be rendered unstable, and ultimately a fusion needs  to be considered. This would require removal of the entirety of the disc, placement of spacers into the interspace, along with bone graft into the disc space and pedicles screws between L4 and L5. This is much like the surgery that her husband had had, and I would hope that she gets good and prompt relief with this. She is admitted for surgery at this time.

## 2016-10-26 NOTE — Anesthesia Postprocedure Evaluation (Signed)
Anesthesia Post Note  Patient: Meghan Tucker  Procedure(s) Performed: Procedure(s) (LRB): LUMBAR FOUR-FIVE POSTERIOR LUMBAR INTERBODY FUSION (N/A)  Patient location during evaluation: PACU Anesthesia Type: General Level of consciousness: awake and alert Pain management: pain level controlled Vital Signs Assessment: post-procedure vital signs reviewed and stable Respiratory status: spontaneous breathing, nonlabored ventilation, respiratory function stable and patient connected to nasal cannula oxygen Cardiovascular status: blood pressure returned to baseline and stable Postop Assessment: no signs of nausea or vomiting Anesthetic complications: no    Last Vitals:  Vitals:   10/26/16 1530 10/26/16 1535  BP:  123/90  Pulse: 72 85  Resp: (!) 8 15  Temp:      Last Pain:  Vitals:   10/26/16 1530  TempSrc:   PainSc: Asleep                 Reginal Lutes

## 2016-10-26 NOTE — Anesthesia Preprocedure Evaluation (Signed)
Anesthesia Evaluation  Patient identified by MRN, date of birth, ID band Patient awake    Reviewed: Allergy & Precautions, NPO status   History of Anesthesia Complications Negative for: history of anesthetic complications  Airway Mallampati: II  TM Distance: >3 FB Neck ROM: Full    Dental  (+) Teeth Intact   Pulmonary neg pulmonary ROS,    breath sounds clear to auscultation       Cardiovascular hypertension, Pt. on medications negative cardio ROS   Rhythm:Regular Rate:Normal     Neuro/Psych  Headaches,    GI/Hepatic Neg liver ROS, hiatal hernia, GERD  Medicated,  Endo/Other    Renal/GU negative Renal ROS     Musculoskeletal  (+) Arthritis ,   Abdominal   Peds  Hematology negative hematology ROS (+)   Anesthesia Other Findings   Reproductive/Obstetrics                             Anesthesia Physical  Anesthesia Plan  ASA: II  Anesthesia Plan: General   Post-op Pain Management:    Induction: Intravenous  Airway Management Planned: Oral ETT  Additional Equipment:   Intra-op Plan:   Post-operative Plan: Extubation in OR  Informed Consent: I have reviewed the patients History and Physical, chart, labs and discussed the procedure including the risks, benefits and alternatives for the proposed anesthesia with the patient or authorized representative who has indicated his/her understanding and acceptance.   Dental advisory given  Plan Discussed with: CRNA and Surgeon  Anesthesia Plan Comments:         Anesthesia Quick Evaluation

## 2016-10-26 NOTE — Transfer of Care (Signed)
Immediate Anesthesia Transfer of Care Note  Patient: Meghan Tucker  Procedure(s) Performed: Procedure(s): LUMBAR FOUR-FIVE POSTERIOR LUMBAR INTERBODY FUSION (N/A)  Patient Location: PACU  Anesthesia Type:General  Level of Consciousness: awake, alert  and oriented  Airway & Oxygen Therapy: Patient Spontanous Breathing and Patient connected to nasal cannula oxygen  Post-op Assessment: Report given to RN and Post -op Vital signs reviewed and stable  Post vital signs: Reviewed and stable  Last Vitals:  Vitals:   10/26/16 0856 10/26/16 1436  BP: (!) 182/78   Pulse: 69   Resp: 18   Temp: 36.8 C (P) 36.9 C    Last Pain:  Vitals:   10/26/16 0856  TempSrc: Oral      Patients Stated Pain Goal: 6 ([id number])  Complications: No apparent anesthesia complications

## 2016-10-26 NOTE — Op Note (Addendum)
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 Is a murmur I/O met hardware with Zyston interbody cage and vitality screws  Surgeon: Kristeen Miss M.D.  Asst.: Ashley Jacobs M.D.  Indications: Patient is Meghan Tucker is a 70 y.o. female who who's had significant back pain and lumbar radiculopathy for over a years period time. A lumbar MRI demonstrates advanced spondylolisthesis with high-grade canal stenosis. she was advised regarding surgical intervention.  Procedure: The patient was brought to the operating room supine on a stretcher. After the smooth induction of general endotracheal anesthesia she was turned prone and the back was prepped with alcohol and DuraPrep. The back was then draped sterilely. A midline incision was created and carried down to the lumbar dorsal fascia. A localizing radiograph identified the L4 and L5 spinous processes. A subligamentous dissection was created at L4 and L5 to expose the interlaminar space at L4 and L5 and the facet joints over the L4-L5 interspace. Laminotomies were were then created removing the entire inferior margin of the lamina of L4 including the inferior facet at the L4-L5 joint. The yellow ligament was taken up and the common dural tube was exposed along with the L4 nerve root superiorly, and the L5 nerve root inferiorly, the disc space was exposed and epidural veins in this region were cauterized and divided. The L4 nerve roots and the L5 nerve root were dissected with care taken to protect them. The disc space was opened and a combination of curettes and rongeurs was used to evacuate the disc space  fully. The endplates were removed using sharp curettes. An interbody spacer was placed to distract the disc space while the contralateral discectomy was performed. When the entirety of the disc was removed and the endplates were prepared final sizing of the disc space was obtained 10 mm peek spacers were chosen and these were 25 mm in length and width 8 of lordosis and packed with autograft and allograft and placed into the interspace. The remainder of the interspace was packed with autograft and allograft for total volume of 9 mL. Pedicle entry sites were then chosen using fluoroscopic guidance and 6.5 x 45 mm screws were placed in L4 and 6.5 x 45 mm screws were placed in L5. The lateral gutters were decorticated and graft was packed in the posterolateral gutters between L4 and L5. Final radiographs were obtained after placing appropriately sized rods between the pedicle screws at L4-L5 and torquing these to the appropriate tension. The surgical site was inspected carefully to assure the L4 and L5 nerve roots were well decompressed, hemostasis was obtained, and the graft was well packed. Then the retractors were removed and the wound was closed with #1 Vicryl in the lumbar dorsal fascia 2-0 Vicryl in the subcutaneous tissue and 3-0 Vicryl subcuticularly. When he cc of half percent Marcaine was injected into the paraspinous musculature at the time of closure. Blood loss was estimated at 250 cc. The patient tolerated procedure well and was returned to the recovery room in stable condition.

## 2016-10-26 NOTE — Anesthesia Procedure Notes (Signed)
Procedure Name: Intubation Date/Time: 10/26/2016 11:08 AM Performed by: Clearnce Sorrel Pre-anesthesia Checklist: Patient identified, Emergency Drugs available, Suction available, Patient being monitored and Timeout performed Patient Re-evaluated:Patient Re-evaluated prior to inductionOxygen Delivery Method: Circle system utilized Preoxygenation: Pre-oxygenation with 100% oxygen Intubation Type: IV induction Ventilation: Mask ventilation without difficulty Laryngoscope Size: Mac and 3 Grade View: Grade II Tube type: Oral Tube size: 7.0 mm Number of attempts: 1 Airway Equipment and Method: Stylet Placement Confirmation: ETT inserted through vocal cords under direct vision,  positive ETCO2,  CO2 detector and breath sounds checked- equal and bilateral Secured at: 22 cm Tube secured with: Tape Dental Injury: Teeth and Oropharynx as per pre-operative assessment  Comments: Inserted by Collene Mares SRNA

## 2016-10-27 MED ORDER — DIAZEPAM 5 MG PO TABS: 5.0000 mg | ORAL_TABLET | Freq: Four times a day (QID) | ORAL | 0 refills | Status: DC | PRN

## 2016-10-27 MED ORDER — OXYCODONE-ACETAMINOPHEN 5-325 MG PO TABS: 1.0000 | ORAL_TABLET | ORAL | 0 refills | Status: DC | PRN

## 2016-10-27 MED ORDER — DEXAMETHASONE SODIUM PHOSPHATE 4 MG/ML IJ SOLN
4.0000 mg | Freq: Once | INTRAMUSCULAR | Status: AC
  Administered 2016-10-27: 4 mg via INTRAVENOUS
  Filled 2016-10-27: qty 1

## 2016-10-27 MED FILL — Sodium Chloride IV Soln 0.9%: INTRAVENOUS | Qty: 1000 | Status: AC

## 2016-10-27 MED FILL — Heparin Sodium (Porcine) Inj 1000 Unit/ML: INTRAMUSCULAR | Qty: 30 | Status: AC

## 2016-10-27 NOTE — Evaluation (Signed)
Occupational Therapy Evaluation Patient Details Name: Meghan Tucker MRN: [id number] DOB: 09/11/1946 Today's Date: 10/27/2016    History of Present Illness 70 yo female s/p L4-5 fusion PMH:  Past Medical History:  Diagnosis Date  . Arthritis    "all over"  . Chronic back pain    2 buldging disc,stenosis  . Diverticulosis   . GERD (gastroesophageal reflux disease)    takes Omeprazole daily  . History of hiatal hernia   . History of kidney stones   . History of kidney stones   . History of migraine    none since menopause yrs ago  . Hyperlipidemia    takes Fish Oil daily  . Hypertension    takes Metoprolol daily  . Insomnia    takes Ambien nightly  . Sciatica   . Sciatica   . Tingling    right arm.Both feet       Clinical Impression   Patient evaluated by Occupational Therapy with no further acute OT needs identified. All education has been completed and the patient has no further questions. See below for any follow-up Occupational Therapy or equipment needs. OT to sign off. Thank you for referral.      Follow Up Recommendations  No OT follow up    Equipment Recommendations  None recommended by OT    Recommendations for Other Services       Precautions / Restrictions Precautions Precautions: Back Precaution Comments: handout provided and reviewed in detail Required Braces or Orthoses: Spinal Brace Spinal Brace: Lumbar corset;Applied in sitting position      Mobility Bed Mobility               General bed mobility comments: in chair on arrival. Reports no difficulty   Transfers Overall transfer level: Modified independent                    Balance                                            ADL Overall ADL's : Modified independent   Back handout provided and reviewed adls in detail. Pt educated on: clothing between brace, never sleep in brace, set an alarm at night for medication, avoid sitting for long periods of  time, correct bed positioning for sleeping, correct sequence for bed mobility, avoiding lifting more than 5 pounds and never wash directly over incision. All education is complete and patient indicates understanding.                                             Vision     Perception     Praxis      Pertinent Vitals/Pain Pain Assessment: 0-10 Pain Score: 3  Pain Location: back Pain Descriptors / Indicators: Operative site guarding Pain Intervention(s): Limited activity within patient's tolerance;Premedicated before session;Repositioned     Hand Dominance Right   Extremity/Trunk Assessment Upper Extremity Assessment Upper Extremity Assessment: Overall WFL for tasks assessed   Lower Extremity Assessment Lower Extremity Assessment: Defer to PT evaluation RLE Deficits / Details: Increased pain when attempting to lead with RLE going up the stairs,    Cervical / Trunk Assessment Cervical / Trunk Assessment: Other exceptions (s/p back surg)   Communication Communication Communication:  No difficulties   Cognition Arousal/Alertness: Awake/alert Behavior During Therapy: WFL for tasks assessed/performed Overall Cognitive Status: Within Functional Limits for tasks assessed                     General Comments       Exercises       Shoulder Instructions      Home Living Family/patient expects to be discharged to:: Private residence Living Arrangements: Spouse/significant other Available Help at Discharge: Family;Available 24 hours/day Type of Home: House Home Access: Stairs to enter [employer] of Steps: 2 Entrance Stairs-Rails: None Home Layout: Two level Alternate Level Stairs-Number of Steps: 2 up to master bedroom, 2 down into sunken den Alternate Level Stairs-Rails: Left Bathroom Shower/Tub: Occupational psychologist: Standard Bathroom Accessibility: Yes   Home Equipment: Environmental consultant - 2 wheels          Prior  Functioning/Environment Level of Independence: Independent                 OT Problem List:     OT Treatment/Interventions:      OT Goals(Current goals can be found in the care plan section)    OT Frequency:     Barriers to D/C:            Co-evaluation              End of Session Equipment Utilized During Treatment: Gait belt;Back brace Nurse Communication: Mobility status;Precautions  Activity Tolerance: Patient tolerated treatment well Patient left: in chair;with call bell/phone within reach   Time: 0829-0841 OT Time Calculation (min): 12 min Charges:  OT General Charges $OT Visit: 1 Procedure OT Evaluation $OT Eval Moderate Complexity: 1 Procedure G-Codes:    Parke Poisson B 11/25/16, 9:10 AM   Jeri Modena   OTR/L PagerIP:[phone] Office: [phone] .

## 2016-10-27 NOTE — Progress Notes (Signed)
Patient ID: Meghan Tucker, female   DOB: September 27, 1946, 70 y.o.   MRN: [id number] Vital signs stable Doing well Motor function intact Mobilizing well Plan DC tomorrow.

## 2016-10-27 NOTE — Discharge Summary (Addendum)
Physician Discharge Summary  Patient ID: Meghan Tucker MRN: [id number] DOB/AGE: Dec 27, 1946 70 y.o.  Admit date: 10/26/2016 Discharge date: 10/28/2016  Admission Diagnoses:L4-5 spondylolisthesis with radiculopathy  Discharge Diagnoses: L4-5 Spondylolisthesis with radiculopathy Active Problems:   Spondylolisthesis of lumbar region   Discharged Condition: good  Hospital Course: tolerated surgery well.  Consults: None  Significant Diagnostic Studies: none  Treatments: see op note  Discharge Exam: Blood pressure 124/66, pulse 86, temperature 98.8 F (37.1 C), resp. rate 18, weight 57.2 kg (126 lb), SpO2 99 %. incision clean and dry motor function and gait normal  Disposition: 01-Home or Self Care  Discharge Instructions    Call MD for:  redness, tenderness, or signs of infection (pain, swelling, redness, odor or green/yellow discharge around incision site)    Complete by:  As directed    Call MD for:  severe uncontrolled pain    Complete by:  As directed    Call MD for:  temperature >100.4    Complete by:  As directed    Diet - low sodium heart healthy    Complete by:  As directed    Discharge instructions    Complete by:  As directed    Okay to shower. Do not apply salves or appointments to incision. No heavy lifting with the upper extremities greater than 15 pounds. May resume driving when not requiring pain medication and patient feels comfortable with doing so.   Increase activity slowly    Complete by:  As directed        Medication List    TAKE these medications   AMBIEN 10 MG tablet Generic drug:  zolpidem Take 5 mg by mouth at bedtime.   AZO-CRANBERRY PO Take 1 tablet by mouth daily.   BIOTIN 5000 5 MG Caps Generic drug:  Biotin Take 5 mg by mouth daily.   denosumab 60 MG/ML Soln injection Commonly known as:  PROLIA Inject 60 mg into the skin every 6 (six) months. Twice a year   diazepam 5 MG tablet Commonly known as:  VALIUM Take 1 tablet (5 mg  total) by mouth every 6 (six) hours as needed for anxiety or muscle spasms.   ergocalciferol 50000 units capsule Commonly known as:  VITAMIN D2 Take 50,000 Units by mouth every Friday.   Fish Oil 1000 MG Caps Take 1,000 mg by mouth daily.   Ginkgo Biloba 60 MG Caps Take 60 mg by mouth every evening.   GNP CAL MAG ZINC +D3 Tabs Take 1 tablet by mouth every 36 (thirty-six) hours.   metoprolol succinate 25 MG 24 hr tablet Commonly known as:  TOPROL-XL Take 25 mg by mouth daily.   mometasone 50 MCG/ACT nasal spray Commonly known as:  NASONEX Place 2 sprays into the nose daily as needed (for nasal congestion).   omeprazole 20 MG tablet Commonly known as:  PRILOSEC OTC Take 20 mg by mouth every other day.   OVER THE COUNTER MEDICATION Place 2 drops under the tongue daily. HEMP DROPS   oxyCODONE-acetaminophen 5-325 MG tablet Commonly known as:  PERCOCET/ROXICET Take 0.5 tablets by mouth 2 (two) times daily. What changed:  Another medication with the same name was added. Make sure you understand how and when to take each.   oxyCODONE-acetaminophen 5-325 MG tablet Commonly known as:  PERCOCET/ROXICET Take 1-2 tablets by mouth every 4 (four) hours as needed for moderate pain. What changed:  You were already taking a medication with the same name, and this prescription was added. Make sure  you understand how and when to take each.   potassium chloride 10 MEQ tablet Commonly known as:  K-DUR,KLOR-CON Take 10 mEq by mouth daily.   SYSTANE 0.4-0.3 % Soln Generic drug:  Polyethyl Glycol-Propyl Glycol Place 1 drop into both eyes 3 (three) times daily as needed (for dry eyes).        SignedEarleen Newport 10/27/2016, 9:38 AM

## 2016-10-27 NOTE — Evaluation (Signed)
Physical Therapy Evaluation Patient Details Name: Meghan Tucker MRN: [id number] DOB: [birth year] Today's Date: 10/27/2016   History of Present Illness  70 yo female s/p L4-5 fusion  Clinical Impression  Patient evaluated by Physical Therapy with no further acute PT needs identified. All education has been completed and the patient has no further questions. At the time of PT eval pt was able to perform transfers and ambulation with modified independence. See below for any follow-up Physical Therapy or equipment needs. PT is signing off. Thank you for this referral.     Follow Up Recommendations No PT follow up;Supervision - Intermittent    Equipment Recommendations  None recommended by PT    Recommendations for Other Services       Precautions / Restrictions Precautions Precautions: Back Precaution Booklet Issued: Yes (comment) Precaution Comments: Reviewed handout and pt was cued for precautiosn during functional mobility Required Braces or Orthoses: Spinal Brace Spinal Brace: Lumbar corset;Applied in sitting position Restrictions Weight Bearing Restrictions: No      Mobility  Bed Mobility Overal bed mobility: Modified Independent             General bed mobility comments: in chair on arrival. Reports no difficulty   Transfers Overall transfer level: Modified independent Equipment used: None Transfers: Sit to/from Stand              Ambulation/Gait Ambulation/Gait assistance: Modified independent (Device/Increase time) Ambulation Distance (Feet): 300 Feet Assistive device: None Gait Pattern/deviations: Step-through pattern;Decreased stride length Gait velocity: Decreased Gait velocity interpretation: Below normal speed for age/gender General Gait Details: Slow but steady gait. No assist required.   Stairs Stairs: Yes Stairs assistance: Modified independent (Device/Increase time) Stair Management: One rail Left;Step to pattern;Forwards Number of Stairs:  2 General stair comments: VC's for general safety. No assist required.   Wheelchair Mobility    Modified Rankin (Stroke Patients Only)       Balance Overall balance assessment: No apparent balance deficits (not formally assessed)                                           Pertinent Vitals/Pain Pain Assessment: 0-10 Pain Score: 3  Pain Location: back Pain Descriptors / Indicators: Operative site guarding Pain Intervention(s): Limited activity within patient's tolerance;Premedicated before session;Repositioned    Home Living Family/patient expects to be discharged to:: Private residence Living Arrangements: Spouse/significant other Available Help at Discharge: Family;Available 24 hours/day Type of Home: House Home Access: Stairs to enter Entrance Stairs-Rails: None Entrance Stairs-Number of Steps: 2 Home Layout: Two level Home Equipment: Walker - 2 wheels      Prior Function Level of Independence: Independent               Hand Dominance   Dominant Hand: Right    Extremity/Trunk Assessment   Upper Extremity Assessment: Overall WFL for tasks assessed           Lower Extremity Assessment: Defer to PT evaluation RLE Deficits / Details: Increased pain when attempting to lead with RLE going up the stairs    Cervical / Trunk Assessment: Other exceptions (s/p back surg)  Communication   Communication: No difficulties  Cognition Arousal/Alertness: Awake/alert Behavior During Therapy: WFL for tasks assessed/performed Overall Cognitive Status: Within Functional Limits for tasks assessed  General Comments      Exercises     Assessment/Plan    PT Assessment Patent does not need any further PT services  PT Problem List            PT Treatment Interventions      PT Goals (Current goals can be found in the Care Plan section)  Acute Rehab PT Goals PT Goal Formulation: All assessment and education  complete, DC therapy    Frequency     Barriers to discharge        Co-evaluation               End of Session Equipment Utilized During Treatment: Back brace Activity Tolerance: No increased pain;Patient tolerated treatment well Patient left: in chair;with call bell/phone within reach Nurse Communication: Mobility status         Time: 0755-0809 PT Time Calculation (min) (ACUTE ONLY): 14 min   Charges:   PT Evaluation $PT Eval Moderate Complexity: 1 Procedure     PT G Codes:        Rolinda Roan 11/04/16, 9:43 AM   Rolinda Roan, PT, DPT Acute Rehabilitation Services Pager: [phone]

## 2016-10-28 NOTE — Progress Notes (Signed)
Pt doing well. Pt and husband given D/C instructions with Rx's, verbal understanding was provided. Pt's incision is clean and dry with no sign of infection. Pt's IV was removed prior to D/C. Pt D/C'd home via wheelchair @ 1015 per MD order. Pt is stable @ D/C and has no other needs at this time. Holli Humbles, RN

## 2016-11-17 DIAGNOSIS — M4316 Spondylolisthesis, lumbar region: Secondary | ICD-10-CM | POA: Diagnosis not present

## 2016-12-03 DIAGNOSIS — J018 Other acute sinusitis: Secondary | ICD-10-CM | POA: Diagnosis not present

## 2016-12-17 DIAGNOSIS — M5412 Radiculopathy, cervical region: Secondary | ICD-10-CM | POA: Diagnosis not present

## 2016-12-17 DIAGNOSIS — I10 Essential (primary) hypertension: Secondary | ICD-10-CM | POA: Diagnosis not present

## 2016-12-17 DIAGNOSIS — M48062 Spinal stenosis, lumbar region with neurogenic claudication: Secondary | ICD-10-CM | POA: Diagnosis not present

## 2016-12-17 DIAGNOSIS — E782 Mixed hyperlipidemia: Secondary | ICD-10-CM | POA: Diagnosis not present

## 2016-12-17 DIAGNOSIS — M25511 Pain in right shoulder: Secondary | ICD-10-CM | POA: Diagnosis not present

## 2016-12-28 DIAGNOSIS — M50321 Other cervical disc degeneration at C4-C5 level: Secondary | ICD-10-CM | POA: Diagnosis not present

## 2016-12-28 DIAGNOSIS — M25511 Pain in right shoulder: Secondary | ICD-10-CM | POA: Diagnosis not present

## 2016-12-28 DIAGNOSIS — M5412 Radiculopathy, cervical region: Secondary | ICD-10-CM | POA: Diagnosis not present

## 2016-12-28 DIAGNOSIS — M5030 Other cervical disc degeneration, unspecified cervical region: Secondary | ICD-10-CM | POA: Diagnosis not present

## 2016-12-28 DIAGNOSIS — M19011 Primary osteoarthritis, right shoulder: Secondary | ICD-10-CM | POA: Diagnosis not present

## 2016-12-28 DIAGNOSIS — M50322 Other cervical disc degeneration at C5-C6 level: Secondary | ICD-10-CM | POA: Diagnosis not present

## 2017-01-04 DIAGNOSIS — M19011 Primary osteoarthritis, right shoulder: Secondary | ICD-10-CM | POA: Diagnosis not present

## 2017-01-19 DIAGNOSIS — L84 Corns and callosities: Secondary | ICD-10-CM | POA: Insufficient documentation

## 2017-01-19 DIAGNOSIS — M2042 Other hammer toe(s) (acquired), left foot: Secondary | ICD-10-CM | POA: Insufficient documentation

## 2017-02-18 DIAGNOSIS — M159 Polyosteoarthritis, unspecified: Secondary | ICD-10-CM | POA: Diagnosis not present

## 2017-02-18 DIAGNOSIS — E7800 Pure hypercholesterolemia, unspecified: Secondary | ICD-10-CM | POA: Diagnosis not present

## 2017-02-18 DIAGNOSIS — M659 Synovitis and tenosynovitis, unspecified: Secondary | ICD-10-CM | POA: Diagnosis not present

## 2017-02-18 DIAGNOSIS — Z6823 Body mass index (BMI) 23.0-23.9, adult: Secondary | ICD-10-CM | POA: Diagnosis not present

## 2017-02-18 DIAGNOSIS — G4700 Insomnia, unspecified: Secondary | ICD-10-CM | POA: Diagnosis not present

## 2017-02-18 DIAGNOSIS — J309 Allergic rhinitis, unspecified: Secondary | ICD-10-CM | POA: Diagnosis not present

## 2017-02-21 DIAGNOSIS — F321 Major depressive disorder, single episode, moderate: Secondary | ICD-10-CM | POA: Diagnosis not present

## 2017-02-21 DIAGNOSIS — K580 Irritable bowel syndrome with diarrhea: Secondary | ICD-10-CM | POA: Diagnosis not present

## 2017-02-21 DIAGNOSIS — F5101 Primary insomnia: Secondary | ICD-10-CM | POA: Diagnosis not present

## 2017-02-21 DIAGNOSIS — J00 Acute nasopharyngitis [common cold]: Secondary | ICD-10-CM | POA: Diagnosis not present

## 2017-02-22 DIAGNOSIS — Z23 Encounter for immunization: Secondary | ICD-10-CM | POA: Diagnosis not present

## 2017-03-15 DIAGNOSIS — F5101 Primary insomnia: Secondary | ICD-10-CM | POA: Diagnosis not present

## 2017-03-15 DIAGNOSIS — F321 Major depressive disorder, single episode, moderate: Secondary | ICD-10-CM | POA: Diagnosis not present

## 2017-03-17 DIAGNOSIS — F320 Major depressive disorder, single episode, mild: Secondary | ICD-10-CM | POA: Diagnosis not present

## 2017-03-17 DIAGNOSIS — J060 Acute laryngopharyngitis: Secondary | ICD-10-CM | POA: Diagnosis not present

## 2017-03-17 DIAGNOSIS — F5101 Primary insomnia: Secondary | ICD-10-CM | POA: Diagnosis not present

## 2017-03-21 DIAGNOSIS — J018 Other acute sinusitis: Secondary | ICD-10-CM | POA: Diagnosis not present

## 2017-03-22 DIAGNOSIS — R0789 Other chest pain: Secondary | ICD-10-CM | POA: Diagnosis not present

## 2017-03-22 DIAGNOSIS — I10 Essential (primary) hypertension: Secondary | ICD-10-CM | POA: Diagnosis not present

## 2017-03-22 DIAGNOSIS — J060 Acute laryngopharyngitis: Secondary | ICD-10-CM | POA: Diagnosis not present

## 2017-03-22 DIAGNOSIS — E782 Mixed hyperlipidemia: Secondary | ICD-10-CM | POA: Diagnosis not present

## 2017-03-23 DIAGNOSIS — R197 Diarrhea, unspecified: Secondary | ICD-10-CM | POA: Diagnosis not present

## 2017-03-23 DIAGNOSIS — K219 Gastro-esophageal reflux disease without esophagitis: Secondary | ICD-10-CM | POA: Diagnosis not present

## 2017-03-31 DIAGNOSIS — R197 Diarrhea, unspecified: Secondary | ICD-10-CM | POA: Diagnosis not present

## 2017-04-22 DIAGNOSIS — Z1231 Encounter for screening mammogram for malignant neoplasm of breast: Secondary | ICD-10-CM | POA: Diagnosis not present

## 2017-04-22 DIAGNOSIS — M810 Age-related osteoporosis without current pathological fracture: Secondary | ICD-10-CM | POA: Diagnosis not present

## 2017-04-22 DIAGNOSIS — N959 Unspecified menopausal and perimenopausal disorder: Secondary | ICD-10-CM | POA: Diagnosis not present

## 2017-05-03 DIAGNOSIS — Z961 Presence of intraocular lens: Secondary | ICD-10-CM

## 2017-05-03 DIAGNOSIS — H26493 Other secondary cataract, bilateral: Secondary | ICD-10-CM | POA: Insufficient documentation

## 2017-05-03 DIAGNOSIS — H524 Presbyopia: Secondary | ICD-10-CM | POA: Insufficient documentation

## 2017-05-03 DIAGNOSIS — H04123 Dry eye syndrome of bilateral lacrimal glands: Secondary | ICD-10-CM | POA: Diagnosis not present

## 2017-05-03 DIAGNOSIS — H04129 Dry eye syndrome of unspecified lacrimal gland: Secondary | ICD-10-CM | POA: Insufficient documentation

## 2017-05-03 HISTORY — DX: Presence of intraocular lens: Z96.1

## 2017-05-04 DIAGNOSIS — L84 Corns and callosities: Secondary | ICD-10-CM | POA: Diagnosis not present

## 2017-05-04 DIAGNOSIS — M2042 Other hammer toe(s) (acquired), left foot: Secondary | ICD-10-CM | POA: Diagnosis not present

## 2017-05-12 DIAGNOSIS — Z6822 Body mass index (BMI) 22.0-22.9, adult: Secondary | ICD-10-CM | POA: Diagnosis not present

## 2017-05-12 DIAGNOSIS — Z0000 Encounter for general adult medical examination without abnormal findings: Secondary | ICD-10-CM | POA: Diagnosis not present

## 2017-05-12 DIAGNOSIS — D485 Neoplasm of uncertain behavior of skin: Secondary | ICD-10-CM | POA: Diagnosis not present

## 2017-06-01 DIAGNOSIS — M810 Age-related osteoporosis without current pathological fracture: Secondary | ICD-10-CM | POA: Diagnosis not present

## 2017-06-09 DIAGNOSIS — K648 Other hemorrhoids: Secondary | ICD-10-CM | POA: Diagnosis not present

## 2017-06-09 DIAGNOSIS — R143 Flatulence: Secondary | ICD-10-CM | POA: Diagnosis not present

## 2017-06-09 DIAGNOSIS — Z1211 Encounter for screening for malignant neoplasm of colon: Secondary | ICD-10-CM | POA: Diagnosis not present

## 2017-06-09 DIAGNOSIS — K5730 Diverticulosis of large intestine without perforation or abscess without bleeding: Secondary | ICD-10-CM | POA: Diagnosis not present

## 2017-06-09 DIAGNOSIS — K449 Diaphragmatic hernia without obstruction or gangrene: Secondary | ICD-10-CM | POA: Diagnosis not present

## 2017-06-09 DIAGNOSIS — K219 Gastro-esophageal reflux disease without esophagitis: Secondary | ICD-10-CM | POA: Diagnosis not present

## 2017-06-09 DIAGNOSIS — R197 Diarrhea, unspecified: Secondary | ICD-10-CM | POA: Diagnosis not present

## 2017-07-05 DIAGNOSIS — Z1289 Encounter for screening for malignant neoplasm of other sites: Secondary | ICD-10-CM | POA: Diagnosis not present

## 2017-07-05 DIAGNOSIS — M810 Age-related osteoporosis without current pathological fracture: Secondary | ICD-10-CM | POA: Diagnosis not present

## 2017-07-05 DIAGNOSIS — N952 Postmenopausal atrophic vaginitis: Secondary | ICD-10-CM | POA: Diagnosis not present

## 2017-07-05 DIAGNOSIS — Z780 Asymptomatic menopausal state: Secondary | ICD-10-CM | POA: Diagnosis not present

## 2017-07-05 DIAGNOSIS — Z01419 Encounter for gynecological examination (general) (routine) without abnormal findings: Secondary | ICD-10-CM | POA: Diagnosis not present

## 2017-07-12 DIAGNOSIS — K219 Gastro-esophageal reflux disease without esophagitis: Secondary | ICD-10-CM | POA: Diagnosis not present

## 2017-07-19 DIAGNOSIS — L814 Other melanin hyperpigmentation: Secondary | ICD-10-CM | POA: Diagnosis not present

## 2017-07-19 DIAGNOSIS — L710 Perioral dermatitis: Secondary | ICD-10-CM | POA: Diagnosis not present

## 2017-07-19 DIAGNOSIS — R208 Other disturbances of skin sensation: Secondary | ICD-10-CM | POA: Diagnosis not present

## 2017-07-19 DIAGNOSIS — L578 Other skin changes due to chronic exposure to nonionizing radiation: Secondary | ICD-10-CM | POA: Diagnosis not present

## 2017-07-19 DIAGNOSIS — L918 Other hypertrophic disorders of the skin: Secondary | ICD-10-CM | POA: Diagnosis not present

## 2017-07-19 DIAGNOSIS — L821 Other seborrheic keratosis: Secondary | ICD-10-CM | POA: Diagnosis not present

## 2017-07-19 DIAGNOSIS — D692 Other nonthrombocytopenic purpura: Secondary | ICD-10-CM | POA: Diagnosis not present

## 2017-08-12 DIAGNOSIS — G5762 Lesion of plantar nerve, left lower limb: Secondary | ICD-10-CM | POA: Diagnosis not present

## 2017-08-12 DIAGNOSIS — L2489 Irritant contact dermatitis due to other agents: Secondary | ICD-10-CM | POA: Diagnosis not present

## 2017-08-12 DIAGNOSIS — R202 Paresthesia of skin: Secondary | ICD-10-CM | POA: Diagnosis not present

## 2017-08-12 DIAGNOSIS — R233 Spontaneous ecchymoses: Secondary | ICD-10-CM | POA: Diagnosis not present

## 2017-08-18 DIAGNOSIS — I10 Essential (primary) hypertension: Secondary | ICD-10-CM | POA: Diagnosis not present

## 2017-08-18 DIAGNOSIS — E782 Mixed hyperlipidemia: Secondary | ICD-10-CM | POA: Diagnosis not present

## 2017-08-31 ENCOUNTER — Ambulatory Visit: Payer: Self-pay | Admitting: Sports Medicine

## 2017-09-08 ENCOUNTER — Ambulatory Visit (INDEPENDENT_AMBULATORY_CARE_PROVIDER_SITE_OTHER): Payer: Medicare Other

## 2017-09-08 ENCOUNTER — Encounter: Payer: Self-pay | Admitting: Sports Medicine

## 2017-09-08 ENCOUNTER — Encounter (INDEPENDENT_AMBULATORY_CARE_PROVIDER_SITE_OTHER): Payer: Self-pay

## 2017-09-08 ENCOUNTER — Ambulatory Visit (INDEPENDENT_AMBULATORY_CARE_PROVIDER_SITE_OTHER): Payer: Medicare Other | Admitting: Sports Medicine

## 2017-09-08 DIAGNOSIS — L853 Xerosis cutis: Secondary | ICD-10-CM

## 2017-09-08 DIAGNOSIS — M79672 Pain in left foot: Secondary | ICD-10-CM

## 2017-09-08 DIAGNOSIS — M2040 Other hammer toe(s) (acquired), unspecified foot: Secondary | ICD-10-CM

## 2017-09-08 DIAGNOSIS — J018 Other acute sinusitis: Secondary | ICD-10-CM | POA: Diagnosis not present

## 2017-09-08 DIAGNOSIS — M79671 Pain in right foot: Secondary | ICD-10-CM | POA: Diagnosis not present

## 2017-09-08 DIAGNOSIS — Z86018 Personal history of other benign neoplasm: Secondary | ICD-10-CM | POA: Diagnosis not present

## 2017-09-08 NOTE — Progress Notes (Signed)
Subjective: Meghan Tucker is a 71 y.o. female patient who presents to office for evaluation of bilateral foot pain. Patient complains of continued pain at toes admits to history of hammer toe surgery with screws on the left 2-3 toes and still pain at toes describes it as a ache and states that she also wants to discuss the corn that she has on the 5th toe and dry skin. Patient admits that her PCP gave her an injection of a neuroma and that area feels better. Patient denies any other pedal complaints.   Review of Systems  Constitutional: Negative.   HENT: Negative.   Eyes: Negative.   Respiratory: Negative.   Cardiovascular: Negative.   Gastrointestinal: Negative.   Genitourinary: Negative.   Musculoskeletal: Positive for joint pain.  Skin:       Dry skin both feet Blister that is better now  Neurological: Positive for tingling.       At top of left foot at 3-4 toes that is better after steroid injection by PCP  Endo/Heme/Allergies: Negative.   Psychiatric/Behavioral: Negative.     Patient Active Problem List   Diagnosis Date Noted  . Spondylolisthesis of lumbar region 10/26/2016  . Nausea alone 10/12/2013  . GERD (gastroesophageal reflux disease) 10/12/2013    Current Outpatient Prescriptions on File Prior to Visit  Medication Sig Dispense Refill  . Biotin (BIOTIN 5000) 5 MG CAPS Take 5 mg by mouth daily.    Marland Kitchen denosumab (PROLIA) 60 MG/ML SOLN injection Inject 60 mg into the skin every 6 (six) months. Twice a year    . diazepam (VALIUM) 5 MG tablet Take 1 tablet (5 mg total) by mouth every 6 (six) hours as needed for anxiety or muscle spasms. 60 tablet 0  . ergocalciferol (VITAMIN D2) 50000 units capsule Take 50,000 Units by mouth every Friday.    . Ginkgo Biloba 60 MG CAPS Take 60 mg by mouth every evening.    . metoprolol succinate (TOPROL-XL) 25 MG 24 hr tablet Take 25 mg by mouth daily.    . mometasone (NASONEX) 50 MCG/ACT nasal spray Place 2 sprays into the nose daily as needed  (for nasal congestion).    . Multiple Minerals-Vitamins (GNP CAL MAG ZINC +D3) TABS Take 1 tablet by mouth every 36 (thirty-six) hours.    . Omega-3 Fatty Acids (FISH OIL) 1000 MG CAPS Take 1,000 mg by mouth daily.     Vladimir Faster Glycol-Propyl Glycol (SYSTANE) 0.4-0.3 % SOLN Place 1 drop into both eyes 3 (three) times daily as needed (for dry eyes).    . potassium chloride (K-DUR,KLOR-CON) 10 MEQ tablet Take 10 mEq by mouth daily.    . AZO-CRANBERRY PO Take 1 tablet by mouth daily.    Marland Kitchen omeprazole (PRILOSEC OTC) 20 MG tablet Take 20 mg by mouth every other day.    Marland Kitchen OVER THE COUNTER MEDICATION Place 2 drops under the tongue daily. HEMP DROPS    . oxyCODONE-acetaminophen (PERCOCET/ROXICET) 5-325 MG tablet Take 0.5 tablets by mouth 2 (two) times daily.    Marland Kitchen oxyCODONE-acetaminophen (PERCOCET/ROXICET) 5-325 MG tablet Take 1-2 tablets by mouth every 4 (four) hours as needed for moderate pain. (Patient not taking: Reported on 09/08/2017) 60 tablet 0  . zolpidem (AMBIEN) 10 MG tablet Take 5 mg by mouth at bedtime.     No current facility-administered medications on file prior to visit.     Allergies  Allergen Reactions  . Atorvastatin Other (See Comments)    MYALGIAS  . Rosuvastatin Other (See  Comments)    MYALGIAS    Objective:  General: Alert and oriented x3 in no acute distress  Dermatology: Small hyperkeratotic lesion overlying 5 PIPJ dorsally on left. No open lesions bilateral lower extremities, no webspace macerations, no ecchymosis bilateral, + dry skin, all nails x 10 are well manicured.  Vascular: Dorsalis Pedis 2/4 and Posterior Tibial pedal pulses 1/4, Capillary Fill Time 3 seconds,(+) pedal hair growth bilateral, no edema bilateral lower extremities, Temperature gradient within normal limits.  Neurology: Gross sensation intact via light touch bilateral. (- )Tinels sign right/left foot.   Musculoskeletal: Semi-flexible hammertoes 2-5 with Mild tenderness with palpation at PIPJ  Left 5th toe Ankle, residual digital and bunion deformity noted bilateral. No pain with calf compression bilateral.  Strength within normal limits in all groups bilateral.   Gait: Unassisted, Non-antalgic.  Xrays  Right/Left Foot    Impression:Normal osseous mineralization, hammertoe deformity, previous hardware intact, possible old fractures, no other acute issues.        Assessment and Plan: Problem List Items Addressed This Visit    None    Visit Diagnoses    Hammer toe, unspecified laterality    -  Primary   Relevant Orders   DG Foot Complete Right   DG Foot Complete Left   Dry skin       History of neuroma       Foot pain, bilateral           -Complete examination performed -Xrays reviewed -Discussed treatement options for dry skin, corn/hammertoe/residual deformities  -Recommend daily skin emollients for dry skin -Recommend toe cushion and advised patient that when she has time to consider further discussion of the pro/cons of revisional surgery -Continue with good supportive shoes -Patient to return to office as needed or sooner if condition worsens.  Landis Martins, DPM

## 2017-09-14 DIAGNOSIS — M5412 Radiculopathy, cervical region: Secondary | ICD-10-CM | POA: Diagnosis not present

## 2017-09-19 DIAGNOSIS — M50123 Cervical disc disorder at C6-C7 level with radiculopathy: Secondary | ICD-10-CM | POA: Diagnosis not present

## 2017-09-19 DIAGNOSIS — R202 Paresthesia of skin: Secondary | ICD-10-CM | POA: Diagnosis not present

## 2017-09-19 DIAGNOSIS — J0181 Other acute recurrent sinusitis: Secondary | ICD-10-CM | POA: Diagnosis not present

## 2017-09-28 DIAGNOSIS — M5416 Radiculopathy, lumbar region: Secondary | ICD-10-CM | POA: Diagnosis not present

## 2017-10-05 DIAGNOSIS — Z23 Encounter for immunization: Secondary | ICD-10-CM | POA: Diagnosis not present

## 2017-10-19 DIAGNOSIS — M50221 Other cervical disc displacement at C4-C5 level: Secondary | ICD-10-CM | POA: Diagnosis not present

## 2017-10-19 DIAGNOSIS — M50223 Other cervical disc displacement at C6-C7 level: Secondary | ICD-10-CM | POA: Diagnosis not present

## 2017-10-19 DIAGNOSIS — M50222 Other cervical disc displacement at C5-C6 level: Secondary | ICD-10-CM | POA: Diagnosis not present

## 2017-10-19 DIAGNOSIS — M48061 Spinal stenosis, lumbar region without neurogenic claudication: Secondary | ICD-10-CM | POA: Diagnosis not present

## 2017-11-09 DIAGNOSIS — M4802 Spinal stenosis, cervical region: Secondary | ICD-10-CM | POA: Diagnosis not present

## 2017-11-09 DIAGNOSIS — M4316 Spondylolisthesis, lumbar region: Secondary | ICD-10-CM | POA: Diagnosis not present

## 2017-11-22 DIAGNOSIS — M542 Cervicalgia: Secondary | ICD-10-CM | POA: Diagnosis not present

## 2017-11-22 DIAGNOSIS — M62551 Muscle wasting and atrophy, not elsewhere classified, right thigh: Secondary | ICD-10-CM | POA: Diagnosis not present

## 2017-11-22 DIAGNOSIS — M545 Low back pain: Secondary | ICD-10-CM | POA: Diagnosis not present

## 2017-11-24 DIAGNOSIS — M545 Low back pain: Secondary | ICD-10-CM | POA: Diagnosis not present

## 2017-11-24 DIAGNOSIS — M542 Cervicalgia: Secondary | ICD-10-CM | POA: Diagnosis not present

## 2017-11-24 DIAGNOSIS — M62551 Muscle wasting and atrophy, not elsewhere classified, right thigh: Secondary | ICD-10-CM | POA: Diagnosis not present

## 2017-11-28 DIAGNOSIS — M62551 Muscle wasting and atrophy, not elsewhere classified, right thigh: Secondary | ICD-10-CM | POA: Diagnosis not present

## 2017-11-28 DIAGNOSIS — M545 Low back pain: Secondary | ICD-10-CM | POA: Diagnosis not present

## 2017-11-28 DIAGNOSIS — M542 Cervicalgia: Secondary | ICD-10-CM | POA: Diagnosis not present

## 2017-12-01 DIAGNOSIS — M545 Low back pain: Secondary | ICD-10-CM | POA: Diagnosis not present

## 2017-12-01 DIAGNOSIS — M542 Cervicalgia: Secondary | ICD-10-CM | POA: Diagnosis not present

## 2017-12-01 DIAGNOSIS — M62551 Muscle wasting and atrophy, not elsewhere classified, right thigh: Secondary | ICD-10-CM | POA: Diagnosis not present

## 2017-12-06 DIAGNOSIS — M545 Low back pain: Secondary | ICD-10-CM | POA: Diagnosis not present

## 2017-12-06 DIAGNOSIS — M62551 Muscle wasting and atrophy, not elsewhere classified, right thigh: Secondary | ICD-10-CM | POA: Diagnosis not present

## 2017-12-06 DIAGNOSIS — M542 Cervicalgia: Secondary | ICD-10-CM | POA: Diagnosis not present

## 2017-12-09 DIAGNOSIS — M545 Low back pain: Secondary | ICD-10-CM | POA: Diagnosis not present

## 2017-12-09 DIAGNOSIS — M542 Cervicalgia: Secondary | ICD-10-CM | POA: Diagnosis not present

## 2017-12-09 DIAGNOSIS — M62551 Muscle wasting and atrophy, not elsewhere classified, right thigh: Secondary | ICD-10-CM | POA: Diagnosis not present

## 2017-12-16 DIAGNOSIS — M542 Cervicalgia: Secondary | ICD-10-CM | POA: Diagnosis not present

## 2017-12-16 DIAGNOSIS — M62551 Muscle wasting and atrophy, not elsewhere classified, right thigh: Secondary | ICD-10-CM | POA: Diagnosis not present

## 2017-12-16 DIAGNOSIS — M545 Low back pain: Secondary | ICD-10-CM | POA: Diagnosis not present

## 2017-12-29 DIAGNOSIS — M62551 Muscle wasting and atrophy, not elsewhere classified, right thigh: Secondary | ICD-10-CM | POA: Diagnosis not present

## 2017-12-29 DIAGNOSIS — M542 Cervicalgia: Secondary | ICD-10-CM | POA: Diagnosis not present

## 2017-12-29 DIAGNOSIS — M545 Low back pain: Secondary | ICD-10-CM | POA: Diagnosis not present

## 2018-01-02 DIAGNOSIS — M62551 Muscle wasting and atrophy, not elsewhere classified, right thigh: Secondary | ICD-10-CM | POA: Diagnosis not present

## 2018-01-02 DIAGNOSIS — M542 Cervicalgia: Secondary | ICD-10-CM | POA: Diagnosis not present

## 2018-01-02 DIAGNOSIS — M545 Low back pain: Secondary | ICD-10-CM | POA: Diagnosis not present

## 2018-01-05 DIAGNOSIS — M542 Cervicalgia: Secondary | ICD-10-CM | POA: Diagnosis not present

## 2018-01-05 DIAGNOSIS — M62551 Muscle wasting and atrophy, not elsewhere classified, right thigh: Secondary | ICD-10-CM | POA: Diagnosis not present

## 2018-01-05 DIAGNOSIS — M545 Low back pain: Secondary | ICD-10-CM | POA: Diagnosis not present

## 2018-01-11 DIAGNOSIS — M542 Cervicalgia: Secondary | ICD-10-CM | POA: Diagnosis not present

## 2018-01-11 DIAGNOSIS — M545 Low back pain: Secondary | ICD-10-CM | POA: Diagnosis not present

## 2018-01-11 DIAGNOSIS — M62551 Muscle wasting and atrophy, not elsewhere classified, right thigh: Secondary | ICD-10-CM | POA: Diagnosis not present

## 2018-01-13 DIAGNOSIS — M545 Low back pain: Secondary | ICD-10-CM | POA: Diagnosis not present

## 2018-01-13 DIAGNOSIS — M62551 Muscle wasting and atrophy, not elsewhere classified, right thigh: Secondary | ICD-10-CM | POA: Diagnosis not present

## 2018-01-13 DIAGNOSIS — M542 Cervicalgia: Secondary | ICD-10-CM | POA: Diagnosis not present

## 2018-01-16 DIAGNOSIS — M542 Cervicalgia: Secondary | ICD-10-CM | POA: Diagnosis not present

## 2018-01-16 DIAGNOSIS — M62551 Muscle wasting and atrophy, not elsewhere classified, right thigh: Secondary | ICD-10-CM | POA: Diagnosis not present

## 2018-01-16 DIAGNOSIS — M545 Low back pain: Secondary | ICD-10-CM | POA: Diagnosis not present

## 2018-01-18 DIAGNOSIS — M4802 Spinal stenosis, cervical region: Secondary | ICD-10-CM | POA: Diagnosis not present

## 2018-01-18 DIAGNOSIS — M4316 Spondylolisthesis, lumbar region: Secondary | ICD-10-CM | POA: Diagnosis not present

## 2018-01-19 DIAGNOSIS — M545 Low back pain: Secondary | ICD-10-CM | POA: Diagnosis not present

## 2018-01-19 DIAGNOSIS — M62551 Muscle wasting and atrophy, not elsewhere classified, right thigh: Secondary | ICD-10-CM | POA: Diagnosis not present

## 2018-01-19 DIAGNOSIS — M542 Cervicalgia: Secondary | ICD-10-CM | POA: Diagnosis not present

## 2018-01-24 DIAGNOSIS — M542 Cervicalgia: Secondary | ICD-10-CM | POA: Diagnosis not present

## 2018-01-24 DIAGNOSIS — M62551 Muscle wasting and atrophy, not elsewhere classified, right thigh: Secondary | ICD-10-CM | POA: Diagnosis not present

## 2018-01-24 DIAGNOSIS — M545 Low back pain: Secondary | ICD-10-CM | POA: Diagnosis not present

## 2018-01-27 DIAGNOSIS — M542 Cervicalgia: Secondary | ICD-10-CM | POA: Diagnosis not present

## 2018-01-27 DIAGNOSIS — M545 Low back pain: Secondary | ICD-10-CM | POA: Diagnosis not present

## 2018-01-27 DIAGNOSIS — M62551 Muscle wasting and atrophy, not elsewhere classified, right thigh: Secondary | ICD-10-CM | POA: Diagnosis not present

## 2018-01-30 DIAGNOSIS — M542 Cervicalgia: Secondary | ICD-10-CM | POA: Diagnosis not present

## 2018-01-30 DIAGNOSIS — M62551 Muscle wasting and atrophy, not elsewhere classified, right thigh: Secondary | ICD-10-CM | POA: Diagnosis not present

## 2018-01-30 DIAGNOSIS — M545 Low back pain: Secondary | ICD-10-CM | POA: Diagnosis not present

## 2018-01-31 DIAGNOSIS — L570 Actinic keratosis: Secondary | ICD-10-CM | POA: Diagnosis not present

## 2018-01-31 DIAGNOSIS — L820 Inflamed seborrheic keratosis: Secondary | ICD-10-CM | POA: Diagnosis not present

## 2018-01-31 DIAGNOSIS — R233 Spontaneous ecchymoses: Secondary | ICD-10-CM | POA: Diagnosis not present

## 2018-02-03 DIAGNOSIS — M545 Low back pain: Secondary | ICD-10-CM | POA: Diagnosis not present

## 2018-02-03 DIAGNOSIS — M542 Cervicalgia: Secondary | ICD-10-CM | POA: Diagnosis not present

## 2018-02-03 DIAGNOSIS — M62551 Muscle wasting and atrophy, not elsewhere classified, right thigh: Secondary | ICD-10-CM | POA: Diagnosis not present

## 2018-02-06 DIAGNOSIS — M545 Low back pain: Secondary | ICD-10-CM | POA: Diagnosis not present

## 2018-02-06 DIAGNOSIS — M542 Cervicalgia: Secondary | ICD-10-CM | POA: Diagnosis not present

## 2018-02-06 DIAGNOSIS — M62551 Muscle wasting and atrophy, not elsewhere classified, right thigh: Secondary | ICD-10-CM | POA: Diagnosis not present

## 2018-02-09 DIAGNOSIS — M62551 Muscle wasting and atrophy, not elsewhere classified, right thigh: Secondary | ICD-10-CM | POA: Diagnosis not present

## 2018-02-09 DIAGNOSIS — M542 Cervicalgia: Secondary | ICD-10-CM | POA: Diagnosis not present

## 2018-02-09 DIAGNOSIS — M545 Low back pain: Secondary | ICD-10-CM | POA: Diagnosis not present

## 2018-02-13 DIAGNOSIS — M62551 Muscle wasting and atrophy, not elsewhere classified, right thigh: Secondary | ICD-10-CM | POA: Diagnosis not present

## 2018-02-13 DIAGNOSIS — M545 Low back pain: Secondary | ICD-10-CM | POA: Diagnosis not present

## 2018-02-13 DIAGNOSIS — M542 Cervicalgia: Secondary | ICD-10-CM | POA: Diagnosis not present

## 2018-02-16 DIAGNOSIS — M542 Cervicalgia: Secondary | ICD-10-CM | POA: Diagnosis not present

## 2018-02-16 DIAGNOSIS — M545 Low back pain: Secondary | ICD-10-CM | POA: Diagnosis not present

## 2018-02-16 DIAGNOSIS — M62551 Muscle wasting and atrophy, not elsewhere classified, right thigh: Secondary | ICD-10-CM | POA: Diagnosis not present

## 2018-02-20 DIAGNOSIS — M542 Cervicalgia: Secondary | ICD-10-CM | POA: Diagnosis not present

## 2018-02-20 DIAGNOSIS — M62551 Muscle wasting and atrophy, not elsewhere classified, right thigh: Secondary | ICD-10-CM | POA: Diagnosis not present

## 2018-02-20 DIAGNOSIS — M545 Low back pain: Secondary | ICD-10-CM | POA: Diagnosis not present

## 2018-02-23 DIAGNOSIS — M545 Low back pain: Secondary | ICD-10-CM | POA: Diagnosis not present

## 2018-02-23 DIAGNOSIS — M542 Cervicalgia: Secondary | ICD-10-CM | POA: Diagnosis not present

## 2018-02-23 DIAGNOSIS — M62551 Muscle wasting and atrophy, not elsewhere classified, right thigh: Secondary | ICD-10-CM | POA: Diagnosis not present

## 2018-02-27 DIAGNOSIS — M542 Cervicalgia: Secondary | ICD-10-CM | POA: Diagnosis not present

## 2018-02-27 DIAGNOSIS — M545 Low back pain: Secondary | ICD-10-CM | POA: Diagnosis not present

## 2018-02-27 DIAGNOSIS — M62551 Muscle wasting and atrophy, not elsewhere classified, right thigh: Secondary | ICD-10-CM | POA: Diagnosis not present

## 2018-03-01 ENCOUNTER — Ambulatory Visit (INDEPENDENT_AMBULATORY_CARE_PROVIDER_SITE_OTHER): Payer: Medicare Other | Admitting: Sports Medicine

## 2018-03-01 ENCOUNTER — Encounter: Payer: Self-pay | Admitting: Sports Medicine

## 2018-03-01 DIAGNOSIS — M722 Plantar fascial fibromatosis: Secondary | ICD-10-CM | POA: Diagnosis not present

## 2018-03-01 DIAGNOSIS — M79672 Pain in left foot: Secondary | ICD-10-CM | POA: Diagnosis not present

## 2018-03-01 DIAGNOSIS — M779 Enthesopathy, unspecified: Secondary | ICD-10-CM | POA: Diagnosis not present

## 2018-03-01 DIAGNOSIS — M19072 Primary osteoarthritis, left ankle and foot: Secondary | ICD-10-CM

## 2018-03-01 MED ORDER — MELOXICAM 15 MG PO TABS: 15.0000 mg | ORAL_TABLET | Freq: Every day | ORAL | 0 refills | Status: DC

## 2018-03-01 MED ORDER — METHYLPREDNISOLONE 4 MG PO TBPK: ORAL_TABLET | ORAL | 0 refills | Status: DC

## 2018-03-01 NOTE — Progress Notes (Signed)
Subjective: Meghan Tucker is a 72 y.o. female patient who presents to office for follow-up evaluation of left foot pain.  Patient reports that pain has worsened since last visit with now pain along the left arch and along the side of the left foot patient reports that pain is most intense with first few steps out of bed in the morning then seems like it loosens up and gets a little bit better however the more that she is on the go and on her feet throughout the day seems like the pain recurs and comes back.  Patient states that she is using a corn pad for her left fifth toe that gives her little relief wants the thick corn layers peel-off however denies any other symptoms at this time.   Patient Active Problem List   Diagnosis Date Noted  . Spondylolisthesis of lumbar region 10/26/2016  . Nausea alone 10/12/2013  . GERD (gastroesophageal reflux disease) 10/12/2013    Current Outpatient Medications on File Prior to Visit  Medication Sig Dispense Refill  . AZO-CRANBERRY PO Take 1 tablet by mouth daily.    . Biotin (BIOTIN 5000) 5 MG CAPS Take 5 mg by mouth daily.    Marland Kitchen denosumab (PROLIA) 60 MG/ML SOLN injection Inject 60 mg into the skin every 6 (six) months. Twice a year    . diazepam (VALIUM) 5 MG tablet Take 1 tablet (5 mg total) by mouth every 6 (six) hours as needed for anxiety or muscle spasms. 60 tablet 0  . ergocalciferol (VITAMIN D2) 50000 units capsule Take 50,000 Units by mouth every Friday.    . Ginkgo Biloba 60 MG CAPS Take 60 mg by mouth every evening.    . metoprolol succinate (TOPROL-XL) 25 MG 24 hr tablet Take 25 mg by mouth daily.    . mometasone (NASONEX) 50 MCG/ACT nasal spray Place 2 sprays into the nose daily as needed (for nasal congestion).    . Multiple Minerals-Vitamins (GNP CAL MAG ZINC +D3) TABS Take 1 tablet by mouth every 36 (thirty-six) hours.    . Omega-3 Fatty Acids (FISH OIL) 1000 MG CAPS Take 1,000 mg by mouth daily.     Marland Kitchen omeprazole (PRILOSEC OTC) 20 MG tablet  Take 20 mg by mouth every other day.    Marland Kitchen OVER THE COUNTER MEDICATION Place 2 drops under the tongue daily. HEMP DROPS    . oxyCODONE-acetaminophen (PERCOCET/ROXICET) 5-325 MG tablet Take 0.5 tablets by mouth 2 (two) times daily.    Marland Kitchen oxyCODONE-acetaminophen (PERCOCET/ROXICET) 5-325 MG tablet Take 1-2 tablets by mouth every 4 (four) hours as needed for moderate pain. (Patient not taking: Reported on 09/08/2017) 60 tablet 0  . Polyethyl Glycol-Propyl Glycol (SYSTANE) 0.4-0.3 % SOLN Place 1 drop into both eyes 3 (three) times daily as needed (for dry eyes).    . potassium chloride (K-DUR,KLOR-CON) 10 MEQ tablet Take 10 mEq by mouth daily.    . ranitidine (ZANTAC) 300 MG capsule Take 300 mg by mouth.    . temazepam (RESTORIL) 15 MG capsule Take 15 mg by mouth.    . zolpidem (AMBIEN) 10 MG tablet Take 5 mg by mouth at bedtime.     No current facility-administered medications on file prior to visit.     Allergies  Allergen Reactions  . Atorvastatin Other (See Comments)    MYALGIAS  . Rosuvastatin Other (See Comments)    MYALGIAS    Objective:  General: Alert and oriented x3 in no acute distress  Dermatology: Small hyperkeratotic lesion overlying  5 PIPJ dorsally on left. No open lesions bilateral lower extremities, no webspace macerations, no ecchymosis bilateral, + dry skin, all nails x 10 are well manicured.  Vascular: Dorsalis Pedis 2/4 and Posterior Tibial pedal pulses 1/4, Capillary Fill Time 3 seconds,(+) pedal hair growth bilateral, no edema bilateral lower extremities, Temperature gradient within normal limits.  Neurology: Gross sensation intact via light touch bilateral. (- )Tinels sign left foot.  Musculoskeletal: There is mild tenderness along the plantar arch and plantar fascia on the left foot and along the peroneal tendon on the left.  There is dorsal midfoot bone spur significant for arthritis, semi-flexible hammertoes 2-5 with Mild tenderness with palpation at PIPJ Left 5th  toe, ankle, residual digital and bunion deformity noted bilateral. No pain with calf compression bilateral.  Strength within normal limits in all groups bilateral.   Gait: Unassisted, Non-antalgic.  Assessment and Plan: Problem List Items Addressed This Visit    None    Visit Diagnoses    Plantar fasciitis    -  Primary   Relevant Medications   methylPREDNISolone (MEDROL DOSEPAK) 4 MG TBPK tablet   meloxicam (MOBIC) 15 MG tablet   Arthritis of left foot       Relevant Medications   methylPREDNISolone (MEDROL DOSEPAK) 4 MG TBPK tablet   meloxicam (MOBIC) 15 MG tablet   Tendonitis       Relevant Medications   methylPREDNISolone (MEDROL DOSEPAK) 4 MG TBPK tablet   meloxicam (MOBIC) 15 MG tablet   Left foot pain       Relevant Medications   methylPREDNISolone (MEDROL DOSEPAK) 4 MG TBPK tablet   meloxicam (MOBIC) 15 MG tablet       -Complete examination performed -Previous xrays reviewed -Discussed treatement options for likely continued pain in the foot secondary to arthritis and now overuse plantar fasciitis and tendinitis -Prescribed Medrol Dosepak to take as instructed followed by meloxicam -Recommend daily stretching -Recommend daily icing with frozen water bottle as instructed -Continue with good supportive shoes -Continue with corn padding left fifth toe -Patient to return to office in 1 month or as needed or sooner if condition worsens.  Advised patient if pain continues may benefit from topical pain cream and physical therapy.  Landis Martins, DPM

## 2018-03-01 NOTE — Patient Instructions (Signed)

## 2018-03-09 ENCOUNTER — Telehealth: Payer: Self-pay | Admitting: Sports Medicine

## 2018-03-09 NOTE — Telephone Encounter (Signed)
She can try OTC topical pain cream like Aspercreme to the area. It is too soon to refill the steroid but she should keep taking her Mobic and plan to follow up in office on next week if pain continues

## 2018-03-09 NOTE — Telephone Encounter (Signed)
Dr. Stover please advise 

## 2018-03-09 NOTE — Telephone Encounter (Signed)
Pt called in stated she has finished taking her meds and is in a lot of pain started yesterday. Wants to know what she needs to do?

## 2018-03-10 NOTE — Telephone Encounter (Signed)
Patient stopped by office on 03/09/18 and was given information in person

## 2018-03-17 DIAGNOSIS — M62551 Muscle wasting and atrophy, not elsewhere classified, right thigh: Secondary | ICD-10-CM | POA: Diagnosis not present

## 2018-03-17 DIAGNOSIS — M542 Cervicalgia: Secondary | ICD-10-CM | POA: Diagnosis not present

## 2018-03-17 DIAGNOSIS — M545 Low back pain: Secondary | ICD-10-CM | POA: Diagnosis not present

## 2018-03-20 DIAGNOSIS — H9313 Tinnitus, bilateral: Secondary | ICD-10-CM | POA: Diagnosis not present

## 2018-03-20 DIAGNOSIS — H903 Sensorineural hearing loss, bilateral: Secondary | ICD-10-CM | POA: Diagnosis not present

## 2018-03-22 DIAGNOSIS — M542 Cervicalgia: Secondary | ICD-10-CM | POA: Diagnosis not present

## 2018-03-22 DIAGNOSIS — M62551 Muscle wasting and atrophy, not elsewhere classified, right thigh: Secondary | ICD-10-CM | POA: Diagnosis not present

## 2018-03-22 DIAGNOSIS — M545 Low back pain: Secondary | ICD-10-CM | POA: Diagnosis not present

## 2018-03-27 DIAGNOSIS — M545 Low back pain: Secondary | ICD-10-CM | POA: Diagnosis not present

## 2018-03-27 DIAGNOSIS — M542 Cervicalgia: Secondary | ICD-10-CM | POA: Diagnosis not present

## 2018-03-27 DIAGNOSIS — M62551 Muscle wasting and atrophy, not elsewhere classified, right thigh: Secondary | ICD-10-CM | POA: Diagnosis not present

## 2018-03-29 DIAGNOSIS — M62551 Muscle wasting and atrophy, not elsewhere classified, right thigh: Secondary | ICD-10-CM | POA: Diagnosis not present

## 2018-03-29 DIAGNOSIS — M542 Cervicalgia: Secondary | ICD-10-CM | POA: Diagnosis not present

## 2018-03-29 DIAGNOSIS — M545 Low back pain: Secondary | ICD-10-CM | POA: Diagnosis not present

## 2018-03-29 DIAGNOSIS — M79673 Pain in unspecified foot: Secondary | ICD-10-CM

## 2018-04-04 DIAGNOSIS — H02831 Dermatochalasis of right upper eyelid: Secondary | ICD-10-CM | POA: Insufficient documentation

## 2018-04-04 DIAGNOSIS — H02834 Dermatochalasis of left upper eyelid: Secondary | ICD-10-CM | POA: Insufficient documentation

## 2018-04-06 DIAGNOSIS — L710 Perioral dermatitis: Secondary | ICD-10-CM | POA: Diagnosis not present

## 2018-04-10 DIAGNOSIS — H02834 Dermatochalasis of left upper eyelid: Secondary | ICD-10-CM | POA: Diagnosis not present

## 2018-04-10 DIAGNOSIS — H02831 Dermatochalasis of right upper eyelid: Secondary | ICD-10-CM | POA: Diagnosis not present

## 2018-04-12 DIAGNOSIS — M542 Cervicalgia: Secondary | ICD-10-CM | POA: Diagnosis not present

## 2018-04-12 DIAGNOSIS — M62551 Muscle wasting and atrophy, not elsewhere classified, right thigh: Secondary | ICD-10-CM | POA: Diagnosis not present

## 2018-04-12 DIAGNOSIS — M545 Low back pain: Secondary | ICD-10-CM | POA: Diagnosis not present

## 2018-04-19 DIAGNOSIS — E7800 Pure hypercholesterolemia, unspecified: Secondary | ICD-10-CM | POA: Diagnosis not present

## 2018-04-19 DIAGNOSIS — M659 Synovitis and tenosynovitis, unspecified: Secondary | ICD-10-CM | POA: Diagnosis not present

## 2018-04-19 DIAGNOSIS — M159 Polyosteoarthritis, unspecified: Secondary | ICD-10-CM | POA: Diagnosis not present

## 2018-04-19 DIAGNOSIS — J309 Allergic rhinitis, unspecified: Secondary | ICD-10-CM | POA: Diagnosis not present

## 2018-05-01 DIAGNOSIS — E782 Mixed hyperlipidemia: Secondary | ICD-10-CM | POA: Diagnosis not present

## 2018-05-01 DIAGNOSIS — N958 Other specified menopausal and perimenopausal disorders: Secondary | ICD-10-CM | POA: Diagnosis not present

## 2018-05-01 DIAGNOSIS — R194 Change in bowel habit: Secondary | ICD-10-CM | POA: Diagnosis not present

## 2018-05-01 DIAGNOSIS — R233 Spontaneous ecchymoses: Secondary | ICD-10-CM | POA: Diagnosis not present

## 2018-05-01 DIAGNOSIS — M21371 Foot drop, right foot: Secondary | ICD-10-CM | POA: Diagnosis not present

## 2018-05-01 DIAGNOSIS — R142 Eructation: Secondary | ICD-10-CM | POA: Diagnosis not present

## 2018-05-01 DIAGNOSIS — R202 Paresthesia of skin: Secondary | ICD-10-CM | POA: Diagnosis not present

## 2018-05-04 DIAGNOSIS — Z1231 Encounter for screening mammogram for malignant neoplasm of breast: Secondary | ICD-10-CM | POA: Diagnosis not present

## 2018-05-08 DIAGNOSIS — M545 Low back pain: Secondary | ICD-10-CM | POA: Diagnosis not present

## 2018-05-08 DIAGNOSIS — H02831 Dermatochalasis of right upper eyelid: Secondary | ICD-10-CM | POA: Diagnosis not present

## 2018-05-08 DIAGNOSIS — M542 Cervicalgia: Secondary | ICD-10-CM | POA: Diagnosis not present

## 2018-05-08 DIAGNOSIS — M62551 Muscle wasting and atrophy, not elsewhere classified, right thigh: Secondary | ICD-10-CM | POA: Diagnosis not present

## 2018-05-08 DIAGNOSIS — H02834 Dermatochalasis of left upper eyelid: Secondary | ICD-10-CM | POA: Diagnosis not present

## 2018-05-18 DIAGNOSIS — M62551 Muscle wasting and atrophy, not elsewhere classified, right thigh: Secondary | ICD-10-CM | POA: Diagnosis not present

## 2018-05-18 DIAGNOSIS — M545 Low back pain: Secondary | ICD-10-CM | POA: Diagnosis not present

## 2018-05-18 DIAGNOSIS — M542 Cervicalgia: Secondary | ICD-10-CM | POA: Diagnosis not present

## 2018-05-26 DIAGNOSIS — M62551 Muscle wasting and atrophy, not elsewhere classified, right thigh: Secondary | ICD-10-CM | POA: Diagnosis not present

## 2018-05-26 DIAGNOSIS — M545 Low back pain: Secondary | ICD-10-CM | POA: Diagnosis not present

## 2018-05-26 DIAGNOSIS — M542 Cervicalgia: Secondary | ICD-10-CM | POA: Diagnosis not present

## 2018-06-01 DIAGNOSIS — M545 Low back pain: Secondary | ICD-10-CM | POA: Diagnosis not present

## 2018-06-01 DIAGNOSIS — M542 Cervicalgia: Secondary | ICD-10-CM | POA: Diagnosis not present

## 2018-06-01 DIAGNOSIS — M62551 Muscle wasting and atrophy, not elsewhere classified, right thigh: Secondary | ICD-10-CM | POA: Diagnosis not present

## 2018-06-02 DIAGNOSIS — R202 Paresthesia of skin: Secondary | ICD-10-CM | POA: Diagnosis not present

## 2018-06-02 DIAGNOSIS — M12371 Palindromic rheumatism, right ankle and foot: Secondary | ICD-10-CM | POA: Diagnosis not present

## 2018-06-05 DIAGNOSIS — F419 Anxiety disorder, unspecified: Secondary | ICD-10-CM | POA: Diagnosis not present

## 2018-06-05 DIAGNOSIS — I10 Essential (primary) hypertension: Secondary | ICD-10-CM | POA: Diagnosis not present

## 2018-06-05 DIAGNOSIS — H02831 Dermatochalasis of right upper eyelid: Secondary | ICD-10-CM | POA: Diagnosis not present

## 2018-06-05 DIAGNOSIS — H02834 Dermatochalasis of left upper eyelid: Secondary | ICD-10-CM | POA: Diagnosis not present

## 2018-06-15 DIAGNOSIS — M7918 Myalgia, other site: Secondary | ICD-10-CM | POA: Diagnosis not present

## 2018-06-15 DIAGNOSIS — M546 Pain in thoracic spine: Secondary | ICD-10-CM | POA: Diagnosis not present

## 2018-06-19 DIAGNOSIS — M7918 Myalgia, other site: Secondary | ICD-10-CM | POA: Diagnosis not present

## 2018-06-19 DIAGNOSIS — M546 Pain in thoracic spine: Secondary | ICD-10-CM | POA: Diagnosis not present

## 2018-07-11 DIAGNOSIS — H0100A Unspecified blepharitis right eye, upper and lower eyelids: Secondary | ICD-10-CM | POA: Insufficient documentation

## 2018-07-11 DIAGNOSIS — H0100B Unspecified blepharitis left eye, upper and lower eyelids: Secondary | ICD-10-CM | POA: Insufficient documentation

## 2018-07-13 DIAGNOSIS — L710 Perioral dermatitis: Secondary | ICD-10-CM | POA: Diagnosis not present

## 2018-08-16 DIAGNOSIS — K582 Mixed irritable bowel syndrome: Secondary | ICD-10-CM | POA: Diagnosis not present

## 2018-08-16 DIAGNOSIS — M542 Cervicalgia: Secondary | ICD-10-CM | POA: Diagnosis not present

## 2018-08-16 DIAGNOSIS — E559 Vitamin D deficiency, unspecified: Secondary | ICD-10-CM | POA: Diagnosis not present

## 2018-08-16 DIAGNOSIS — E782 Mixed hyperlipidemia: Secondary | ICD-10-CM | POA: Diagnosis not present

## 2018-08-16 DIAGNOSIS — F5101 Primary insomnia: Secondary | ICD-10-CM | POA: Diagnosis not present

## 2018-08-16 DIAGNOSIS — F320 Major depressive disorder, single episode, mild: Secondary | ICD-10-CM | POA: Diagnosis not present

## 2018-08-16 DIAGNOSIS — I10 Essential (primary) hypertension: Secondary | ICD-10-CM | POA: Diagnosis not present

## 2018-09-14 DIAGNOSIS — Z01411 Encounter for gynecological examination (general) (routine) with abnormal findings: Secondary | ICD-10-CM | POA: Diagnosis not present

## 2018-09-14 DIAGNOSIS — Z1151 Encounter for screening for human papillomavirus (HPV): Secondary | ICD-10-CM | POA: Diagnosis not present

## 2018-09-14 DIAGNOSIS — N952 Postmenopausal atrophic vaginitis: Secondary | ICD-10-CM | POA: Diagnosis not present

## 2018-09-14 DIAGNOSIS — Z01419 Encounter for gynecological examination (general) (routine) without abnormal findings: Secondary | ICD-10-CM | POA: Diagnosis not present

## 2018-09-18 DIAGNOSIS — L988 Other specified disorders of the skin and subcutaneous tissue: Secondary | ICD-10-CM | POA: Diagnosis not present

## 2018-09-18 DIAGNOSIS — M810 Age-related osteoporosis without current pathological fracture: Secondary | ICD-10-CM | POA: Diagnosis not present

## 2018-09-27 ENCOUNTER — Telehealth: Payer: Self-pay | Admitting: *Deleted

## 2018-09-27 ENCOUNTER — Ambulatory Visit (INDEPENDENT_AMBULATORY_CARE_PROVIDER_SITE_OTHER): Payer: Medicare Other | Admitting: Sports Medicine

## 2018-09-27 ENCOUNTER — Ambulatory Visit (INDEPENDENT_AMBULATORY_CARE_PROVIDER_SITE_OTHER): Payer: Medicare Other

## 2018-09-27 DIAGNOSIS — M779 Enthesopathy, unspecified: Secondary | ICD-10-CM | POA: Diagnosis not present

## 2018-09-27 DIAGNOSIS — M79672 Pain in left foot: Secondary | ICD-10-CM

## 2018-09-27 DIAGNOSIS — M19072 Primary osteoarthritis, left ankle and foot: Secondary | ICD-10-CM

## 2018-09-27 DIAGNOSIS — M2040 Other hammer toe(s) (acquired), unspecified foot: Secondary | ICD-10-CM

## 2018-09-27 NOTE — Telephone Encounter (Signed)
Left message on pt's home and mobile phone informing pt of [facility] [phone] appt.

## 2018-09-27 NOTE — Telephone Encounter (Signed)
[facility] Kathrin Ruddy scheduled MRI [id number] on 09/29/2018 4:45pm arrival, for 5:00pm test. Faxed to [place].

## 2018-09-27 NOTE — Telephone Encounter (Signed)
-----   Message from [place], [place] sent at 09/27/2018  2:13 PM EDT ----- Regarding: MRI L foot Midfoot pain and pain to lateral ankle since March with no improvement

## 2018-09-27 NOTE — Progress Notes (Signed)
Subjective: Meghan Tucker is a 72 y.o. female patient who presents to office for follow-up evaluation of left foot pain.  Patient reports that pain has worsened and has not went away since last visit.  Patient states that pain is still across the top and the side of her left foot and states that she has significant pain over the corn at the fifth toe states that she is ready to have something more aggressively done.  Patient admits to swelling over the foot and ankle and reports that the more that she is on her foot the more painful it gets sometimes it can vary sometimes it can be as little as 3 out of 10 or 10 out of 10 depending on how much walking or standing she has been doing states that sometimes even she gets a burning sensation especially at the ankle that involves her whole entire foot.  Patient denies any other symptoms at this time.   Patient Active Problem List   Diagnosis Date Noted  . Spondylolisthesis of lumbar region 10/26/2016  . Nausea alone 10/12/2013  . GERD (gastroesophageal reflux disease) 10/12/2013    Current Outpatient Medications on File Prior to Visit  Medication Sig Dispense Refill  . AZO-CRANBERRY PO Take 1 tablet by mouth daily.    . Biotin (BIOTIN 5000) 5 MG CAPS Take 5 mg by mouth daily.    Marland Kitchen denosumab (PROLIA) 60 MG/ML SOLN injection Inject 60 mg into the skin every 6 (six) months. Twice a year    . diazepam (VALIUM) 5 MG tablet Take 1 tablet (5 mg total) by mouth every 6 (six) hours as needed for anxiety or muscle spasms. 60 tablet 0  . ergocalciferol (VITAMIN D2) 50000 units capsule Take 50,000 Units by mouth every Friday.    . Ginkgo Biloba 60 MG CAPS Take 60 mg by mouth every evening.    . meloxicam (MOBIC) 15 MG tablet Take 1 tablet (15 mg total) by mouth daily. 30 tablet 0  . methylPREDNISolone (MEDROL DOSEPAK) 4 MG TBPK tablet Take 1st as instructed 21 tablet 0  . metoprolol succinate (TOPROL-XL) 25 MG 24 hr tablet Take 25 mg by mouth daily.    .  mometasone (NASONEX) 50 MCG/ACT nasal spray Place 2 sprays into the nose daily as needed (for nasal congestion).    . Multiple Minerals-Vitamins (GNP CAL MAG ZINC +D3) TABS Take 1 tablet by mouth every 36 (thirty-six) hours.    . Omega-3 Fatty Acids (FISH OIL) 1000 MG CAPS Take 1,000 mg by mouth daily.     Marland Kitchen omeprazole (PRILOSEC OTC) 20 MG tablet Take 20 mg by mouth every other day.    Marland Kitchen OVER THE COUNTER MEDICATION Place 2 drops under the tongue daily. HEMP DROPS    . oxyCODONE-acetaminophen (PERCOCET/ROXICET) 5-325 MG tablet Take 0.5 tablets by mouth 2 (two) times daily.    Marland Kitchen oxyCODONE-acetaminophen (PERCOCET/ROXICET) 5-325 MG tablet Take 1-2 tablets by mouth every 4 (four) hours as needed for moderate pain. 60 tablet 0  . Polyethyl Glycol-Propyl Glycol (SYSTANE) 0.4-0.3 % SOLN Place 1 drop into both eyes 3 (three) times daily as needed (for dry eyes).    . potassium chloride (K-DUR,KLOR-CON) 10 MEQ tablet Take 10 mEq by mouth daily.    . ranitidine (ZANTAC) 300 MG capsule Take 300 mg by mouth.    . temazepam (RESTORIL) 15 MG capsule Take 15 mg by mouth.    . zolpidem (AMBIEN) 10 MG tablet Take 5 mg by mouth at bedtime.  No current facility-administered medications on file prior to visit.     Allergies  Allergen Reactions  . Atorvastatin Other (See Comments)    MYALGIAS MYALGIAS  . Rosuvastatin Other (See Comments)    MYALGIAS MYALGIAS    Objective:  General: Alert and oriented x3 in no acute distress  Dermatology: Small hyperkeratotic lesion overlying 5 PIPJ dorsally on left. No open lesions bilateral lower extremities, no webspace macerations, no ecchymosis bilateral, + dry skin, all nails x 10 are well manicured.  Vascular: Dorsalis Pedis 2/4 and Posterior Tibial pedal pulses 1/4, Capillary Fill Time 3 seconds,(+) pedal hair growth bilateral, no edema bilateral lower extremities, Temperature gradient within normal limits.  Neurology: Gross sensation intact via light touch  bilateral. (- )Tinels sign left foot.  Musculoskeletal: There is mild tenderness along the plantar arch and plantar fascia on the left foot and along the peroneal tendon on the left.  There is dorsal midfoot bone spur significant for arthritis and pain with palpation on the left, semi-flexible hammertoes 2-5 with Mild tenderness with palpation at PIPJ Left 5th toe, ankle, residual digital and bunion deformity noted bilateral. No pain with calf compression bilateral.  Strength within normal limits in all groups bilateral.   Gait: Unassisted, Non-antalgic.  X-rays reveal diffuse arthritis no other acute findings.  Assessment and Plan: Problem List Items Addressed This Visit    None    Visit Diagnoses    Arthritis of left foot    -  Primary   Relevant Orders   DG Foot Complete Left   Tendonitis       Relevant Orders   DG Foot Complete Left   Left foot pain       Relevant Orders   DG Foot Complete Left   Hammer toe, unspecified laterality       Relevant Orders   DG Foot Complete Left       -Complete examination performed -Xrays reviewed -Discussed treatement options for likely continued pain in the foot secondary to arthritis with continued plantar fasciitis and tendinitis -Ordered MRI for further evaluation for possible surgical planning -Advised patient to continue with over-the-counter medications for arthritis -Recommend daily stretching -Recommend daily icing with frozen water bottle as instructed previously to decrease recurrence of fasciitis symptoms -Advised patient to return to using short cam boot until after MRI -Patient to return to office after MRI or sooner if problems or issues arise. Landis Martins, DPM

## 2018-09-29 DIAGNOSIS — M779 Enthesopathy, unspecified: Secondary | ICD-10-CM | POA: Diagnosis not present

## 2018-09-29 DIAGNOSIS — M19072 Primary osteoarthritis, left ankle and foot: Secondary | ICD-10-CM | POA: Diagnosis not present

## 2018-10-02 ENCOUNTER — Encounter: Payer: Self-pay | Admitting: Sports Medicine

## 2018-10-02 DIAGNOSIS — Z23 Encounter for immunization: Secondary | ICD-10-CM | POA: Diagnosis not present

## 2018-10-03 ENCOUNTER — Telehealth: Payer: Self-pay | Admitting: *Deleted

## 2018-10-03 DIAGNOSIS — N3000 Acute cystitis without hematuria: Secondary | ICD-10-CM | POA: Diagnosis not present

## 2018-10-03 DIAGNOSIS — R600 Localized edema: Secondary | ICD-10-CM | POA: Diagnosis not present

## 2018-10-03 NOTE — Telephone Encounter (Signed)
Left message informing pt Dr. Cannon Kettle had reviewed the MRI results and to call to discuss the options, or if she would like to discuss with Dr. Cannon Kettle in greater detail make an appt.

## 2018-10-03 NOTE — Telephone Encounter (Signed)
-----   Message from Landis Martins, [place] sent at 10/03/2018  7:15 AM EDT ----- Regarding: MRI Results Her MRI shows arthritis. Her options at this point include considering injection of steroid, Physical therapy, or consult to rheumatologist for recommendations on treating her arthritis or surgery. If patient would like to come in to talk about these options have her make an appointment to be seen within 2 weeks Thanks -Dr. Cannon Kettle

## 2018-10-04 NOTE — Telephone Encounter (Deleted)
-----   Message from Landis Martins, [place] sent at 10/03/2018  7:15 AM EDT ----- Regarding: MRI Results Her MRI shows arthritis. Her options at this point include considering injection of steroid, Physical therapy, or consult to rheumatologist for recommendations on treating her arthritis or surgery. If patient would like to come in to talk about these options have her make an appointment to be seen within 2 weeks Thanks -Dr. Cannon Kettle

## 2018-10-04 NOTE — Telephone Encounter (Signed)
Pt called for results. I left message informing pt I would call again tomorrow.

## 2018-10-05 NOTE — Telephone Encounter (Addendum)
Meghan Tucker called for results. I called Meghan Tucker and informed of Dr. Leeanne Rio review of MRI and recommendations. Meghan Tucker states she would like to start with the injection and hopefully that will take care of everything. Transferred Meghan Tucker to schedulers.

## 2018-10-16 DIAGNOSIS — J060 Acute laryngopharyngitis: Secondary | ICD-10-CM | POA: Diagnosis not present

## 2018-10-18 ENCOUNTER — Ambulatory Visit (INDEPENDENT_AMBULATORY_CARE_PROVIDER_SITE_OTHER): Payer: Medicare Other | Admitting: Sports Medicine

## 2018-10-18 ENCOUNTER — Encounter: Payer: Self-pay | Admitting: Sports Medicine

## 2018-10-18 DIAGNOSIS — M19072 Primary osteoarthritis, left ankle and foot: Secondary | ICD-10-CM | POA: Diagnosis not present

## 2018-10-18 DIAGNOSIS — M79672 Pain in left foot: Secondary | ICD-10-CM

## 2018-10-18 DIAGNOSIS — M779 Enthesopathy, unspecified: Secondary | ICD-10-CM

## 2018-10-18 NOTE — Progress Notes (Signed)
Subjective: Meghan Tucker is a 72 y.o. female patient who presents to office for follow-up evaluation of left foot pain.  Patient reports that pain is better as long as she is at the feet and is able to walk on comfortable surfaces like sand.  Patient reports that she has also tried reflexology which seems to help and is here today because she wants to discuss the results of her MRI and also to discuss the possibility of treating her arthritis with a shot.  Patient denies any other symptoms at this time.   Patient Active Problem List   Diagnosis Date Noted  . Spondylolisthesis of lumbar region 10/26/2016  . Nausea alone 10/12/2013  . GERD (gastroesophageal reflux disease) 10/12/2013    Current Outpatient Medications on File Prior to Visit  Medication Sig Dispense Refill  . AZO-CRANBERRY PO Take 1 tablet by mouth daily.    . Biotin (BIOTIN 5000) 5 MG CAPS Take 5 mg by mouth daily.    Marland Kitchen denosumab (PROLIA) 60 MG/ML SOLN injection Inject 60 mg into the skin every 6 (six) months. Twice a year    . diazepam (VALIUM) 5 MG tablet Take 1 tablet (5 mg total) by mouth every 6 (six) hours as needed for anxiety or muscle spasms. 60 tablet 0  . ergocalciferol (VITAMIN D2) 50000 units capsule Take 50,000 Units by mouth every Friday.    . Ginkgo Biloba 60 MG CAPS Take 60 mg by mouth every evening.    . meloxicam (MOBIC) 15 MG tablet Take 1 tablet (15 mg total) by mouth daily. 30 tablet 0  . methylPREDNISolone (MEDROL DOSEPAK) 4 MG TBPK tablet Take 1st as instructed 21 tablet 0  . metoprolol succinate (TOPROL-XL) 25 MG 24 hr tablet Take 25 mg by mouth daily.    . mometasone (NASONEX) 50 MCG/ACT nasal spray Place 2 sprays into the nose daily as needed (for nasal congestion).    . Multiple Minerals-Vitamins (GNP CAL MAG ZINC +D3) TABS Take 1 tablet by mouth every 36 (thirty-six) hours.    . Omega-3 Fatty Acids (FISH OIL) 1000 MG CAPS Take 1,000 mg by mouth daily.     Marland Kitchen omeprazole (PRILOSEC OTC) 20 MG tablet  Take 20 mg by mouth every other day.    Marland Kitchen OVER THE COUNTER MEDICATION Place 2 drops under the tongue daily. HEMP DROPS    . oxyCODONE-acetaminophen (PERCOCET/ROXICET) 5-325 MG tablet Take 0.5 tablets by mouth 2 (two) times daily.    Marland Kitchen oxyCODONE-acetaminophen (PERCOCET/ROXICET) 5-325 MG tablet Take 1-2 tablets by mouth every 4 (four) hours as needed for moderate pain. 60 tablet 0  . Polyethyl Glycol-Propyl Glycol (SYSTANE) 0.4-0.3 % SOLN Place 1 drop into both eyes 3 (three) times daily as needed (for dry eyes).    . potassium chloride (K-DUR,KLOR-CON) 10 MEQ tablet Take 10 mEq by mouth daily.    . ranitidine (ZANTAC) 300 MG capsule Take 300 mg by mouth.    . temazepam (RESTORIL) 15 MG capsule Take 15 mg by mouth.    . zolpidem (AMBIEN) 10 MG tablet Take 5 mg by mouth at bedtime.     No current facility-administered medications on file prior to visit.     Allergies  Allergen Reactions  . Atorvastatin Other (See Comments)    MYALGIAS MYALGIAS  . Rosuvastatin Other (See Comments)    MYALGIAS MYALGIAS    Objective:  General: Alert and oriented x3 in no acute distress  Dermatology: Small hyperkeratotic lesion overlying 5 PIPJ dorsally on left. No open  lesions bilateral lower extremities, no webspace macerations, no ecchymosis bilateral, + dry skin, all nails x 10 are well manicured.  Vascular: Dorsalis Pedis 2/4 and Posterior Tibial pedal pulses 1/4, Capillary Fill Time 3 seconds,(+) pedal hair growth bilateral, no edema bilateral lower extremities, Temperature gradient within normal limits.  Neurology: Gross sensation intact via light touch bilateral. (- )Tinels sign left foot.  Musculoskeletal: There is no tenderness along the plantar arch and plantar fascia on the left foot and along the peroneal tendon on the left.  There is dorsal midfoot bone spur significant for arthritis and pain with palpation on the left especially at the dorsal lateral aspect of the foot, semi-flexible  hammertoes 2-5 with Mild tenderness with palpation at PIPJ Left 5th toe, ankle, residual digital and bunion deformity noted bilateral. No pain with calf compression bilateral.  Strength within normal limits in all groups bilateral.   Assessment and Plan: Problem List Items Addressed This Visit    None    Visit Diagnoses    Capsulitis    -  Primary   Arthritis of left foot       Tendonitis       Left foot pain           -Complete examination performed -MRI results reviewed -Discussed treatement options for arthritis -After oral consent and aseptic prep, injected a mixture containing 1 ml of 2%  plain lidocaine, 1 ml 0.5% plain marcaine, 0.5 ml of kenalog 10 and 0.5 ml of dexamethasone phosphate into left dorsal lateral midfoot without complication. Post-injection care discussed with patient.  -Advised patient to continue with over-the-counter medications for arthritis -Recommend daily stretching -Recommend daily icing with frozen water bottle as instructed previously to decrease recurrence of fasciitis symptoms -Patient to return to office as needed or sooner if problems or issues arise. Landis Martins, DPM

## 2018-11-08 DIAGNOSIS — F320 Major depressive disorder, single episode, mild: Secondary | ICD-10-CM | POA: Diagnosis not present

## 2018-11-08 DIAGNOSIS — Z0001 Encounter for general adult medical examination with abnormal findings: Secondary | ICD-10-CM | POA: Diagnosis not present

## 2018-11-08 DIAGNOSIS — Z6824 Body mass index (BMI) 24.0-24.9, adult: Secondary | ICD-10-CM | POA: Diagnosis not present

## 2018-11-08 DIAGNOSIS — E782 Mixed hyperlipidemia: Secondary | ICD-10-CM | POA: Diagnosis not present

## 2018-11-08 DIAGNOSIS — M21821 Other specified acquired deformities of right upper arm: Secondary | ICD-10-CM | POA: Diagnosis not present

## 2018-11-08 DIAGNOSIS — K580 Irritable bowel syndrome with diarrhea: Secondary | ICD-10-CM | POA: Diagnosis not present

## 2018-11-08 DIAGNOSIS — M810 Age-related osteoporosis without current pathological fracture: Secondary | ICD-10-CM | POA: Diagnosis not present

## 2018-11-08 DIAGNOSIS — I10 Essential (primary) hypertension: Secondary | ICD-10-CM | POA: Diagnosis not present

## 2018-11-13 DIAGNOSIS — L501 Idiopathic urticaria: Secondary | ICD-10-CM | POA: Diagnosis not present

## 2018-11-15 DIAGNOSIS — H0100B Unspecified blepharitis left eye, upper and lower eyelids: Secondary | ICD-10-CM | POA: Diagnosis not present

## 2018-11-15 DIAGNOSIS — H04129 Dry eye syndrome of unspecified lacrimal gland: Secondary | ICD-10-CM | POA: Diagnosis not present

## 2018-11-15 DIAGNOSIS — H0100A Unspecified blepharitis right eye, upper and lower eyelids: Secondary | ICD-10-CM | POA: Diagnosis not present

## 2018-12-07 DIAGNOSIS — M159 Polyosteoarthritis, unspecified: Secondary | ICD-10-CM | POA: Diagnosis not present

## 2018-12-07 DIAGNOSIS — M659 Synovitis and tenosynovitis, unspecified: Secondary | ICD-10-CM | POA: Diagnosis not present

## 2018-12-07 DIAGNOSIS — J309 Allergic rhinitis, unspecified: Secondary | ICD-10-CM | POA: Diagnosis not present

## 2018-12-07 DIAGNOSIS — E7800 Pure hypercholesterolemia, unspecified: Secondary | ICD-10-CM | POA: Diagnosis not present

## 2018-12-09 DIAGNOSIS — E7800 Pure hypercholesterolemia, unspecified: Secondary | ICD-10-CM | POA: Diagnosis not present

## 2018-12-09 DIAGNOSIS — M659 Synovitis and tenosynovitis, unspecified: Secondary | ICD-10-CM | POA: Diagnosis not present

## 2018-12-09 DIAGNOSIS — J309 Allergic rhinitis, unspecified: Secondary | ICD-10-CM | POA: Diagnosis not present

## 2018-12-09 DIAGNOSIS — M159 Polyosteoarthritis, unspecified: Secondary | ICD-10-CM | POA: Diagnosis not present

## 2018-12-18 DIAGNOSIS — L501 Idiopathic urticaria: Secondary | ICD-10-CM | POA: Diagnosis not present

## 2019-01-15 DIAGNOSIS — H10022 Other mucopurulent conjunctivitis, left eye: Secondary | ICD-10-CM | POA: Diagnosis not present

## 2019-01-31 DIAGNOSIS — K219 Gastro-esophageal reflux disease without esophagitis: Secondary | ICD-10-CM | POA: Diagnosis not present

## 2019-01-31 DIAGNOSIS — R1032 Left lower quadrant pain: Secondary | ICD-10-CM | POA: Diagnosis not present

## 2019-01-31 DIAGNOSIS — K591 Functional diarrhea: Secondary | ICD-10-CM | POA: Diagnosis not present

## 2019-01-31 DIAGNOSIS — R1013 Epigastric pain: Secondary | ICD-10-CM | POA: Diagnosis not present

## 2019-02-01 DIAGNOSIS — K219 Gastro-esophageal reflux disease without esophagitis: Secondary | ICD-10-CM | POA: Diagnosis not present

## 2019-02-13 DIAGNOSIS — E782 Mixed hyperlipidemia: Secondary | ICD-10-CM | POA: Diagnosis not present

## 2019-02-13 DIAGNOSIS — I10 Essential (primary) hypertension: Secondary | ICD-10-CM | POA: Diagnosis not present

## 2019-02-19 DIAGNOSIS — F320 Major depressive disorder, single episode, mild: Secondary | ICD-10-CM | POA: Diagnosis not present

## 2019-02-19 DIAGNOSIS — M810 Age-related osteoporosis without current pathological fracture: Secondary | ICD-10-CM | POA: Diagnosis not present

## 2019-02-19 DIAGNOSIS — E782 Mixed hyperlipidemia: Secondary | ICD-10-CM | POA: Diagnosis not present

## 2019-02-19 DIAGNOSIS — I10 Essential (primary) hypertension: Secondary | ICD-10-CM | POA: Diagnosis not present

## 2019-02-19 DIAGNOSIS — K580 Irritable bowel syndrome with diarrhea: Secondary | ICD-10-CM | POA: Diagnosis not present

## 2019-03-06 DIAGNOSIS — L821 Other seborrheic keratosis: Secondary | ICD-10-CM | POA: Diagnosis not present

## 2019-03-06 DIAGNOSIS — D692 Other nonthrombocytopenic purpura: Secondary | ICD-10-CM | POA: Diagnosis not present

## 2019-03-06 DIAGNOSIS — L718 Other rosacea: Secondary | ICD-10-CM | POA: Diagnosis not present

## 2019-03-07 DIAGNOSIS — K219 Gastro-esophageal reflux disease without esophagitis: Secondary | ICD-10-CM | POA: Diagnosis not present

## 2019-03-07 DIAGNOSIS — K591 Functional diarrhea: Secondary | ICD-10-CM | POA: Diagnosis not present

## 2019-03-27 DIAGNOSIS — M810 Age-related osteoporosis without current pathological fracture: Secondary | ICD-10-CM | POA: Diagnosis not present

## 2019-05-14 DIAGNOSIS — M159 Polyosteoarthritis, unspecified: Secondary | ICD-10-CM | POA: Diagnosis not present

## 2019-05-14 DIAGNOSIS — J309 Allergic rhinitis, unspecified: Secondary | ICD-10-CM | POA: Diagnosis not present

## 2019-05-14 DIAGNOSIS — E7800 Pure hypercholesterolemia, unspecified: Secondary | ICD-10-CM | POA: Diagnosis not present

## 2019-05-14 DIAGNOSIS — M659 Synovitis and tenosynovitis, unspecified: Secondary | ICD-10-CM | POA: Diagnosis not present

## 2019-05-15 DIAGNOSIS — J309 Allergic rhinitis, unspecified: Secondary | ICD-10-CM | POA: Diagnosis not present

## 2019-05-15 DIAGNOSIS — M159 Polyosteoarthritis, unspecified: Secondary | ICD-10-CM | POA: Diagnosis not present

## 2019-05-15 DIAGNOSIS — M659 Synovitis and tenosynovitis, unspecified: Secondary | ICD-10-CM | POA: Diagnosis not present

## 2019-05-15 DIAGNOSIS — E7800 Pure hypercholesterolemia, unspecified: Secondary | ICD-10-CM | POA: Diagnosis not present

## 2019-05-16 DIAGNOSIS — M659 Synovitis and tenosynovitis, unspecified: Secondary | ICD-10-CM | POA: Diagnosis not present

## 2019-05-16 DIAGNOSIS — M159 Polyosteoarthritis, unspecified: Secondary | ICD-10-CM | POA: Diagnosis not present

## 2019-05-16 DIAGNOSIS — E7800 Pure hypercholesterolemia, unspecified: Secondary | ICD-10-CM | POA: Diagnosis not present

## 2019-05-16 DIAGNOSIS — J309 Allergic rhinitis, unspecified: Secondary | ICD-10-CM | POA: Diagnosis not present

## 2019-05-31 DIAGNOSIS — M79645 Pain in left finger(s): Secondary | ICD-10-CM | POA: Diagnosis not present

## 2019-05-31 DIAGNOSIS — M1812 Unilateral primary osteoarthritis of first carpometacarpal joint, left hand: Secondary | ICD-10-CM | POA: Diagnosis not present

## 2019-06-11 DIAGNOSIS — D485 Neoplasm of uncertain behavior of skin: Secondary | ICD-10-CM | POA: Diagnosis not present

## 2019-06-11 DIAGNOSIS — L858 Other specified epidermal thickening: Secondary | ICD-10-CM | POA: Diagnosis not present

## 2019-06-11 DIAGNOSIS — L812 Freckles: Secondary | ICD-10-CM | POA: Diagnosis not present

## 2019-06-11 DIAGNOSIS — D2372 Other benign neoplasm of skin of left lower limb, including hip: Secondary | ICD-10-CM | POA: Diagnosis not present

## 2019-06-11 DIAGNOSIS — L905 Scar conditions and fibrosis of skin: Secondary | ICD-10-CM | POA: Diagnosis not present

## 2019-06-11 DIAGNOSIS — L509 Urticaria, unspecified: Secondary | ICD-10-CM | POA: Diagnosis not present

## 2019-06-11 DIAGNOSIS — D692 Other nonthrombocytopenic purpura: Secondary | ICD-10-CM | POA: Diagnosis not present

## 2019-06-11 DIAGNOSIS — L821 Other seborrheic keratosis: Secondary | ICD-10-CM | POA: Diagnosis not present

## 2019-07-11 DIAGNOSIS — S81811A Laceration without foreign body, right lower leg, initial encounter: Secondary | ICD-10-CM | POA: Diagnosis not present

## 2019-07-12 DIAGNOSIS — D692 Other nonthrombocytopenic purpura: Secondary | ICD-10-CM | POA: Diagnosis not present

## 2019-07-12 DIAGNOSIS — S81811A Laceration without foreign body, right lower leg, initial encounter: Secondary | ICD-10-CM | POA: Diagnosis not present

## 2019-07-13 DIAGNOSIS — S81811A Laceration without foreign body, right lower leg, initial encounter: Secondary | ICD-10-CM | POA: Diagnosis not present

## 2019-07-30 DIAGNOSIS — K591 Functional diarrhea: Secondary | ICD-10-CM | POA: Diagnosis not present

## 2019-07-30 DIAGNOSIS — K219 Gastro-esophageal reflux disease without esophagitis: Secondary | ICD-10-CM | POA: Diagnosis not present

## 2019-08-10 DIAGNOSIS — M85852 Other specified disorders of bone density and structure, left thigh: Secondary | ICD-10-CM | POA: Diagnosis not present

## 2019-08-10 DIAGNOSIS — Z1231 Encounter for screening mammogram for malignant neoplasm of breast: Secondary | ICD-10-CM | POA: Diagnosis not present

## 2019-08-10 DIAGNOSIS — M810 Age-related osteoporosis without current pathological fracture: Secondary | ICD-10-CM | POA: Diagnosis not present

## 2019-08-15 DIAGNOSIS — J309 Allergic rhinitis, unspecified: Secondary | ICD-10-CM | POA: Diagnosis not present

## 2019-08-15 DIAGNOSIS — Z20828 Contact with and (suspected) exposure to other viral communicable diseases: Secondary | ICD-10-CM | POA: Diagnosis not present

## 2019-08-15 DIAGNOSIS — E7800 Pure hypercholesterolemia, unspecified: Secondary | ICD-10-CM | POA: Diagnosis not present

## 2019-08-15 DIAGNOSIS — M159 Polyosteoarthritis, unspecified: Secondary | ICD-10-CM | POA: Diagnosis not present

## 2019-09-05 DIAGNOSIS — Z23 Encounter for immunization: Secondary | ICD-10-CM | POA: Diagnosis not present

## 2019-09-27 DIAGNOSIS — E782 Mixed hyperlipidemia: Secondary | ICD-10-CM | POA: Diagnosis not present

## 2019-09-27 DIAGNOSIS — Z6825 Body mass index (BMI) 25.0-25.9, adult: Secondary | ICD-10-CM | POA: Diagnosis not present

## 2019-09-27 DIAGNOSIS — I10 Essential (primary) hypertension: Secondary | ICD-10-CM | POA: Diagnosis not present

## 2019-09-27 DIAGNOSIS — I868 Varicose veins of other specified sites: Secondary | ICD-10-CM | POA: Diagnosis not present

## 2019-09-27 DIAGNOSIS — M810 Age-related osteoporosis without current pathological fracture: Secondary | ICD-10-CM | POA: Diagnosis not present

## 2019-09-27 DIAGNOSIS — F320 Major depressive disorder, single episode, mild: Secondary | ICD-10-CM | POA: Diagnosis not present

## 2019-10-11 ENCOUNTER — Encounter: Payer: Medicare Other | Admitting: Vascular Surgery

## 2019-10-11 ENCOUNTER — Ambulatory Visit (HOSPITAL_COMMUNITY): Payer: Medicare Other | Attending: Vascular Surgery

## 2019-11-05 DIAGNOSIS — H903 Sensorineural hearing loss, bilateral: Secondary | ICD-10-CM | POA: Diagnosis not present

## 2019-11-05 DIAGNOSIS — H9319 Tinnitus, unspecified ear: Secondary | ICD-10-CM | POA: Diagnosis not present

## 2019-11-05 DIAGNOSIS — Z8669 Personal history of other diseases of the nervous system and sense organs: Secondary | ICD-10-CM | POA: Diagnosis not present

## 2019-11-13 DIAGNOSIS — G542 Cervical root disorders, not elsewhere classified: Secondary | ICD-10-CM | POA: Diagnosis not present

## 2019-11-13 DIAGNOSIS — Z0001 Encounter for general adult medical examination with abnormal findings: Secondary | ICD-10-CM | POA: Diagnosis not present

## 2019-11-13 DIAGNOSIS — Z6824 Body mass index (BMI) 24.0-24.9, adult: Secondary | ICD-10-CM | POA: Diagnosis not present

## 2019-12-06 DIAGNOSIS — R2231 Localized swelling, mass and lump, right upper limb: Secondary | ICD-10-CM | POA: Diagnosis not present

## 2019-12-06 DIAGNOSIS — M1812 Unilateral primary osteoarthritis of first carpometacarpal joint, left hand: Secondary | ICD-10-CM | POA: Diagnosis not present

## 2019-12-06 DIAGNOSIS — M79644 Pain in right finger(s): Secondary | ICD-10-CM | POA: Insufficient documentation

## 2019-12-31 DIAGNOSIS — M542 Cervicalgia: Secondary | ICD-10-CM | POA: Diagnosis not present

## 2020-01-02 DIAGNOSIS — M1812 Unilateral primary osteoarthritis of first carpometacarpal joint, left hand: Secondary | ICD-10-CM | POA: Diagnosis not present

## 2020-01-02 DIAGNOSIS — Z4789 Encounter for other orthopedic aftercare: Secondary | ICD-10-CM | POA: Diagnosis not present

## 2020-01-02 DIAGNOSIS — R2231 Localized swelling, mass and lump, right upper limb: Secondary | ICD-10-CM | POA: Diagnosis not present

## 2020-01-02 DIAGNOSIS — R200 Anesthesia of skin: Secondary | ICD-10-CM | POA: Diagnosis not present

## 2020-01-04 DIAGNOSIS — F320 Major depressive disorder, single episode, mild: Secondary | ICD-10-CM | POA: Diagnosis not present

## 2020-01-04 DIAGNOSIS — M542 Cervicalgia: Secondary | ICD-10-CM | POA: Diagnosis not present

## 2020-01-04 DIAGNOSIS — F5101 Primary insomnia: Secondary | ICD-10-CM | POA: Diagnosis not present

## 2020-01-04 DIAGNOSIS — E782 Mixed hyperlipidemia: Secondary | ICD-10-CM | POA: Diagnosis not present

## 2020-01-04 DIAGNOSIS — Z6825 Body mass index (BMI) 25.0-25.9, adult: Secondary | ICD-10-CM | POA: Diagnosis not present

## 2020-01-04 DIAGNOSIS — I10 Essential (primary) hypertension: Secondary | ICD-10-CM | POA: Diagnosis not present

## 2020-01-07 DIAGNOSIS — I10 Essential (primary) hypertension: Secondary | ICD-10-CM | POA: Diagnosis not present

## 2020-01-15 DIAGNOSIS — M542 Cervicalgia: Secondary | ICD-10-CM | POA: Diagnosis not present

## 2020-01-18 DIAGNOSIS — M542 Cervicalgia: Secondary | ICD-10-CM | POA: Diagnosis not present

## 2020-01-24 DIAGNOSIS — E7800 Pure hypercholesterolemia, unspecified: Secondary | ICD-10-CM | POA: Diagnosis not present

## 2020-01-24 DIAGNOSIS — J309 Allergic rhinitis, unspecified: Secondary | ICD-10-CM | POA: Diagnosis not present

## 2020-01-24 DIAGNOSIS — M159 Polyosteoarthritis, unspecified: Secondary | ICD-10-CM | POA: Diagnosis not present

## 2020-01-24 DIAGNOSIS — Z20822 Contact with and (suspected) exposure to covid-19: Secondary | ICD-10-CM | POA: Diagnosis not present

## 2020-01-29 DIAGNOSIS — L509 Urticaria, unspecified: Secondary | ICD-10-CM | POA: Diagnosis not present

## 2020-02-08 ENCOUNTER — Other Ambulatory Visit: Payer: Self-pay

## 2020-02-08 MED ORDER — PRAVASTATIN SODIUM 20 MG PO TABS: 20.0000 mg | ORAL_TABLET | Freq: Every day | ORAL | 0 refills | Status: DC

## 2020-02-13 DIAGNOSIS — M4802 Spinal stenosis, cervical region: Secondary | ICD-10-CM | POA: Diagnosis not present

## 2020-03-03 ENCOUNTER — Other Ambulatory Visit: Payer: Self-pay | Admitting: Family Medicine

## 2020-03-11 ENCOUNTER — Other Ambulatory Visit: Payer: Self-pay | Admitting: Family Medicine

## 2020-03-14 DIAGNOSIS — M4316 Spondylolisthesis, lumbar region: Secondary | ICD-10-CM | POA: Diagnosis not present

## 2020-03-14 DIAGNOSIS — M19011 Primary osteoarthritis, right shoulder: Secondary | ICD-10-CM | POA: Diagnosis not present

## 2020-03-14 DIAGNOSIS — M4802 Spinal stenosis, cervical region: Secondary | ICD-10-CM | POA: Diagnosis not present

## 2020-03-21 DIAGNOSIS — M4802 Spinal stenosis, cervical region: Secondary | ICD-10-CM | POA: Diagnosis not present

## 2020-03-24 ENCOUNTER — Other Ambulatory Visit: Payer: Self-pay | Admitting: Family Medicine

## 2020-03-24 DIAGNOSIS — M19011 Primary osteoarthritis, right shoulder: Secondary | ICD-10-CM | POA: Diagnosis not present

## 2020-04-04 DIAGNOSIS — M4802 Spinal stenosis, cervical region: Secondary | ICD-10-CM | POA: Diagnosis not present

## 2020-04-04 DIAGNOSIS — I10 Essential (primary) hypertension: Secondary | ICD-10-CM | POA: Diagnosis not present

## 2020-04-04 DIAGNOSIS — Z6825 Body mass index (BMI) 25.0-25.9, adult: Secondary | ICD-10-CM | POA: Diagnosis not present

## 2020-04-09 DIAGNOSIS — C44629 Squamous cell carcinoma of skin of left upper limb, including shoulder: Secondary | ICD-10-CM | POA: Diagnosis not present

## 2020-04-09 DIAGNOSIS — D485 Neoplasm of uncertain behavior of skin: Secondary | ICD-10-CM | POA: Diagnosis not present

## 2020-04-09 DIAGNOSIS — C44529 Squamous cell carcinoma of skin of other part of trunk: Secondary | ICD-10-CM | POA: Diagnosis not present

## 2020-05-06 ENCOUNTER — Other Ambulatory Visit: Payer: Self-pay | Admitting: Family Medicine

## 2020-05-19 ENCOUNTER — Other Ambulatory Visit: Payer: Self-pay | Admitting: Family Medicine

## 2020-06-11 DIAGNOSIS — M13849 Other specified arthritis, unspecified hand: Secondary | ICD-10-CM | POA: Diagnosis not present

## 2020-06-11 DIAGNOSIS — M79644 Pain in right finger(s): Secondary | ICD-10-CM | POA: Diagnosis not present

## 2020-06-11 DIAGNOSIS — Z4789 Encounter for other orthopedic aftercare: Secondary | ICD-10-CM | POA: Diagnosis not present

## 2020-06-12 DIAGNOSIS — E7800 Pure hypercholesterolemia, unspecified: Secondary | ICD-10-CM | POA: Diagnosis not present

## 2020-06-12 DIAGNOSIS — M159 Polyosteoarthritis, unspecified: Secondary | ICD-10-CM | POA: Diagnosis not present

## 2020-06-12 DIAGNOSIS — G4700 Insomnia, unspecified: Secondary | ICD-10-CM | POA: Diagnosis not present

## 2020-06-12 DIAGNOSIS — R5382 Chronic fatigue, unspecified: Secondary | ICD-10-CM | POA: Diagnosis not present

## 2020-06-12 DIAGNOSIS — J309 Allergic rhinitis, unspecified: Secondary | ICD-10-CM | POA: Diagnosis not present

## 2020-06-16 ENCOUNTER — Other Ambulatory Visit: Payer: Self-pay | Admitting: Family Medicine

## 2020-07-08 DIAGNOSIS — M159 Polyosteoarthritis, unspecified: Secondary | ICD-10-CM | POA: Diagnosis not present

## 2020-07-08 DIAGNOSIS — M2550 Pain in unspecified joint: Secondary | ICD-10-CM | POA: Diagnosis not present

## 2020-07-18 DIAGNOSIS — H6980 Other specified disorders of Eustachian tube, unspecified ear: Secondary | ICD-10-CM | POA: Diagnosis not present

## 2020-07-18 DIAGNOSIS — Z87898 Personal history of other specified conditions: Secondary | ICD-10-CM | POA: Diagnosis not present

## 2020-07-18 DIAGNOSIS — H9319 Tinnitus, unspecified ear: Secondary | ICD-10-CM | POA: Diagnosis not present

## 2020-07-18 DIAGNOSIS — H9191 Unspecified hearing loss, right ear: Secondary | ICD-10-CM | POA: Diagnosis not present

## 2020-07-21 DIAGNOSIS — E7800 Pure hypercholesterolemia, unspecified: Secondary | ICD-10-CM | POA: Diagnosis not present

## 2020-07-21 DIAGNOSIS — K219 Gastro-esophageal reflux disease without esophagitis: Secondary | ICD-10-CM | POA: Diagnosis not present

## 2020-07-21 DIAGNOSIS — Z6825 Body mass index (BMI) 25.0-25.9, adult: Secondary | ICD-10-CM | POA: Diagnosis not present

## 2020-07-21 DIAGNOSIS — M545 Low back pain: Secondary | ICD-10-CM | POA: Diagnosis not present

## 2020-07-21 DIAGNOSIS — E663 Overweight: Secondary | ICD-10-CM | POA: Diagnosis not present

## 2020-07-21 DIAGNOSIS — M4316 Spondylolisthesis, lumbar region: Secondary | ICD-10-CM | POA: Diagnosis not present

## 2020-07-21 DIAGNOSIS — G4700 Insomnia, unspecified: Secondary | ICD-10-CM | POA: Diagnosis not present

## 2020-07-21 DIAGNOSIS — M159 Polyosteoarthritis, unspecified: Secondary | ICD-10-CM | POA: Diagnosis not present

## 2020-07-21 DIAGNOSIS — M129 Arthropathy, unspecified: Secondary | ICD-10-CM | POA: Diagnosis not present

## 2020-07-21 DIAGNOSIS — J309 Allergic rhinitis, unspecified: Secondary | ICD-10-CM | POA: Diagnosis not present

## 2020-07-21 DIAGNOSIS — K589 Irritable bowel syndrome without diarrhea: Secondary | ICD-10-CM | POA: Diagnosis not present

## 2020-07-21 DIAGNOSIS — F3341 Major depressive disorder, recurrent, in partial remission: Secondary | ICD-10-CM | POA: Diagnosis not present

## 2020-07-28 ENCOUNTER — Telehealth: Payer: Self-pay | Admitting: Gastroenterology

## 2020-07-28 DIAGNOSIS — Z6825 Body mass index (BMI) 25.0-25.9, adult: Secondary | ICD-10-CM | POA: Diagnosis not present

## 2020-07-28 DIAGNOSIS — F3341 Major depressive disorder, recurrent, in partial remission: Secondary | ICD-10-CM | POA: Diagnosis not present

## 2020-07-28 DIAGNOSIS — M545 Low back pain: Secondary | ICD-10-CM | POA: Diagnosis not present

## 2020-07-28 DIAGNOSIS — K589 Irritable bowel syndrome without diarrhea: Secondary | ICD-10-CM | POA: Diagnosis not present

## 2020-07-28 DIAGNOSIS — E663 Overweight: Secondary | ICD-10-CM | POA: Diagnosis not present

## 2020-07-28 DIAGNOSIS — E7800 Pure hypercholesterolemia, unspecified: Secondary | ICD-10-CM | POA: Diagnosis not present

## 2020-07-28 DIAGNOSIS — J309 Allergic rhinitis, unspecified: Secondary | ICD-10-CM | POA: Diagnosis not present

## 2020-07-28 DIAGNOSIS — K219 Gastro-esophageal reflux disease without esophagitis: Secondary | ICD-10-CM | POA: Diagnosis not present

## 2020-07-28 DIAGNOSIS — G4700 Insomnia, unspecified: Secondary | ICD-10-CM | POA: Diagnosis not present

## 2020-07-28 DIAGNOSIS — M159 Polyosteoarthritis, unspecified: Secondary | ICD-10-CM | POA: Diagnosis not present

## 2020-07-28 NOTE — Telephone Encounter (Signed)
Hey Dr Lyndel Safe, this pt is wishing to transfer her care to you, she has been seeing [facility], Dr Lyda Jester in [place] from 07/30/2019. Pt is experiencing severe diarrhea, nausea, belching.  Records are in epic to review, please advise on scheduling

## 2020-07-29 NOTE — Telephone Encounter (Signed)
No problems Can schedule with me or Colleen  RG

## 2020-08-01 ENCOUNTER — Encounter: Payer: Self-pay | Admitting: Gastroenterology

## 2020-08-05 ENCOUNTER — Telehealth: Payer: Self-pay | Admitting: Gastroenterology

## 2020-08-05 DIAGNOSIS — Z20828 Contact with and (suspected) exposure to other viral communicable diseases: Secondary | ICD-10-CM | POA: Diagnosis not present

## 2020-08-05 NOTE — Telephone Encounter (Signed)
Rec'd from [facility] forwarded 14 pages to Dr. Jackquline Denmark

## 2020-08-06 ENCOUNTER — Other Ambulatory Visit: Payer: Self-pay | Admitting: Family Medicine

## 2020-08-06 ENCOUNTER — Other Ambulatory Visit: Payer: Self-pay | Admitting: Physician Assistant

## 2020-08-06 DIAGNOSIS — M159 Polyosteoarthritis, unspecified: Secondary | ICD-10-CM | POA: Diagnosis not present

## 2020-08-06 DIAGNOSIS — G4700 Insomnia, unspecified: Secondary | ICD-10-CM | POA: Diagnosis not present

## 2020-08-06 DIAGNOSIS — E663 Overweight: Secondary | ICD-10-CM | POA: Diagnosis not present

## 2020-08-06 DIAGNOSIS — E7800 Pure hypercholesterolemia, unspecified: Secondary | ICD-10-CM | POA: Diagnosis not present

## 2020-08-06 DIAGNOSIS — Z6825 Body mass index (BMI) 25.0-25.9, adult: Secondary | ICD-10-CM | POA: Diagnosis not present

## 2020-08-06 DIAGNOSIS — J309 Allergic rhinitis, unspecified: Secondary | ICD-10-CM | POA: Diagnosis not present

## 2020-08-06 DIAGNOSIS — M545 Low back pain: Secondary | ICD-10-CM | POA: Diagnosis not present

## 2020-08-06 DIAGNOSIS — F3341 Major depressive disorder, recurrent, in partial remission: Secondary | ICD-10-CM | POA: Diagnosis not present

## 2020-08-06 DIAGNOSIS — K219 Gastro-esophageal reflux disease without esophagitis: Secondary | ICD-10-CM | POA: Diagnosis not present

## 2020-08-06 DIAGNOSIS — K589 Irritable bowel syndrome without diarrhea: Secondary | ICD-10-CM | POA: Diagnosis not present

## 2020-08-06 DIAGNOSIS — Z20822 Contact with and (suspected) exposure to covid-19: Secondary | ICD-10-CM | POA: Diagnosis not present

## 2020-08-07 DIAGNOSIS — M545 Low back pain: Secondary | ICD-10-CM | POA: Diagnosis not present

## 2020-08-07 DIAGNOSIS — Z85828 Personal history of other malignant neoplasm of skin: Secondary | ICD-10-CM | POA: Diagnosis not present

## 2020-08-07 DIAGNOSIS — L820 Inflamed seborrheic keratosis: Secondary | ICD-10-CM | POA: Diagnosis not present

## 2020-08-20 ENCOUNTER — Other Ambulatory Visit: Payer: Self-pay | Admitting: Gastroenterology

## 2020-08-26 DIAGNOSIS — M159 Polyosteoarthritis, unspecified: Secondary | ICD-10-CM | POA: Diagnosis not present

## 2020-08-26 DIAGNOSIS — E663 Overweight: Secondary | ICD-10-CM | POA: Diagnosis not present

## 2020-08-26 DIAGNOSIS — E785 Hyperlipidemia, unspecified: Secondary | ICD-10-CM | POA: Diagnosis not present

## 2020-08-26 DIAGNOSIS — F331 Major depressive disorder, recurrent, moderate: Secondary | ICD-10-CM | POA: Diagnosis not present

## 2020-08-26 DIAGNOSIS — E1169 Type 2 diabetes mellitus with other specified complication: Secondary | ICD-10-CM | POA: Diagnosis not present

## 2020-08-26 DIAGNOSIS — K219 Gastro-esophageal reflux disease without esophagitis: Secondary | ICD-10-CM | POA: Diagnosis not present

## 2020-08-26 DIAGNOSIS — G4700 Insomnia, unspecified: Secondary | ICD-10-CM | POA: Diagnosis not present

## 2020-08-26 DIAGNOSIS — Z6824 Body mass index (BMI) 24.0-24.9, adult: Secondary | ICD-10-CM | POA: Diagnosis not present

## 2020-08-26 DIAGNOSIS — K589 Irritable bowel syndrome without diarrhea: Secondary | ICD-10-CM | POA: Diagnosis not present

## 2020-08-26 DIAGNOSIS — E7800 Pure hypercholesterolemia, unspecified: Secondary | ICD-10-CM | POA: Diagnosis not present

## 2020-08-26 DIAGNOSIS — I10 Essential (primary) hypertension: Secondary | ICD-10-CM | POA: Diagnosis not present

## 2020-08-26 DIAGNOSIS — J309 Allergic rhinitis, unspecified: Secondary | ICD-10-CM | POA: Diagnosis not present

## 2020-08-26 DIAGNOSIS — F3341 Major depressive disorder, recurrent, in partial remission: Secondary | ICD-10-CM | POA: Diagnosis not present

## 2020-08-26 DIAGNOSIS — M545 Low back pain: Secondary | ICD-10-CM | POA: Diagnosis not present

## 2020-08-28 ENCOUNTER — Other Ambulatory Visit: Payer: Self-pay

## 2020-08-28 DIAGNOSIS — M4807 Spinal stenosis, lumbosacral region: Secondary | ICD-10-CM | POA: Diagnosis not present

## 2020-08-28 DIAGNOSIS — M48061 Spinal stenosis, lumbar region without neurogenic claudication: Secondary | ICD-10-CM | POA: Diagnosis not present

## 2020-08-28 DIAGNOSIS — M5136 Other intervertebral disc degeneration, lumbar region: Secondary | ICD-10-CM | POA: Diagnosis not present

## 2020-08-28 DIAGNOSIS — M5126 Other intervertebral disc displacement, lumbar region: Secondary | ICD-10-CM | POA: Diagnosis not present

## 2020-08-28 DIAGNOSIS — M4316 Spondylolisthesis, lumbar region: Secondary | ICD-10-CM | POA: Diagnosis not present

## 2020-09-02 DIAGNOSIS — M545 Low back pain: Secondary | ICD-10-CM | POA: Diagnosis not present

## 2020-09-02 DIAGNOSIS — Z6824 Body mass index (BMI) 24.0-24.9, adult: Secondary | ICD-10-CM | POA: Diagnosis not present

## 2020-09-02 DIAGNOSIS — M25551 Pain in right hip: Secondary | ICD-10-CM | POA: Diagnosis not present

## 2020-09-02 DIAGNOSIS — J309 Allergic rhinitis, unspecified: Secondary | ICD-10-CM | POA: Diagnosis not present

## 2020-09-02 DIAGNOSIS — K589 Irritable bowel syndrome without diarrhea: Secondary | ICD-10-CM | POA: Diagnosis not present

## 2020-09-02 DIAGNOSIS — K219 Gastro-esophageal reflux disease without esophagitis: Secondary | ICD-10-CM | POA: Diagnosis not present

## 2020-09-02 DIAGNOSIS — M159 Polyosteoarthritis, unspecified: Secondary | ICD-10-CM | POA: Diagnosis not present

## 2020-09-02 DIAGNOSIS — G4700 Insomnia, unspecified: Secondary | ICD-10-CM | POA: Diagnosis not present

## 2020-09-02 DIAGNOSIS — E663 Overweight: Secondary | ICD-10-CM | POA: Diagnosis not present

## 2020-09-02 DIAGNOSIS — F3341 Major depressive disorder, recurrent, in partial remission: Secondary | ICD-10-CM | POA: Diagnosis not present

## 2020-09-05 ENCOUNTER — Other Ambulatory Visit: Payer: Self-pay

## 2020-09-05 MED ORDER — PRAVASTATIN SODIUM 20 MG PO TABS: 20.0000 mg | ORAL_TABLET | Freq: Every day | ORAL | 0 refills | Status: DC

## 2020-09-17 DIAGNOSIS — M4316 Spondylolisthesis, lumbar region: Secondary | ICD-10-CM | POA: Diagnosis not present

## 2020-09-17 DIAGNOSIS — M19011 Primary osteoarthritis, right shoulder: Secondary | ICD-10-CM | POA: Diagnosis not present

## 2020-09-17 DIAGNOSIS — M4802 Spinal stenosis, cervical region: Secondary | ICD-10-CM | POA: Diagnosis not present

## 2020-09-22 DIAGNOSIS — M19011 Primary osteoarthritis, right shoulder: Secondary | ICD-10-CM | POA: Diagnosis not present

## 2020-09-23 DIAGNOSIS — M62551 Muscle wasting and atrophy, not elsewhere classified, right thigh: Secondary | ICD-10-CM | POA: Diagnosis not present

## 2020-09-23 DIAGNOSIS — M62561 Muscle wasting and atrophy, not elsewhere classified, right lower leg: Secondary | ICD-10-CM | POA: Diagnosis not present

## 2020-09-23 DIAGNOSIS — M4003 Postural kyphosis, cervicothoracic region: Secondary | ICD-10-CM | POA: Diagnosis not present

## 2020-09-23 DIAGNOSIS — R2689 Other abnormalities of gait and mobility: Secondary | ICD-10-CM | POA: Diagnosis not present

## 2020-09-23 DIAGNOSIS — M4802 Spinal stenosis, cervical region: Secondary | ICD-10-CM | POA: Diagnosis not present

## 2020-09-25 DIAGNOSIS — M5416 Radiculopathy, lumbar region: Secondary | ICD-10-CM | POA: Diagnosis not present

## 2020-09-25 DIAGNOSIS — M5116 Intervertebral disc disorders with radiculopathy, lumbar region: Secondary | ICD-10-CM | POA: Diagnosis not present

## 2020-09-30 ENCOUNTER — Other Ambulatory Visit: Payer: Self-pay

## 2020-09-30 DIAGNOSIS — M62551 Muscle wasting and atrophy, not elsewhere classified, right thigh: Secondary | ICD-10-CM | POA: Diagnosis not present

## 2020-09-30 DIAGNOSIS — M4802 Spinal stenosis, cervical region: Secondary | ICD-10-CM | POA: Diagnosis not present

## 2020-09-30 DIAGNOSIS — M4003 Postural kyphosis, cervicothoracic region: Secondary | ICD-10-CM | POA: Diagnosis not present

## 2020-09-30 DIAGNOSIS — R2689 Other abnormalities of gait and mobility: Secondary | ICD-10-CM | POA: Diagnosis not present

## 2020-09-30 DIAGNOSIS — M62561 Muscle wasting and atrophy, not elsewhere classified, right lower leg: Secondary | ICD-10-CM | POA: Diagnosis not present

## 2020-10-01 ENCOUNTER — Other Ambulatory Visit: Payer: Self-pay | Admitting: Gastroenterology

## 2020-10-01 DIAGNOSIS — Z23 Encounter for immunization: Secondary | ICD-10-CM | POA: Diagnosis not present

## 2020-10-01 MED ORDER — PRAVASTATIN SODIUM 20 MG PO TABS: 20.0000 mg | ORAL_TABLET | Freq: Every day | ORAL | 2 refills | Status: DC

## 2020-10-02 ENCOUNTER — Ambulatory Visit (INDEPENDENT_AMBULATORY_CARE_PROVIDER_SITE_OTHER): Payer: Medicare Other | Admitting: Gastroenterology

## 2020-10-02 ENCOUNTER — Encounter: Payer: Self-pay | Admitting: Gastroenterology

## 2020-10-02 VITALS — BP 138/84 | HR 70 | Ht 60.5 in | Wt 130.0 lb

## 2020-10-02 DIAGNOSIS — R1013 Epigastric pain: Secondary | ICD-10-CM

## 2020-10-02 DIAGNOSIS — K580 Irritable bowel syndrome with diarrhea: Secondary | ICD-10-CM

## 2020-10-02 DIAGNOSIS — K219 Gastro-esophageal reflux disease without esophagitis: Secondary | ICD-10-CM | POA: Diagnosis not present

## 2020-10-02 MED ORDER — FAMOTIDINE 20 MG PO TABS: 20.0000 mg | ORAL_TABLET | Freq: Every day | ORAL | 11 refills | Status: DC

## 2020-10-02 MED ORDER — PANTOPRAZOLE SODIUM 40 MG PO TBEC: 40.0000 mg | DELAYED_RELEASE_TABLET | Freq: Every day | ORAL | 11 refills | Status: DC

## 2020-10-02 MED ORDER — AMBULATORY NON FORMULARY MEDICATION: 1 refills | Status: DC

## 2020-10-02 NOTE — Patient Instructions (Signed)
If you are age 74 or older, your body mass index should be between 23-30. Your Body mass index is 24.97 kg/m. If this is out of the aforementioned range listed, please consider follow up with your Primary Care Provider.  If you are age 21 or younger, your body mass index should be between 19-25. Your Body mass index is 24.97 kg/m. If this is out of the aformentioned range listed, please consider follow up with your Primary Care Provider.    You have been scheduled for an abdominal ultrasound at [facility] (1st floor [address] ) on 10/08/20 at 10am. Please arrive 15 minutes prior to your appointment for registration. Make certain not to have anything to eat or drink 6 hours prior to your appointment. Should you need to reschedule your appointment, please contact radiology at [phone]. This test typically takes about 30 minutes to perform.  We have sent the following medications to your pharmacy for you to pick up at your convenience: Protonix  Pepcid GI Coktail  Follow up in 12 weeks.   Thank you,  Dr. Jackquline Denmark

## 2020-10-02 NOTE — Progress Notes (Signed)
Chief Complaint: Multiple GI complaints.  Referring Provider:  Rochel Brome, MD      ASSESSMENT AND PLAN;   #1. GERD with small HH with epi pain  #2. IBS-D   Plan: -Protonix 40mg  po qAM #30 -Add Pepcid 20mg  po qhs -GI coctail PRN 10 mL 4 times daily as needed. -Call in 2 weeks. -Please obtain previous records ([facility]/Dr Cox regarding any recent blood work especially TSH or celiac screen) -[place] abdo complete -FU in 3 months.  Earlier, if still with problems. -If still with problems and the above WU is neg, proceed with CT scan abdo/pelvis. If continues to have the upper GI problems, would consider esophageal manometry/pH study followed by eval for TIF.   HPI:    Meghan Tucker is a 74 y.o. female  With multiple medical problems including anxiety/depression, HTN, HLD, OA with chronic low back pain, migraine HAs Also with multiple GI problems and has been to multiple gastroenterologists.  Had extensive GI work-up in the past.  -C/O indigestion with reflux and occasional nausea and epi pain x >66yrs. No odynophagia or dysphagia.  Associated with random episodes of belching and would burp over 30 times during these episodes.  Epi pain does get worse occasionally after eating.  It is nonradiating. Denies having any particular food triggers.  Has been on multiple PPIs including omeprazole and most recently Protonix with some but not complete relief.  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  She also has been avoiding milk and milk products.  -Has been having intermittent diarrhea especially after eating with urgency. Neg colonoscopy to TI with random colonic biopsies.  Dx with IBS-D.  Would also have occasional constipation but not usually.  Somewhat better with dicyclomine, Metamucil, fiber choice and align.  Lower abdominal pain is almost completely resolved.  Does admit that stress causes her to have more diarrhea.  -No weight loss or loss of  appetite. No sodas, chocolates, chewing gums, artificial sweeteners and candy. No NSAIDs.  Previous GI procedures: -EGD 06/09/2017: Hiatal hernia (Dr Earlean Shawl) -Colonoscopy 06/09/2017: Sigmoid colonic diverticulosis, internal hemorrhoids.  Negative random colonic biopsies. -Ba swallow 02/01/2019: Small HH, 2 episodes of reflux. Ba tab passed without any difficulty.  Past Medical History:  Diagnosis Date  . Arthritis    "all over"  . Chronic back pain    2 buldging disc,stenosis  . Diverticulosis   . GERD (gastroesophageal reflux disease)    takes Omeprazole daily  . History of hiatal hernia   . History of kidney stones   . History of kidney stones   . History of migraine    none since menopause yrs ago  . Hyperlipidemia    takes Fish Oil daily  . Hypertension    takes Metoprolol daily  . Insomnia    takes Ambien nightly  . Sciatica   . Sciatica   . Tingling    right arm.Both feet     Past Surgical History:  Procedure Laterality Date  . CARPAL TUNNEL RELEASE Left   . cataract surgery Bilateral   . COLONOSCOPY     Dr. Jerilynn Mages in [place]  . ESOPHAGOGASTRODUODENOSCOPY     Dr Jerilynn Mages in [place]  . ESOPHAGOGASTRODUODENOSCOPY (EGD) WITH ESOPHAGEAL DILATION  05/14/2015   with Propofol  . ESOPHAGOGASTRODUODENOSCOPY (EGD) WITH PROPOFOL  10/19/2013  . GANGLION CYST EXCISION    . HAMMERTOE RECONSTRUCTION WITH WEIL OSTEOTOMY Left 07/17/2015   Procedure: LEFT 2-3 WEIL OSTEOTOMY AND HAMMERTOE  CORRECTION ;  Surgeon: [name], MD;  Location: [place];  Service: Orthopedics;  Laterality: Left;    Family History  Problem Relation Age of Onset  . Heart disease Father   . Heart attack Father 17  . Stroke Mother 78  . Colon cancer Neg Hx   . Esophageal cancer Neg Hx   . Cancer Neg Hx     Social History   Tobacco Use  . Smoking status: Never Smoker  . Smokeless tobacco: Never Used  Vaping Use  . Vaping Use: Never used  Substance Use Topics  . Alcohol use: Yes     Comment: 1-2 glasses champagne nightly  . Drug use: No    Current Outpatient Medications  Medication Sig Dispense Refill  . Biotin (BIOTIN 5000) 5 MG CAPS Take 5 mg by mouth daily.    . citalopram (CELEXA) 10 MG tablet TAKE 1 TABLET ONCE DAILY 90 tablet 3  . denosumab (PROLIA) 60 MG/ML SOLN injection Inject 60 mg into the skin every 6 (six) months. Twice a year    . methylPREDNISolone (MEDROL DOSEPAK) 4 MG TBPK tablet Take 1st as instructed 21 tablet 0  . metoprolol succinate (TOPROL-XL) 25 MG 24 hr tablet TAKE 1 TABLET ONCE DAILY. 90 tablet 1  . mometasone (NASONEX) 50 MCG/ACT nasal spray Place 2 sprays into the nose daily as needed (for nasal congestion).    . Multiple Minerals-Vitamins (GNP CAL MAG ZINC +D3) TABS Take 1 tablet by mouth every 36 (thirty-six) hours.    . Omega-3 Fatty Acids (FISH OIL) 1000 MG CAPS Take 1,000 mg by mouth daily.     Marland Kitchen omeprazole (PRILOSEC OTC) 20 MG tablet Take 20 mg by mouth every other day.    Marland Kitchen OVER THE COUNTER MEDICATION Place 2 drops under the tongue daily. HEMP DROPS    . oxyCODONE-acetaminophen (PERCOCET/ROXICET) 5-325 MG tablet Take 0.5 tablets by mouth 2 (two) times daily.    Vladimir Faster Glycol-Propyl Glycol (SYSTANE) 0.4-0.3 % SOLN Place 1 drop into both eyes 3 (three) times daily as needed (for dry eyes).    . potassium chloride (K-DUR,KLOR-CON) 10 MEQ tablet Take 10 mEq by mouth daily.    . potassium chloride (KLOR-CON) 10 MEQ tablet TAKE 1 TABLET ONCE DAILY. 90 tablet 1  . pravastatin (PRAVACHOL) 20 MG tablet Take 1 tablet (20 mg total) by mouth daily. 30 tablet 2  . temazepam (RESTORIL) 15 MG capsule TAKE 1 CAPSULE BY MOUTH AT BEDTIME AS NEEDED FOR INSOMNIA 30 capsule 4  . Vitamin D, Ergocalciferol, (DRISDOL) 1.25 MG (50000 UNIT) CAPS capsule TAKE 1 CAPSULE ONCE A WEEK. 12 capsule 0   No current facility-administered medications for this visit.    Allergies  Allergen Reactions  . Atorvastatin Other (See Comments)    MYALGIAS MYALGIAS  .  Rosuvastatin Other (See Comments)    MYALGIAS MYALGIAS    Review of Systems:  Constitutional: Denies fever, chills, diaphoresis, appetite change and fatigue.  HEENT: Has multiple allergies.  Has hearing problems. Respiratory: Denies SOB, DOE, chest tightness,  and wheezing.  Occasional cough. Cardiovascular: Denies chest pain, palpitations and leg swelling.  Genitourinary: Denies dysuria, urgency, frequency, hematuria, flank pain and difficulty urinating.  Musculoskeletal: Denies myalgias, has back pain, has arthritis -joint swelling, arthralgias and gait problem.  Skin: No rash.  Neurological: Denies dizziness, seizures, syncope, weakness, light-headedness, numbness and headaches.  Hematological: Denies adenopathy. Easy bruising, personal or family bleeding history  Psychiatric/Behavioral:Has anxiety or depression     Physical  Exam:    BP 138/84   Pulse 70   Ht 5' 0.5" (1.537 m)   Wt 130 lb (59 kg)   BMI 24.97 kg/m  Wt Readings from Last 3 Encounters:  10/02/20 130 lb (59 kg)  10/26/16 126 lb (57.2 kg)  10/21/16 126 lb (57.2 kg)   Constitutional:  Well-developed, in no acute distress. Psychiatric: Normal mood and affect. Behavior is normal. HEENT: Pupils normal.  Conjunctivae are normal. No scleral icterus. Neck supple.  Cardiovascular: Normal rate, regular rhythm. No edema Pulmonary/chest: Effort normal and breath sounds normal. No wheezing, rales or rhonchi. Abdominal: Soft, nondistended. Nontender. Bowel sounds active throughout. There are no masses palpable. No hepatomegaly. Rectal:  defered Neurological: Alert and oriented to person place and time. Skin: Skin is warm and dry. No rashes noted.  Data Reviewed: I have personally reviewed following labs and imaging studies  CBC: CBC Latest Ref Rng & Units 10/21/2016 07/17/2015  WBC 4.0 - 10.5 K/uL 6.2 -  Hemoglobin 12.0 - 15.0 g/dL 14.7 12.4  Hematocrit 36 - 46 % 44.3 -  Platelets 150 - 400 K/uL 253 -     CMP: CMP Latest Ref Rng & Units 10/21/2016  Glucose 65 - 99 mg/dL 123(H)  BUN 6 - 20 mg/dL 14  Creatinine 0.44 - 1.00 mg/dL 0.74  Sodium 135 - 145 mmol/L 137  Potassium 3.5 - 5.1 mmol/L 4.0  Chloride 101 - 111 mmol/L 105  CO2 22 - 32 mmol/L 26  Calcium 8.9 - 10.3 mg/dL 9.2  Extensive notes were reviewed.    Carmell Austria, MD 10/02/2020, 2:37 PM  Cc: Rochel Brome, MD

## 2020-10-03 DIAGNOSIS — M62551 Muscle wasting and atrophy, not elsewhere classified, right thigh: Secondary | ICD-10-CM | POA: Diagnosis not present

## 2020-10-03 DIAGNOSIS — M4802 Spinal stenosis, cervical region: Secondary | ICD-10-CM | POA: Diagnosis not present

## 2020-10-03 DIAGNOSIS — M4003 Postural kyphosis, cervicothoracic region: Secondary | ICD-10-CM | POA: Diagnosis not present

## 2020-10-03 DIAGNOSIS — R2689 Other abnormalities of gait and mobility: Secondary | ICD-10-CM | POA: Diagnosis not present

## 2020-10-03 DIAGNOSIS — M62561 Muscle wasting and atrophy, not elsewhere classified, right lower leg: Secondary | ICD-10-CM | POA: Diagnosis not present

## 2020-10-04 DIAGNOSIS — M545 Low back pain, unspecified: Secondary | ICD-10-CM | POA: Diagnosis not present

## 2020-10-04 DIAGNOSIS — K589 Irritable bowel syndrome without diarrhea: Secondary | ICD-10-CM | POA: Diagnosis not present

## 2020-10-04 DIAGNOSIS — Z6824 Body mass index (BMI) 24.0-24.9, adult: Secondary | ICD-10-CM | POA: Diagnosis not present

## 2020-10-04 DIAGNOSIS — R233 Spontaneous ecchymoses: Secondary | ICD-10-CM | POA: Diagnosis not present

## 2020-10-04 DIAGNOSIS — E663 Overweight: Secondary | ICD-10-CM | POA: Diagnosis not present

## 2020-10-04 DIAGNOSIS — K219 Gastro-esophageal reflux disease without esophagitis: Secondary | ICD-10-CM | POA: Diagnosis not present

## 2020-10-04 DIAGNOSIS — M159 Polyosteoarthritis, unspecified: Secondary | ICD-10-CM | POA: Diagnosis not present

## 2020-10-04 DIAGNOSIS — F3341 Major depressive disorder, recurrent, in partial remission: Secondary | ICD-10-CM | POA: Diagnosis not present

## 2020-10-04 DIAGNOSIS — R251 Tremor, unspecified: Secondary | ICD-10-CM | POA: Diagnosis not present

## 2020-10-04 DIAGNOSIS — J309 Allergic rhinitis, unspecified: Secondary | ICD-10-CM | POA: Diagnosis not present

## 2020-10-04 DIAGNOSIS — R238 Other skin changes: Secondary | ICD-10-CM | POA: Diagnosis not present

## 2020-10-04 DIAGNOSIS — R5382 Chronic fatigue, unspecified: Secondary | ICD-10-CM | POA: Diagnosis not present

## 2020-10-04 DIAGNOSIS — G4700 Insomnia, unspecified: Secondary | ICD-10-CM | POA: Diagnosis not present

## 2020-10-08 ENCOUNTER — Other Ambulatory Visit (HOSPITAL_BASED_OUTPATIENT_CLINIC_OR_DEPARTMENT_OTHER): Payer: Medicare Other

## 2020-10-13 ENCOUNTER — Other Ambulatory Visit: Payer: Self-pay

## 2020-10-13 ENCOUNTER — Ambulatory Visit (HOSPITAL_BASED_OUTPATIENT_CLINIC_OR_DEPARTMENT_OTHER)
Admission: RE | Admit: 2020-10-13 | Discharge: 2020-10-13 | Disposition: A | Discharge status: Home / Self Care | Payer: Medicare Other | Source: Ambulatory Visit | Attending: Gastroenterology | Admitting: Gastroenterology

## 2020-10-13 DIAGNOSIS — K580 Irritable bowel syndrome with diarrhea: Secondary | ICD-10-CM | POA: Diagnosis not present

## 2020-10-13 DIAGNOSIS — R1013 Epigastric pain: Secondary | ICD-10-CM | POA: Diagnosis not present

## 2020-10-13 DIAGNOSIS — K219 Gastro-esophageal reflux disease without esophagitis: Secondary | ICD-10-CM

## 2020-10-14 DIAGNOSIS — K589 Irritable bowel syndrome without diarrhea: Secondary | ICD-10-CM | POA: Diagnosis not present

## 2020-10-14 DIAGNOSIS — K219 Gastro-esophageal reflux disease without esophagitis: Secondary | ICD-10-CM | POA: Diagnosis not present

## 2020-10-14 DIAGNOSIS — J309 Allergic rhinitis, unspecified: Secondary | ICD-10-CM | POA: Diagnosis not present

## 2020-10-14 DIAGNOSIS — M62551 Muscle wasting and atrophy, not elsewhere classified, right thigh: Secondary | ICD-10-CM | POA: Diagnosis not present

## 2020-10-14 DIAGNOSIS — R251 Tremor, unspecified: Secondary | ICD-10-CM | POA: Diagnosis not present

## 2020-10-14 DIAGNOSIS — M159 Polyosteoarthritis, unspecified: Secondary | ICD-10-CM | POA: Diagnosis not present

## 2020-10-14 DIAGNOSIS — G4700 Insomnia, unspecified: Secondary | ICD-10-CM | POA: Diagnosis not present

## 2020-10-14 DIAGNOSIS — E663 Overweight: Secondary | ICD-10-CM | POA: Diagnosis not present

## 2020-10-14 DIAGNOSIS — R2689 Other abnormalities of gait and mobility: Secondary | ICD-10-CM | POA: Diagnosis not present

## 2020-10-14 DIAGNOSIS — M25551 Pain in right hip: Secondary | ICD-10-CM | POA: Diagnosis not present

## 2020-10-14 DIAGNOSIS — M62561 Muscle wasting and atrophy, not elsewhere classified, right lower leg: Secondary | ICD-10-CM | POA: Diagnosis not present

## 2020-10-14 DIAGNOSIS — E7800 Pure hypercholesterolemia, unspecified: Secondary | ICD-10-CM | POA: Diagnosis not present

## 2020-10-14 DIAGNOSIS — R238 Other skin changes: Secondary | ICD-10-CM | POA: Diagnosis not present

## 2020-10-14 DIAGNOSIS — F3341 Major depressive disorder, recurrent, in partial remission: Secondary | ICD-10-CM | POA: Diagnosis not present

## 2020-10-14 DIAGNOSIS — M4003 Postural kyphosis, cervicothoracic region: Secondary | ICD-10-CM | POA: Diagnosis not present

## 2020-10-14 DIAGNOSIS — M545 Low back pain, unspecified: Secondary | ICD-10-CM | POA: Diagnosis not present

## 2020-10-14 DIAGNOSIS — M4802 Spinal stenosis, cervical region: Secondary | ICD-10-CM | POA: Diagnosis not present

## 2020-10-17 DIAGNOSIS — M62561 Muscle wasting and atrophy, not elsewhere classified, right lower leg: Secondary | ICD-10-CM | POA: Diagnosis not present

## 2020-10-17 DIAGNOSIS — M4003 Postural kyphosis, cervicothoracic region: Secondary | ICD-10-CM | POA: Diagnosis not present

## 2020-10-17 DIAGNOSIS — M62551 Muscle wasting and atrophy, not elsewhere classified, right thigh: Secondary | ICD-10-CM | POA: Diagnosis not present

## 2020-10-17 DIAGNOSIS — R2689 Other abnormalities of gait and mobility: Secondary | ICD-10-CM | POA: Diagnosis not present

## 2020-10-17 DIAGNOSIS — M4802 Spinal stenosis, cervical region: Secondary | ICD-10-CM | POA: Diagnosis not present

## 2020-10-21 DIAGNOSIS — F3341 Major depressive disorder, recurrent, in partial remission: Secondary | ICD-10-CM | POA: Diagnosis not present

## 2020-10-21 DIAGNOSIS — R251 Tremor, unspecified: Secondary | ICD-10-CM | POA: Diagnosis not present

## 2020-10-21 DIAGNOSIS — E7800 Pure hypercholesterolemia, unspecified: Secondary | ICD-10-CM | POA: Diagnosis not present

## 2020-10-21 DIAGNOSIS — Z6824 Body mass index (BMI) 24.0-24.9, adult: Secondary | ICD-10-CM | POA: Diagnosis not present

## 2020-10-21 DIAGNOSIS — E663 Overweight: Secondary | ICD-10-CM | POA: Diagnosis not present

## 2020-10-21 DIAGNOSIS — M659 Synovitis and tenosynovitis, unspecified: Secondary | ICD-10-CM | POA: Diagnosis not present

## 2020-10-21 DIAGNOSIS — G4700 Insomnia, unspecified: Secondary | ICD-10-CM | POA: Diagnosis not present

## 2020-10-21 DIAGNOSIS — M545 Low back pain, unspecified: Secondary | ICD-10-CM | POA: Diagnosis not present

## 2020-10-21 DIAGNOSIS — M159 Polyosteoarthritis, unspecified: Secondary | ICD-10-CM | POA: Diagnosis not present

## 2020-10-21 DIAGNOSIS — K219 Gastro-esophageal reflux disease without esophagitis: Secondary | ICD-10-CM | POA: Diagnosis not present

## 2020-10-21 DIAGNOSIS — K589 Irritable bowel syndrome without diarrhea: Secondary | ICD-10-CM | POA: Diagnosis not present

## 2020-10-21 DIAGNOSIS — J309 Allergic rhinitis, unspecified: Secondary | ICD-10-CM | POA: Diagnosis not present

## 2020-10-24 DIAGNOSIS — B9689 Other specified bacterial agents as the cause of diseases classified elsewhere: Secondary | ICD-10-CM | POA: Diagnosis not present

## 2020-10-24 DIAGNOSIS — G4700 Insomnia, unspecified: Secondary | ICD-10-CM | POA: Diagnosis not present

## 2020-10-24 DIAGNOSIS — J028 Acute pharyngitis due to other specified organisms: Secondary | ICD-10-CM | POA: Diagnosis not present

## 2020-10-24 DIAGNOSIS — R5382 Chronic fatigue, unspecified: Secondary | ICD-10-CM | POA: Diagnosis not present

## 2020-10-24 DIAGNOSIS — Z20822 Contact with and (suspected) exposure to covid-19: Secondary | ICD-10-CM | POA: Diagnosis not present

## 2020-10-24 DIAGNOSIS — M545 Low back pain, unspecified: Secondary | ICD-10-CM | POA: Diagnosis not present

## 2020-10-24 DIAGNOSIS — J309 Allergic rhinitis, unspecified: Secondary | ICD-10-CM | POA: Diagnosis not present

## 2020-10-24 DIAGNOSIS — M159 Polyosteoarthritis, unspecified: Secondary | ICD-10-CM | POA: Diagnosis not present

## 2020-10-24 DIAGNOSIS — K219 Gastro-esophageal reflux disease without esophagitis: Secondary | ICD-10-CM | POA: Diagnosis not present

## 2020-10-24 DIAGNOSIS — K589 Irritable bowel syndrome without diarrhea: Secondary | ICD-10-CM | POA: Diagnosis not present

## 2020-10-24 DIAGNOSIS — F3341 Major depressive disorder, recurrent, in partial remission: Secondary | ICD-10-CM | POA: Diagnosis not present

## 2020-10-24 DIAGNOSIS — R251 Tremor, unspecified: Secondary | ICD-10-CM | POA: Diagnosis not present

## 2020-10-27 ENCOUNTER — Other Ambulatory Visit: Payer: Self-pay | Admitting: Hematology and Oncology

## 2020-10-27 DIAGNOSIS — R233 Spontaneous ecchymoses: Secondary | ICD-10-CM

## 2020-10-28 DIAGNOSIS — R7989 Other specified abnormal findings of blood chemistry: Secondary | ICD-10-CM | POA: Diagnosis not present

## 2020-10-28 DIAGNOSIS — F3341 Major depressive disorder, recurrent, in partial remission: Secondary | ICD-10-CM | POA: Diagnosis not present

## 2020-10-28 DIAGNOSIS — K589 Irritable bowel syndrome without diarrhea: Secondary | ICD-10-CM | POA: Diagnosis not present

## 2020-10-28 DIAGNOSIS — K219 Gastro-esophageal reflux disease without esophagitis: Secondary | ICD-10-CM | POA: Diagnosis not present

## 2020-10-28 DIAGNOSIS — E538 Deficiency of other specified B group vitamins: Secondary | ICD-10-CM | POA: Diagnosis not present

## 2020-10-28 DIAGNOSIS — M545 Low back pain, unspecified: Secondary | ICD-10-CM | POA: Diagnosis not present

## 2020-10-28 DIAGNOSIS — M159 Polyosteoarthritis, unspecified: Secondary | ICD-10-CM | POA: Diagnosis not present

## 2020-10-28 DIAGNOSIS — R945 Abnormal results of liver function studies: Secondary | ICD-10-CM | POA: Diagnosis not present

## 2020-10-28 DIAGNOSIS — E7800 Pure hypercholesterolemia, unspecified: Secondary | ICD-10-CM | POA: Diagnosis not present

## 2020-10-28 DIAGNOSIS — R251 Tremor, unspecified: Secondary | ICD-10-CM | POA: Diagnosis not present

## 2020-10-28 DIAGNOSIS — J309 Allergic rhinitis, unspecified: Secondary | ICD-10-CM | POA: Diagnosis not present

## 2020-10-28 DIAGNOSIS — R6889 Other general symptoms and signs: Secondary | ICD-10-CM | POA: Diagnosis not present

## 2020-10-28 DIAGNOSIS — G4700 Insomnia, unspecified: Secondary | ICD-10-CM | POA: Diagnosis not present

## 2020-10-29 ENCOUNTER — Telehealth: Payer: Self-pay | Admitting: Gastroenterology

## 2020-10-29 NOTE — Telephone Encounter (Signed)
Spoke to patient who  reports abdominal pain,hemorrhoids, nausea and intermittent fever and fatigue  over the past 10 days. She has been tested for Covid and the flu this past week and was noted negative. She states all her symptoms are related to her stomach. She recently had an office visit on 10/02/20 with Dr Ladona Ridgel from [facility]Colon. Procedures describe small HH and diverticulosis of sigmoid colon. Records sent to [facility] for scanning. She had a normal [place] 10/13/20. It was suggested that patient have a a CT scan abdo/pelvis per Dr Leland Her 10/7/21office note, she was insisting on being seen in office to address her problems then discuss CT. No available appointments with Dr Lyndel Safe until December. Appointment with Nicoletta Ba PA for 10/31/20. Records requested from Dr Cher Nakai.

## 2020-10-29 NOTE — Telephone Encounter (Signed)
Pt states that her sxs have worsened since she last time saw him. She has been on bed for the past 10 days with low energy, nausea, lower gut sxs, and chills.

## 2020-10-30 DIAGNOSIS — R634 Abnormal weight loss: Secondary | ICD-10-CM | POA: Diagnosis not present

## 2020-10-30 DIAGNOSIS — M2550 Pain in unspecified joint: Secondary | ICD-10-CM | POA: Diagnosis not present

## 2020-10-30 DIAGNOSIS — R5383 Other fatigue: Secondary | ICD-10-CM | POA: Diagnosis not present

## 2020-10-30 DIAGNOSIS — R251 Tremor, unspecified: Secondary | ICD-10-CM | POA: Diagnosis not present

## 2020-10-30 DIAGNOSIS — F3341 Major depressive disorder, recurrent, in partial remission: Secondary | ICD-10-CM | POA: Diagnosis not present

## 2020-10-30 DIAGNOSIS — J309 Allergic rhinitis, unspecified: Secondary | ICD-10-CM | POA: Diagnosis not present

## 2020-10-30 DIAGNOSIS — M159 Polyosteoarthritis, unspecified: Secondary | ICD-10-CM | POA: Diagnosis not present

## 2020-10-30 DIAGNOSIS — M545 Low back pain, unspecified: Secondary | ICD-10-CM | POA: Diagnosis not present

## 2020-10-30 DIAGNOSIS — G4700 Insomnia, unspecified: Secondary | ICD-10-CM | POA: Diagnosis not present

## 2020-10-30 DIAGNOSIS — K219 Gastro-esophageal reflux disease without esophagitis: Secondary | ICD-10-CM | POA: Diagnosis not present

## 2020-10-30 DIAGNOSIS — W57XXXA Bitten or stung by nonvenomous insect and other nonvenomous arthropods, initial encounter: Secondary | ICD-10-CM | POA: Diagnosis not present

## 2020-10-30 DIAGNOSIS — R059 Cough, unspecified: Secondary | ICD-10-CM | POA: Diagnosis not present

## 2020-10-30 DIAGNOSIS — K589 Irritable bowel syndrome without diarrhea: Secondary | ICD-10-CM | POA: Diagnosis not present

## 2020-10-31 ENCOUNTER — Telehealth: Payer: Self-pay | Admitting: Oncology

## 2020-10-31 ENCOUNTER — Other Ambulatory Visit (INDEPENDENT_AMBULATORY_CARE_PROVIDER_SITE_OTHER): Payer: Medicare Other

## 2020-10-31 ENCOUNTER — Ambulatory Visit (INDEPENDENT_AMBULATORY_CARE_PROVIDER_SITE_OTHER): Payer: Medicare Other | Admitting: Physician Assistant

## 2020-10-31 ENCOUNTER — Encounter: Payer: Self-pay | Admitting: Physician Assistant

## 2020-10-31 ENCOUNTER — Telehealth: Payer: Self-pay | Admitting: Physician Assistant

## 2020-10-31 VITALS — BP 140/82 | HR 89 | Ht 60.5 in | Wt 124.0 lb

## 2020-10-31 DIAGNOSIS — K219 Gastro-esophageal reflux disease without esophagitis: Secondary | ICD-10-CM

## 2020-10-31 DIAGNOSIS — R1031 Right lower quadrant pain: Secondary | ICD-10-CM

## 2020-10-31 DIAGNOSIS — K589 Irritable bowel syndrome without diarrhea: Secondary | ICD-10-CM | POA: Diagnosis not present

## 2020-10-31 DIAGNOSIS — R142 Eructation: Secondary | ICD-10-CM | POA: Diagnosis not present

## 2020-10-31 HISTORY — DX: Irritable bowel syndrome, unspecified: K58.9

## 2020-10-31 LAB — BASIC METABOLIC PANEL
BUN: 27 mg/dL — ABNORMAL HIGH (ref 6–23)
CO2: 28 mEq/L (ref 19–32)
Calcium: 9.1 mg/dL (ref 8.4–10.5)
Chloride: 105 mEq/L (ref 96–112)
Creatinine, Ser: 0.88 mg/dL (ref 0.40–1.20)
GFR: 64.94 mL/min (ref >60.00)
Glucose, Bld: 93 mg/dL (ref 70–99)
Potassium: 4.5 mEq/L (ref 3.5–5.1)
Sodium: 140 mEq/L (ref 135–145)

## 2020-10-31 MED ORDER — DICYCLOMINE HCL 10 MG PO CAPS
10.0000 mg | ORAL_CAPSULE | Freq: Two times a day (BID) | ORAL | 3 refills | Status: DC
Start: 2020-10-31 — End: 2024-04-06

## 2020-10-31 NOTE — Patient Instructions (Signed)
If you are age 74 or older, your body mass index should be between 23-30. Your Body mass index is 23.82 kg/m. If this is out of the aforementioned range listed, please consider follow up with your Primary Care Provider.  If you are age 74 or younger, your body mass index should be between 19-25. Your Body mass index is 23.82 kg/m. If this is out of the aformentioned range listed, please consider follow up with your Primary Care Provider.   You have been given a testing kit to check for small intestine bacterial overgrowth (SIBO) which is completed by a company named [employer]. Make sure to return your test in the mail using the return mailing label given to you along with the kit. Your demographic and insurance information have already been sent to the company and they should be in contact with you over the next week regarding this test. [facility] will collect an upfront charge of $99.74 for commercial insurance plans and $209.74 is you are paying cash. Make sure to discuss with [facility] PRIOR to having the test if they have gotten informatoin from your insurance company as to how much your testing will cost out of pocket, if any. Please keep in mind that you will be getting a call from phone number [phone] or a similar number. If you do not hear from them within this time frame, please call our office at [phone].   You have been scheduled for a CT scan of the abdomen and pelvis at [facility], 1st floor Radiology. You are scheduled on 11/04/20  at 1:30 pm. You should arrive 15 minutes prior to your appointment time for registration.  Please pick up 2 bottles of contrast from [place] at least 3 days prior to your scan. The solution may taste better if refrigerated, but do NOT add ice or any other liquid to this solution. Shake well before drinking.   Please follow the written instructions below on the day of your exam:   1) Do not eat anything after 9:30 am (4  hours prior to your test)   2) Drink 1 bottle of contrast @ 11:30 am (2 hours prior to your exam)  Remember to shake well before drinking and do NOT pour over ice.     Drink 1 bottle of contrast @ 12:30 am (1 hour prior to your exam)   You may take any medications as prescribed with a small amount of water, if necessary. If you take any of the following medications: METFORMIN, GLUCOPHAGE, GLUCOVANCE, AVANDAMET, RIOMET, FORTAMET, [place] MET, JANUMET, GLUMETZA or METAGLIP, you MAY be asked to HOLD this medication 48 hours AFTER the exam.   The purpose of you drinking the oral contrast is to aid in the visualization of your intestinal tract. The contrast solution may cause some diarrhea. Depending on your individual set of symptoms, you may also receive an intravenous injection of x-ray contrast/dye. Plan on being at [facility] for 45 minutes or longer, depending on the type of exam you are having performed.   If you have any questions regarding your exam or if you need to reschedule, you may call [facility] at [phone] between the hours of 8:00 am and 5:00 pm, Monday-Friday.   Continue your Pantoprazole, Famotidine, and Dicyclomine.  START a Low Gas Diet.  Follow up pending the results of your CT.

## 2020-10-31 NOTE — Telephone Encounter (Signed)
Ok per patient to adjust her Labs, New Patient Appt on 11/11 Labs 2:30 pm - Cons 3:00 pm.

## 2020-10-31 NOTE — Progress Notes (Signed)
Subjective:    Patient ID: Meghan Tucker, female    DOB: 12-23-1946, 74 y.o.   MRN: [id number]  HPI Meghan Tucker is a pleasant 74 year old white female, recently established with Dr. Lyndel Safe who comes in today for follow-up due to worsening of her symptoms over the past 2 weeks.  Patient has history of hypertension and hyperlipidemia.  Osteoarthritis, migraines and chronic low back pain.  She has long history of GI issues and has seen multiple gastroenterologists over time.  She had previously been a patient of Dr. Lyda Jester in [place].  She has diagnosis of GERD and IBS. When she was seen in early October she was complaining of ongoing issues with upper abdominal discomfort, nausea, gas and belching.  He was also having intermittent episodes of postprandial diarrhea and urgency.  She had not had any weight loss melena or hematochezia.  She had undergone EGD and colonoscopy in 2018 per Dr. Lyda Jester showing a small hiatal hernia and diverticulosis. She was continued on Protonix 40 mg daily after visit with Dr. Lyndel Safe and added famotidine 20 mg p.o. nightly.  She continues on dicyclomine 10 mg p.o. twice daily and was given a prescription for GI cocktail to use as needed. She has subsequently had upper abdominal ultrasound which was unremarkable with no evidence of gallstones or gallbladder wall thickening and CBD of 3 mm. She says she has been quite ill over the past couple of weeks and has been in bed much of that time with significant weakness lethargy, shakiness nausea without vomiting.  She had not been eating well was having intermittent sweating and chills but no documented fever.  She has been having intermittent right lower quadrant abdominal pain and continues with her chronic belching and gas.  She has seen her PCP a couple of times since onset of the symptoms and has had negative work-up so far, Covid testing negative, no evidence of pneumonia bladder infection etc.  She says as of yesterday she  has been starting to feel better though still complains of weakness.  She has been able to eat the past couple of days and has not had any diarrhea.  She is interested in pursuing further work-up to have an exact diagnosis for all of her GI symptoms.  Review of Systems Pertinent positive and negative review of systems were noted in the above HPI section.  All other review of systems was otherwise negative.  Outpatient Encounter Medications as of 10/31/2020  Medication Sig  . AMBULATORY NON FORMULARY MEDICATION 45 mg every morning. Protonics  . AMBULATORY NON FORMULARY MEDICATION Medication Name: GI Cocktail (equal parts of Viscous Lidocaine, Bentyl, Maalox)  Take 10 cc four times daily as needed.  Marland Kitchen Apoaequorin (PREVAGEN PO) Take 1 tablet by mouth daily.  . Biotin (BIOTIN 5000) 5 MG CAPS Take 5 mg by mouth daily.  Marland Kitchen CALCIUM CITRATE PO Take 1 tablet by mouth every other day.  . celecoxib (CELEBREX) 200 MG capsule Take 200 mg by mouth 2 (two) times daily.  . cetirizine (ZYRTEC) 10 MG tablet Take 10 mg by mouth daily.  . citalopram (CELEXA) 10 MG tablet TAKE 1 TABLET ONCE DAILY  . denosumab (PROLIA) 60 MG/ML SOLN injection Inject 60 mg into the skin every 6 (six) months. Twice a year  . dicyclomine (BENTYL) 10 MG capsule Take 1 capsule (10 mg total) by mouth 2 (two) times daily.  . famotidine (PEPCID) 20 MG tablet Take 1 tablet (20 mg total) by mouth at bedtime.  . hydrOXYzine (ATARAX/VISTARIL)  10 MG tablet Take 10 mg by mouth 2 (two) times daily.  . Inulin (FIBER CHOICE PO) Take by mouth every morning.  . metoprolol succinate (TOPROL-XL) 25 MG 24 hr tablet TAKE 1 TABLET ONCE DAILY.  . pantoprazole (PROTONIX) 40 MG tablet Take 1 tablet (40 mg total) by mouth daily.  . potassium chloride (KLOR-CON) 10 MEQ tablet TAKE 1 TABLET ONCE DAILY.  . pravastatin (PRAVACHOL) 20 MG tablet Take 1 tablet (20 mg total) by mouth daily.  . Probiotic Product (ALIGN PO) Take 1 tablet by mouth daily.   . Psyllium  (METAMUCIL PO) Take by mouth every morning. 3 in 1 fiber  . Vitamin D, Ergocalciferol, (DRISDOL) 1.25 MG (50000 UNIT) CAPS capsule TAKE 1 CAPSULE ONCE A WEEK.  Marland Kitchen zolpidem (AMBIEN CR) 12.5 MG CR tablet Take 12.5 mg by mouth at bedtime.  . [DISCONTINUED] dicyclomine (BENTYL) 10 MG capsule Take 10 mg by mouth 2 (two) times daily.   No facility-administered encounter medications on file as of 10/31/2020.   Allergies  Allergen Reactions  . Atorvastatin Other (See Comments)    MYALGIAS MYALGIAS  . Rosuvastatin Other (See Comments)    MYALGIAS MYALGIAS   Patient Active Problem List   Diagnosis Date Noted  . IBS (irritable bowel syndrome) 10/31/2020  . Spondylolisthesis of lumbar region 10/26/2016  . Nausea alone 10/12/2013  . GERD (gastroesophageal reflux disease) 10/12/2013   Social History   Socioeconomic History  . Marital status: Married    Spouse name: Not on file  . Number of children: Not on file  . Years of education: Not on file  . Highest education level: Not on file  Occupational History  . Not on file  Tobacco Use  . Smoking status: Never Smoker  . Smokeless tobacco: Never Used  Vaping Use  . Vaping Use: Never used  Substance and Sexual Activity  . Alcohol use: Yes    Comment: 1-2 glasses champagne nightly  . Drug use: No  . Sexual activity: Not on file  Other Topics Concern  . Not on file  Social History Narrative  . Not on file   Social Determinants of Health   Financial Resource Strain:   . Difficulty of Paying Living Expenses: Not on file  Food Insecurity:   . Worried About Charity fundraiser in the Last Year: Not on file  . Ran Out of Food in the Last Year: Not on file  Transportation Needs:   . Lack of Transportation (Medical): Not on file  . Lack of Transportation (Non-Medical): Not on file  Physical Activity:   . Days of Exercise per Week: Not on file  . Minutes of Exercise per Session: Not on file  Stress:   . Feeling of Stress : Not on file   Social Connections:   . Frequency of Communication with Friends and Family: Not on file  . Frequency of Social Gatherings with Friends and Family: Not on file  . Attends Religious Services: Not on file  . Active Member of Clubs or Organizations: Not on file  . Attends Archivist Meetings: Not on file  . Marital Status: Not on file  Intimate Partner Violence:   . Fear of Current or Ex-Partner: Not on file  . Emotionally Abused: Not on file  . Physically Abused: Not on file  . Sexually Abused: Not on file    Meghan Tucker's family history includes Heart attack (age of onset: 71) in her father; Heart disease in her father; Stroke (  age of onset: 45) in her mother.      Objective:    Vitals:   10/31/20 0950  BP: 140/82  Pulse: 89    Physical Exam Well-developed well-nourished older WF  in no acute distress.  Height, KGMWNU,272 BMI23.8 HEENT; nontraumatic normocephalic, EOMI, PE RR LA, sclera anicteric. Oropharynx;not examined Neck; supple, no JVD Cardiovascular; regular rate and rhythm with S1-S2, no murmur rub or gallop Pulmonary; Clear bilaterally Abdomen; soft, mild tenderness RLQ  nondistended, no palpable mass or hepatosplenomegaly, bowel sounds are active Rectal;not done Skin; benign exam, no jaundice rash or appreciable lesions Extremities; no clubbing cyanosis or edema skin warm and dry Neuro/Psych; alert and oriented x4, grossly nonfocal mood and affect appropriate       Assessment & Plan:   #7 74 year old white female with long history of chronic GI issues and prior diagnosis of GERD and IBS/D. Last EGD and colonoscopy 2018 pertinent for small hiatal hernia and diverticulosis. Patient has ongoing persistent belching burping and gassiness. Recent onset of intermittent right lower quadrant pain which is new and recent 2-week history of illness with weakness lethargy increase in nausea poor appetite and intermittent sweating and chills. She has seen her PCP  and initial evaluation including chest x-ray UA etc. all unremarkable.  Suspect viral syndrome.  Symptoms improving over the past 48 hours  Regarding ongoing GI symptoms will rule out SIBO, rule out celiac disease, and rule out other intra-abdominal inflammatory process/IBD with further imaging.  #2 anxiety/depression 3.  Hypertension 4.  Hyperlipidemia 5.  Osteoarthritis  Plan; continue omeprazole 40 mg p.o. every morning Continue famotidine 20 mg p.o. every afternoon Continue Bentyl 10 mg p.o. twice daily Start low gas diet, we discussed avoidance of carbonates and artificial sweeteners. Schedule for CT of the abdomen and pelvis with contrast Proceed with hydrogen breath testing for SIBO. BMET and TTG/IgA today. Further recommendations pending results of above, patient asked about repeat endoscopic evaluation which can be considered if above studies all unremarkable.  Norvella Loscalzo Genia Harold PA-C 10/31/2020   Cc: Cher Nakai, MD

## 2020-10-31 NOTE — Telephone Encounter (Signed)
Was there to be a suppository prescribed for this patient?

## 2020-11-01 LAB — IGA: Immunoglobulin A: 197 mg/dL (ref 70–320)

## 2020-11-03 ENCOUNTER — Other Ambulatory Visit: Payer: Self-pay

## 2020-11-03 LAB — TISSUE TRANSGLUTAMINASE, IGA: (tTG) Ab, IgA: <1 U/mL

## 2020-11-03 MED ORDER — HYDROCORTISONE ACETATE 25 MG RE SUPP: RECTAL | 3 refills | Status: DC

## 2020-11-03 NOTE — Telephone Encounter (Signed)
Yes, was suppose to have Anusol HC supp qhs x 5-7 days prn hemorrhoidal sxs - 1 box/3 refills

## 2020-11-04 ENCOUNTER — Other Ambulatory Visit: Payer: Self-pay

## 2020-11-04 ENCOUNTER — Ambulatory Visit (HOSPITAL_BASED_OUTPATIENT_CLINIC_OR_DEPARTMENT_OTHER)
Admission: RE | Admit: 2020-11-04 | Discharge: 2020-11-04 | Disposition: A | Discharge status: Home / Self Care | Payer: Medicare Other | Source: Ambulatory Visit | Attending: Physician Assistant | Admitting: Physician Assistant

## 2020-11-04 DIAGNOSIS — R1031 Right lower quadrant pain: Secondary | ICD-10-CM

## 2020-11-04 DIAGNOSIS — K589 Irritable bowel syndrome without diarrhea: Secondary | ICD-10-CM | POA: Diagnosis not present

## 2020-11-04 DIAGNOSIS — K219 Gastro-esophageal reflux disease without esophagitis: Secondary | ICD-10-CM

## 2020-11-04 DIAGNOSIS — R142 Eructation: Secondary | ICD-10-CM | POA: Diagnosis not present

## 2020-11-04 DIAGNOSIS — R109 Unspecified abdominal pain: Secondary | ICD-10-CM | POA: Diagnosis not present

## 2020-11-04 MED ORDER — IOHEXOL 300 MG/ML  SOLN
100.0000 mL | Freq: Once | INTRAMUSCULAR | Status: AC | PRN
  Administered 2020-11-04: 100 mL via INTRAVENOUS

## 2020-11-05 ENCOUNTER — Other Ambulatory Visit: Payer: Self-pay | Admitting: Oncology

## 2020-11-05 ENCOUNTER — Other Ambulatory Visit: Payer: Self-pay | Admitting: Physician Assistant

## 2020-11-05 DIAGNOSIS — R233 Spontaneous ecchymoses: Secondary | ICD-10-CM

## 2020-11-05 NOTE — Progress Notes (Addendum)
[facility]  [address] [place],  [place]  [address] [phone]  Clinic Day:  11/06/2020  Referring physician: Cher Nakai, MD   HISTORY OF PRESENT ILLNESS:  The patient is a 74 y.o. female  who I was asked to consult upon for easy bruising.  She complains of having bruises over her forearms and bilateral lower legs.  However, she denies having bleeding/bruising issues elsewhere with her body.  She also denies having problems with epistaxis or gingival bleeding.  Of note, this patient was seen by me 9 years ago for similar problems.  At that time, an extensive workup, including screening for von Willebrand's disease and coagulopathies, all came back negative.  The patient does take Celebrex for arthritis, but denies being on any other type of blood thinner.  She recalls her mother having similar issues with extremity bruising.  Otherwise, there is no family history of any hematologic disorders.  The patient has also had surgeries in the past that were never complicated by postoperative bleeding.  PAST MEDICAL HISTORY:   Past Medical History:  Diagnosis Date  . Arthritis    "all over"  . Chronic back pain    2 buldging disc,stenosis  . Diverticulosis   . GERD (gastroesophageal reflux disease)    takes Omeprazole daily  . History of hiatal hernia   . History of kidney stones   . History of kidney stones   . History of migraine    none since menopause yrs ago  . Hyperlipidemia    takes Fish Oil daily  . Hypertension    takes Metoprolol daily  . Insomnia    takes Ambien nightly  . Sciatica   . Sciatica   . Tingling    right arm.Both feet   Depression Seasonal allergies Osteoporosis  PAST SURGICAL HISTORY:   Past Surgical History:  Procedure Laterality Date  . CARPAL TUNNEL RELEASE Left   . cataract surgery Bilateral   . COLONOSCOPY     Dr. Jerilynn Mages in [place]  . ESOPHAGOGASTRODUODENOSCOPY     Dr Jerilynn Mages in [place]  .  ESOPHAGOGASTRODUODENOSCOPY (EGD) WITH ESOPHAGEAL DILATION  05/14/2015   with Propofol  . ESOPHAGOGASTRODUODENOSCOPY (EGD) WITH PROPOFOL  10/19/2013  . GANGLION CYST EXCISION    . HAMMERTOE RECONSTRUCTION WITH WEIL OSTEOTOMY Left 07/17/2015   Procedure: LEFT 2-3 WEIL OSTEOTOMY AND HAMMERTOE CORRECTION ;  Surgeon: [name], MD;  Location: [facility];  Service: Orthopedics;  Laterality: Left;  Back surgery  CURRENT MEDICATIONS:   Current Outpatient Medications  Medication Sig Dispense Refill  . AMBULATORY NON FORMULARY MEDICATION 45 mg every morning. Protonics    . AMBULATORY NON FORMULARY MEDICATION Medication Name: GI Cocktail (equal parts of Viscous Lidocaine, Bentyl, Maalox)  Take 10 cc four times daily as needed. 120 mL 1  . Apoaequorin (PREVAGEN PO) Take 1 tablet by mouth daily.    . Biotin (BIOTIN 5000) 5 MG CAPS Take 5 mg by mouth daily.    Marland Kitchen CALCIUM CITRATE PO Take 1 tablet by mouth every other day.    . celecoxib (CELEBREX) 200 MG capsule Take 200 mg by mouth 2 (two) times daily.    . cetirizine (ZYRTEC) 10 MG tablet Take 10 mg by mouth daily.    . citalopram (CELEXA) 10 MG tablet TAKE 1 TABLET ONCE DAILY 90 tablet 3  . denosumab (PROLIA) 60 MG/ML SOLN injection Inject 60 mg into the skin every 6 (six) months. Twice a year    .  dicyclomine (BENTYL) 10 MG capsule Take 1 capsule (10 mg total) by mouth 2 (two) times daily. 60 capsule 3  . famotidine (PEPCID) 20 MG tablet Take 1 tablet (20 mg total) by mouth at bedtime. 30 tablet 11  . hydrocortisone (ANUSOL-HC) 25 MG suppository Use for 5 to 7 days 12 suppository 3  . hydrOXYzine (ATARAX/VISTARIL) 10 MG tablet Take 10 mg by mouth 2 (two) times daily.    . Inulin (FIBER CHOICE PO) Take by mouth every morning.    . metoprolol succinate (TOPROL-XL) 25 MG 24 hr tablet TAKE 1 TABLET ONCE DAILY. 90 tablet 1  . pantoprazole (PROTONIX) 40 MG tablet Take 1 tablet (40 mg total) by mouth daily. 30 tablet 11  . potassium chloride  (KLOR-CON) 10 MEQ tablet TAKE 1 TABLET ONCE DAILY. 90 tablet 1  . pravastatin (PRAVACHOL) 20 MG tablet Take 1 tablet (20 mg total) by mouth daily. 30 tablet 2  . Probiotic Product (ALIGN PO) Take 1 tablet by mouth daily.     . Psyllium (METAMUCIL PO) Take by mouth every morning. 3 in 1 fiber    . Vitamin D, Ergocalciferol, (DRISDOL) 1.25 MG (50000 UNIT) CAPS capsule TAKE 1 CAPSULE ONCE A WEEK. 12 capsule 0  . zolpidem (AMBIEN CR) 12.5 MG CR tablet Take 12.5 mg by mouth at bedtime.     No current facility-administered medications for this visit.    ALLERGIES:   Allergies  Allergen Reactions  . Atorvastatin Other (See Comments)    MYALGIAS MYALGIAS  . Rosuvastatin Other (See Comments)    MYALGIAS MYALGIAS    FAMILY HISTORY:   Family History  Problem Relation Age of Onset  . Heart disease Father   . Heart attack Father 84  . Stroke Mother 57  . Colon cancer Neg Hx   . Esophageal cancer Neg Hx   . Cancer Neg Hx   She has 2 brothers who are healthy  SOCIAL HISTORY:   The patient was born and raised in [place].  She currently lives in town with her husband of 25 years.  They have 2 children and 5 grandchildren.  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  There is no significant smoking history.  She drinks champagne at night.  REVIEW OF SYSTEMS:  Review of Systems  Constitutional: Positive for unexpected weight change (15 lb wt loss - mostly intentional). Negative for fatigue and fever.  HENT:   Positive for hearing loss. Negative for sore throat.   Eyes: Negative for eye problems.  Respiratory: Positive for cough (attributes this to her GERD). Negative for chest tightness and hemoptysis.   Cardiovascular: Negative for chest pain and palpitations.  Gastrointestinal: Negative for abdominal distention, abdominal pain, blood in stool, constipation, diarrhea, nausea and vomiting.  Endocrine: Negative for hot flashes.  Genitourinary:  Negative for difficulty urinating, dysuria, frequency, hematuria and nocturia.   Musculoskeletal: Positive for arthralgias. Negative for back pain, gait problem and myalgias.  Skin: Positive for rash (extremity bruising). Negative for itching.  Neurological: Negative.  Negative for dizziness, extremity weakness, gait problem, headaches, light-headedness and numbness.  Hematological: Negative.   Psychiatric/Behavioral: Positive for depression. Negative for suicidal ideas. The patient is not nervous/anxious.   Decreased energy level  PHYSICAL EXAM:  Blood pressure (!) 160/77, pulse 68, temperature 98.4 F (36.9 C), temperature source Oral, resp. rate 14, height 5' 0.5" (1.537 m), weight 125 lb 1.6 oz (56.7 kg), SpO2 97 %. Wt Readings from Last 3  Encounters:  11/06/20 125 lb 1.6 oz (56.7 kg)  10/31/20 124 lb (56.2 kg)  10/02/20 130 lb (59 kg)   Body mass index is 24.03 kg/m. Performance status (ECOG): 0 GENERAL: The patient is alert and oriented x3, in no acute distress. HEENT EXAM: Clear oropharynx with no exudate or lesions appreciated. LUNG EXAM: Clear to auscultation bilaterally. CARDIAC EXAM: Regular rate and rhythm. No murmurs, rubs, or gallops. ABDOMINAL EXAM: Soft, nontender and nondistended; no hepatosplenomegaly; normal abdominal bowel sounds EXTREMITY EXAM: No clubbing, cyanosis, or edema. LYMPH NODE SURVEY: No palpable cervical, supraclavicular, axillary, or inguinal lymphadenopathy. NEUROLOGIC EXAM: Cranial nerves II-XII and cerebellar functions are grossly intact. SKIN EXAM: Areas of ecchymoses are appreciated over her forearms and lower legs.  Veins are clearly visible coursing through her skin, which appears thin and friable.   LABS:      Ref. Range 11/06/2020 14:16  Prothrombin Time Latest Ref Range: 11.4 - 15.2 seconds 11.4  INR Latest Ref Range: 0.8 - 1.2  0.9  APTT Latest Ref Range: 24 - 36 seconds 25     ASSESSMENT & PLAN:  A 74 y.o. female who I was asked to  consult upon for easy bruising.  In clinic today, I reviewed current and previous labs with her, neither of which showed any evidence of an underlying bleeding disorder being present.  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  As there is nothing per her history, labs, or physical exam to suggest an underlying hematologic disorder is present, I do feel comfortable turning her care back over to her primary care office. I would not have a problem seeing her in the future if new hematologic issues arise that require repeat clinical assessment.  The patient understands all the plans discussed today and is in agreement with them.  I do appreciate Dr Cher Nakai for this new consult.   Yaqub Arney Macarthur Critchley, MD

## 2020-11-06 ENCOUNTER — Other Ambulatory Visit: Payer: Self-pay

## 2020-11-06 ENCOUNTER — Inpatient Hospital Stay: Payer: Medicare Other | Admitting: Oncology

## 2020-11-06 ENCOUNTER — Inpatient Hospital Stay (INDEPENDENT_AMBULATORY_CARE_PROVIDER_SITE_OTHER): Payer: Medicare Other | Admitting: Oncology

## 2020-11-06 ENCOUNTER — Encounter: Payer: Self-pay | Admitting: Oncology

## 2020-11-06 ENCOUNTER — Inpatient Hospital Stay: Payer: Medicare Other

## 2020-11-06 ENCOUNTER — Other Ambulatory Visit: Payer: Self-pay | Admitting: Hematology and Oncology

## 2020-11-06 ENCOUNTER — Inpatient Hospital Stay: Payer: Medicare Other | Attending: Oncology

## 2020-11-06 VITALS — BP 160/77 | HR 68 | Temp 98.4°F | Resp 14 | Ht 60.5 in | Wt 125.1 lb

## 2020-11-06 DIAGNOSIS — D649 Anemia, unspecified: Secondary | ICD-10-CM | POA: Diagnosis not present

## 2020-11-06 DIAGNOSIS — R233 Spontaneous ecchymoses: Secondary | ICD-10-CM | POA: Diagnosis not present

## 2020-11-06 DIAGNOSIS — Z0001 Encounter for general adult medical examination with abnormal findings: Secondary | ICD-10-CM | POA: Diagnosis not present

## 2020-11-06 LAB — CBC AND DIFFERENTIAL
HCT: 42 (ref 36–46)
Hemoglobin: 14.1 (ref 12.0–16.0)
Neutrophils Absolute: 6.6
Platelets: 244 (ref 150–399)
WBC: 8.8

## 2020-11-06 LAB — BASIC METABOLIC PANEL
BUN: 18 (ref 4–21)
CO2: 26 — AB (ref 13–22)
Chloride: 107 (ref 99–108)
Creatinine: 1 (ref 0.5–1.1)
Glucose: 96
Potassium: 4.7 (ref 3.4–5.3)
Sodium: 139 (ref 137–147)

## 2020-11-06 LAB — PROTIME-INR
INR: 0.9 (ref 0.8–1.2)
Prothrombin Time: 11.4 seconds (ref 11.4–15.2)

## 2020-11-06 LAB — APTT: aPTT: 25 seconds (ref 24–36)

## 2020-11-06 LAB — HEPATIC FUNCTION PANEL
ALT: 28 (ref 7–35)
AST: 26 (ref 13–35)
Alkaline Phosphatase: 71 (ref 25–125)
Bilirubin, Total: 1

## 2020-11-06 LAB — CBC: RBC: 4.06 (ref 3.87–5.11)

## 2020-11-06 LAB — COMPREHENSIVE METABOLIC PANEL
Albumin: 3.9 (ref 3.5–5.0)
Calcium: 9.9 (ref 8.7–10.7)

## 2020-11-07 ENCOUNTER — Telehealth: Payer: Self-pay | Admitting: Oncology

## 2020-11-07 NOTE — Telephone Encounter (Signed)
Per 11/11 LOS.nO FUTURE APPTS ENTERED

## 2020-11-09 NOTE — Progress Notes (Signed)
Agree with the plan. CT was negative for any acute abnormalities I have discussed it over the phone with her She feels better RG

## 2020-11-10 ENCOUNTER — Ambulatory Visit: Payer: Medicare Other | Admitting: Oncology

## 2020-11-10 DIAGNOSIS — R251 Tremor, unspecified: Secondary | ICD-10-CM | POA: Diagnosis not present

## 2020-11-10 DIAGNOSIS — K219 Gastro-esophageal reflux disease without esophagitis: Secondary | ICD-10-CM | POA: Diagnosis not present

## 2020-11-10 DIAGNOSIS — J309 Allergic rhinitis, unspecified: Secondary | ICD-10-CM | POA: Diagnosis not present

## 2020-11-10 DIAGNOSIS — R0600 Dyspnea, unspecified: Secondary | ICD-10-CM | POA: Diagnosis not present

## 2020-11-10 DIAGNOSIS — E663 Overweight: Secondary | ICD-10-CM | POA: Diagnosis not present

## 2020-11-10 DIAGNOSIS — M545 Low back pain, unspecified: Secondary | ICD-10-CM | POA: Diagnosis not present

## 2020-11-10 DIAGNOSIS — G4700 Insomnia, unspecified: Secondary | ICD-10-CM | POA: Diagnosis not present

## 2020-11-10 DIAGNOSIS — E7800 Pure hypercholesterolemia, unspecified: Secondary | ICD-10-CM | POA: Diagnosis not present

## 2020-11-10 DIAGNOSIS — F3341 Major depressive disorder, recurrent, in partial remission: Secondary | ICD-10-CM | POA: Diagnosis not present

## 2020-11-10 DIAGNOSIS — Z6823 Body mass index (BMI) 23.0-23.9, adult: Secondary | ICD-10-CM | POA: Diagnosis not present

## 2020-11-10 DIAGNOSIS — K589 Irritable bowel syndrome without diarrhea: Secondary | ICD-10-CM | POA: Diagnosis not present

## 2020-11-10 DIAGNOSIS — M159 Polyosteoarthritis, unspecified: Secondary | ICD-10-CM | POA: Diagnosis not present

## 2020-11-11 DIAGNOSIS — M545 Low back pain, unspecified: Secondary | ICD-10-CM | POA: Diagnosis not present

## 2020-11-11 DIAGNOSIS — K589 Irritable bowel syndrome without diarrhea: Secondary | ICD-10-CM | POA: Diagnosis not present

## 2020-11-11 DIAGNOSIS — R7989 Other specified abnormal findings of blood chemistry: Secondary | ICD-10-CM | POA: Diagnosis not present

## 2020-11-11 DIAGNOSIS — F3341 Major depressive disorder, recurrent, in partial remission: Secondary | ICD-10-CM | POA: Diagnosis not present

## 2020-11-11 DIAGNOSIS — E7800 Pure hypercholesterolemia, unspecified: Secondary | ICD-10-CM | POA: Diagnosis not present

## 2020-11-11 DIAGNOSIS — E538 Deficiency of other specified B group vitamins: Secondary | ICD-10-CM | POA: Diagnosis not present

## 2020-11-11 DIAGNOSIS — J309 Allergic rhinitis, unspecified: Secondary | ICD-10-CM | POA: Diagnosis not present

## 2020-11-11 DIAGNOSIS — M159 Polyosteoarthritis, unspecified: Secondary | ICD-10-CM | POA: Diagnosis not present

## 2020-11-11 DIAGNOSIS — R0600 Dyspnea, unspecified: Secondary | ICD-10-CM | POA: Diagnosis not present

## 2020-11-11 DIAGNOSIS — R251 Tremor, unspecified: Secondary | ICD-10-CM | POA: Diagnosis not present

## 2020-11-11 DIAGNOSIS — G4700 Insomnia, unspecified: Secondary | ICD-10-CM | POA: Diagnosis not present

## 2020-11-11 DIAGNOSIS — K219 Gastro-esophageal reflux disease without esophagitis: Secondary | ICD-10-CM | POA: Diagnosis not present

## 2020-11-13 DIAGNOSIS — R251 Tremor, unspecified: Secondary | ICD-10-CM | POA: Diagnosis not present

## 2020-11-13 DIAGNOSIS — M545 Low back pain, unspecified: Secondary | ICD-10-CM | POA: Diagnosis not present

## 2020-11-13 DIAGNOSIS — J309 Allergic rhinitis, unspecified: Secondary | ICD-10-CM | POA: Diagnosis not present

## 2020-11-13 DIAGNOSIS — K589 Irritable bowel syndrome without diarrhea: Secondary | ICD-10-CM | POA: Diagnosis not present

## 2020-11-13 DIAGNOSIS — E7800 Pure hypercholesterolemia, unspecified: Secondary | ICD-10-CM | POA: Diagnosis not present

## 2020-11-13 DIAGNOSIS — F3341 Major depressive disorder, recurrent, in partial remission: Secondary | ICD-10-CM | POA: Diagnosis not present

## 2020-11-13 DIAGNOSIS — K219 Gastro-esophageal reflux disease without esophagitis: Secondary | ICD-10-CM | POA: Diagnosis not present

## 2020-11-13 DIAGNOSIS — R5382 Chronic fatigue, unspecified: Secondary | ICD-10-CM | POA: Diagnosis not present

## 2020-11-13 DIAGNOSIS — R0600 Dyspnea, unspecified: Secondary | ICD-10-CM | POA: Diagnosis not present

## 2020-11-13 DIAGNOSIS — M2550 Pain in unspecified joint: Secondary | ICD-10-CM | POA: Diagnosis not present

## 2020-11-13 DIAGNOSIS — R7989 Other specified abnormal findings of blood chemistry: Secondary | ICD-10-CM | POA: Diagnosis not present

## 2020-11-13 DIAGNOSIS — M6281 Muscle weakness (generalized): Secondary | ICD-10-CM | POA: Diagnosis not present

## 2020-11-18 DIAGNOSIS — R5382 Chronic fatigue, unspecified: Secondary | ICD-10-CM | POA: Diagnosis not present

## 2020-11-18 DIAGNOSIS — R251 Tremor, unspecified: Secondary | ICD-10-CM | POA: Diagnosis not present

## 2020-11-18 DIAGNOSIS — F3341 Major depressive disorder, recurrent, in partial remission: Secondary | ICD-10-CM | POA: Diagnosis not present

## 2020-11-18 DIAGNOSIS — E7800 Pure hypercholesterolemia, unspecified: Secondary | ICD-10-CM | POA: Diagnosis not present

## 2020-11-18 DIAGNOSIS — M2550 Pain in unspecified joint: Secondary | ICD-10-CM | POA: Diagnosis not present

## 2020-11-18 DIAGNOSIS — K219 Gastro-esophageal reflux disease without esophagitis: Secondary | ICD-10-CM | POA: Diagnosis not present

## 2020-11-18 DIAGNOSIS — J309 Allergic rhinitis, unspecified: Secondary | ICD-10-CM | POA: Diagnosis not present

## 2020-11-18 DIAGNOSIS — M545 Low back pain, unspecified: Secondary | ICD-10-CM | POA: Diagnosis not present

## 2020-11-18 DIAGNOSIS — R7989 Other specified abnormal findings of blood chemistry: Secondary | ICD-10-CM | POA: Diagnosis not present

## 2020-11-18 DIAGNOSIS — E663 Overweight: Secondary | ICD-10-CM | POA: Diagnosis not present

## 2020-11-18 DIAGNOSIS — R0600 Dyspnea, unspecified: Secondary | ICD-10-CM | POA: Diagnosis not present

## 2020-11-18 DIAGNOSIS — K589 Irritable bowel syndrome without diarrhea: Secondary | ICD-10-CM | POA: Diagnosis not present

## 2020-11-19 ENCOUNTER — Telehealth: Payer: Self-pay | Admitting: Internal Medicine

## 2020-11-19 DIAGNOSIS — M5430 Sciatica, unspecified side: Secondary | ICD-10-CM | POA: Insufficient documentation

## 2020-11-19 DIAGNOSIS — M1990 Unspecified osteoarthritis, unspecified site: Secondary | ICD-10-CM | POA: Insufficient documentation

## 2020-11-19 DIAGNOSIS — K5790 Diverticulosis of intestine, part unspecified, without perforation or abscess without bleeding: Secondary | ICD-10-CM | POA: Insufficient documentation

## 2020-11-19 DIAGNOSIS — Z87442 Personal history of urinary calculi: Secondary | ICD-10-CM | POA: Insufficient documentation

## 2020-11-19 DIAGNOSIS — Z8669 Personal history of other diseases of the nervous system and sense organs: Secondary | ICD-10-CM | POA: Insufficient documentation

## 2020-11-19 DIAGNOSIS — R202 Paresthesia of skin: Secondary | ICD-10-CM | POA: Insufficient documentation

## 2020-11-19 DIAGNOSIS — G8929 Other chronic pain: Secondary | ICD-10-CM | POA: Insufficient documentation

## 2020-11-19 DIAGNOSIS — G4700 Insomnia, unspecified: Secondary | ICD-10-CM | POA: Insufficient documentation

## 2020-11-19 DIAGNOSIS — I10 Essential (primary) hypertension: Secondary | ICD-10-CM | POA: Insufficient documentation

## 2020-11-19 DIAGNOSIS — E785 Hyperlipidemia, unspecified: Secondary | ICD-10-CM | POA: Insufficient documentation

## 2020-11-19 DIAGNOSIS — M549 Dorsalgia, unspecified: Secondary | ICD-10-CM | POA: Insufficient documentation

## 2020-11-19 DIAGNOSIS — Z8719 Personal history of other diseases of the digestive system: Secondary | ICD-10-CM | POA: Insufficient documentation

## 2020-11-19 NOTE — Telephone Encounter (Deleted)
-----   Message from Arloa Koh sent at 11/19/2020  2:31 PM EST ----- Regarding: Peer to Peer Discussion TODAY Dr. Truman Hayward is requesting a peer to peer discussion concerning this patient.. today at 4:00 if possible or whenever you can. Dr Truman Hayward [phone]. Ask staff to get [facility] when you call.

## 2020-11-19 NOTE — Telephone Encounter (Addendum)
-----   Message from Arloa Koh sent at 11/19/2020  2:31 PM EST ----- Regarding: Peer to Peer Discussion TODAY Dr. Truman Hayward is requesting a peer to peer discussion concerning this patient.. today at 4:00 if possible or whenever you can. Dr Truman Hayward [phone]. Ask staff to get [facility] when you call.    Discussed case with Dr. Truman Hayward on 11/19/2020 at 1645   Pt with high clinical suspicion for AI. She did get kenalog injection 11/16 /2021 n Cortisol level low 0.6 on 11/18  ACTH undetectable on 23rd - consistent with secondary adrenal insufficiency but serum cortisol at 9    Given high clinical suspicion for adrenal insufficiency  I suggest starting HC until further evaluation    HC 10 mg QAM  HC 5 mg Q pm between 2-4 pm    Pt has a f/u with me in 12/2019   Mack Guise, MD  [facility]  [facility] [address] [place], [place] [address] Phone: [place]: [phone]    .u

## 2020-11-21 DIAGNOSIS — E663 Overweight: Secondary | ICD-10-CM | POA: Diagnosis not present

## 2020-11-21 DIAGNOSIS — K219 Gastro-esophageal reflux disease without esophagitis: Secondary | ICD-10-CM | POA: Diagnosis not present

## 2020-11-21 DIAGNOSIS — J309 Allergic rhinitis, unspecified: Secondary | ICD-10-CM | POA: Diagnosis not present

## 2020-11-21 DIAGNOSIS — M2550 Pain in unspecified joint: Secondary | ICD-10-CM | POA: Diagnosis not present

## 2020-11-21 DIAGNOSIS — R251 Tremor, unspecified: Secondary | ICD-10-CM | POA: Diagnosis not present

## 2020-11-21 DIAGNOSIS — E2740 Unspecified adrenocortical insufficiency: Secondary | ICD-10-CM | POA: Diagnosis not present

## 2020-11-21 DIAGNOSIS — M545 Low back pain, unspecified: Secondary | ICD-10-CM | POA: Diagnosis not present

## 2020-11-21 DIAGNOSIS — R0600 Dyspnea, unspecified: Secondary | ICD-10-CM | POA: Diagnosis not present

## 2020-11-21 DIAGNOSIS — F3341 Major depressive disorder, recurrent, in partial remission: Secondary | ICD-10-CM | POA: Diagnosis not present

## 2020-11-21 DIAGNOSIS — K589 Irritable bowel syndrome without diarrhea: Secondary | ICD-10-CM | POA: Diagnosis not present

## 2020-11-21 DIAGNOSIS — R7989 Other specified abnormal findings of blood chemistry: Secondary | ICD-10-CM | POA: Diagnosis not present

## 2020-11-21 DIAGNOSIS — E7800 Pure hypercholesterolemia, unspecified: Secondary | ICD-10-CM | POA: Diagnosis not present

## 2020-11-26 DIAGNOSIS — B9689 Other specified bacterial agents as the cause of diseases classified elsewhere: Secondary | ICD-10-CM | POA: Diagnosis not present

## 2020-11-26 DIAGNOSIS — R7989 Other specified abnormal findings of blood chemistry: Secondary | ICD-10-CM | POA: Diagnosis not present

## 2020-11-26 DIAGNOSIS — K219 Gastro-esophageal reflux disease without esophagitis: Secondary | ICD-10-CM | POA: Diagnosis not present

## 2020-11-26 DIAGNOSIS — R0600 Dyspnea, unspecified: Secondary | ICD-10-CM | POA: Diagnosis not present

## 2020-11-26 DIAGNOSIS — J208 Acute bronchitis due to other specified organisms: Secondary | ICD-10-CM | POA: Diagnosis not present

## 2020-11-26 DIAGNOSIS — R251 Tremor, unspecified: Secondary | ICD-10-CM | POA: Diagnosis not present

## 2020-11-26 DIAGNOSIS — K589 Irritable bowel syndrome without diarrhea: Secondary | ICD-10-CM | POA: Diagnosis not present

## 2020-11-26 DIAGNOSIS — E2740 Unspecified adrenocortical insufficiency: Secondary | ICD-10-CM | POA: Diagnosis not present

## 2020-11-26 DIAGNOSIS — F3341 Major depressive disorder, recurrent, in partial remission: Secondary | ICD-10-CM | POA: Diagnosis not present

## 2020-11-26 DIAGNOSIS — E7800 Pure hypercholesterolemia, unspecified: Secondary | ICD-10-CM | POA: Diagnosis not present

## 2020-11-26 DIAGNOSIS — J309 Allergic rhinitis, unspecified: Secondary | ICD-10-CM | POA: Diagnosis not present

## 2020-11-26 DIAGNOSIS — M545 Low back pain, unspecified: Secondary | ICD-10-CM | POA: Diagnosis not present

## 2020-11-30 NOTE — Progress Notes (Signed)
Cardiology Office Note:    Date:  12/01/2020   ID:  Meghan Tucker, DOB September 05, 1946, MRN [id number]  PCP:  Cher Nakai, MD  Cardiologist:  Shirlee More, MD   Referring MD: Cher Nakai, MD  ASSESSMENT:    1. Shortness of breath   2. Palpitations   3. Essential hypertension   4. Pure hypercholesterolemia   5. Adrenal insufficiency ([place])    PLAN:    In order of problems listed above:  1. Is a very complex case her symptoms of shortness of breath are associated with palpitation not exertional not anginal in nature.  Will use a 7-day ZIO monitor to assess for arrhythmia. 2. Stable blood pressure target continue her long-term low-dose beta-blocker 3. 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  For risk assessment cardiac CT calcium score.  If very high I would favor CT angiography. 4. Awaiting endocrinology evaluation  Next appointment 6 weeks   Medication Adjustments/Labs and Tests Ordered: Current medicines are reviewed at length with the patient today.  Concerns regarding medicines are outlined above.  Orders Placed This Encounter  Procedures  . LONG TERM MONITOR (3-14 DAYS)  . EKG 12-Lead  . ECHOCARDIOGRAM COMPLETE   Meds ordered this encounter  Medications  . rosuvastatin (CRESTOR) 10 MG tablet    Sig: Take 1 tablet (10 mg total) by mouth daily.    Dispense:  90 tablet    Refill:  3     Chief Complaint  Patient presents with  . Shortness of Breath    History of Present Illness:    Meghan Tucker is a 74 y.o. female with hypertension and hyper lipidemia who is being seen today for the evaluation of shortness of breath at the request of Cher Nakai, MD.  Chart review: She was recently evaluated by Dr. Bobby Rumpf hematology for ease of bruising  11/06/2020.  He notes she had a similar evaluation 9-year prior with no evidence of bleeding disorder.  Laboratory test 11/06/2020 shows a hemoglobin of 14.1 increased MCV platelet count 244,000 her INR pro time and PTT were normal was diagnosed with senile purpura.  She was seen by GI Dr. Lyndel Safe 10/31/2020 with a history of GERD and irritable bowel syndrome with diarrhea.  She was seen in cardiology consultation by Dr. Elease Hashimoto 08/22/2014 [facility] for dyslipidemia recommended resuming a low-dose of a statin and did not feel that she can required a cardiac ischemia evaluation.  Her EKG at that visit was described as sinus rhythm with nonspecific T wave changes She had CT of the abdomen and pelvis 11/04/2020 which was read as unremarkable, reviewing the vascular findings there is atherosclerosis in the abdominal aorta. She had a chest x-ray at [facility] 10/30/2020 read as normal. Ultrasound abdomen 10/13/2020 read as unremarkable no pathology  Recent labs from epic 07/08/2020 CMP showed a GFR 64 cc potassium 4.3 creatinine 0.9 glucose normal 85 and normal liver function test  She is seen by me today in combination to recent illness over the last few months.  The hallmark of it is overwhelming fatigue.  She had an evaluation was found to be adrenal insufficient and has arrangements to see endocrinology next month.  She has been placed on replacement therapy feels a little better but still has not  recovered.  In the context of this at times she has palpitation and breathlessness but no exertional chest pain shortness of breath or syncope.  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  She relates she had a stress test in the range of 10 to 20 years ago which was normal.  There is no known vascular  disease congenital rheumatic heart disease or heart murmur. Past Medical History:  Diagnosis Date  . Arthritis    "all over"  . Chronic back pain    2 buldging disc,stenosis  . Diverticulosis   . GERD (gastroesophageal reflux disease)    takes Omeprazole daily  . History of hiatal hernia   . History of kidney stones   . History of kidney stones   . History of migraine    none since menopause yrs ago  . Hyperlipidemia    takes Fish Oil daily  . Hypertension    takes Metoprolol daily  . Insomnia    takes Ambien nightly  . Sciatica   . Sciatica   . Tingling    right arm.Both feet     Past Surgical History:  Procedure Laterality Date  . CARPAL TUNNEL RELEASE Left   . cataract surgery Bilateral   . COLONOSCOPY     Dr. Jerilynn Mages in [place]  . ESOPHAGOGASTRODUODENOSCOPY     Dr Jerilynn Mages in [place]  . ESOPHAGOGASTRODUODENOSCOPY (EGD) WITH ESOPHAGEAL DILATION  05/14/2015   with Propofol  . ESOPHAGOGASTRODUODENOSCOPY (EGD) WITH PROPOFOL  10/19/2013  . GANGLION CYST EXCISION    . HAMMERTOE RECONSTRUCTION WITH WEIL OSTEOTOMY Left 07/17/2015   Procedure: LEFT 2-3 WEIL OSTEOTOMY AND HAMMERTOE CORRECTION ;  Surgeon: [name], MD;  Location: [place];  Service: Orthopedics;  Laterality: Left;    Current Medications: Current Meds  Medication Sig  . AMBULATORY NON FORMULARY MEDICATION 45 mg every morning. Protonics  . Apoaequorin (PREVAGEN PO) Take 1 tablet by mouth daily.  . Biotin (BIOTIN 5000) 5 MG CAPS Take 5 mg by mouth daily.  Marland Kitchen CALCIUM CITRATE PO Take 1 tablet by mouth every other day.  . celecoxib (CELEBREX) 200 MG capsule Take 200 mg by mouth 2 (two) times daily.  . cetirizine (ZYRTEC) 10 MG tablet Take 10 mg by mouth daily.  . citalopram (CELEXA) 10 MG tablet TAKE 1 TABLET ONCE DAILY  . denosumab (PROLIA) 60 MG/ML SOLN injection Inject 60 mg into the skin every 6 (six) months. Twice a year  . dicyclomine (BENTYL) 10 MG capsule Take 1 capsule (10 mg total) by mouth 2  (two) times daily.  . famotidine (PEPCID) 20 MG tablet Take 1 tablet (20 mg total) by mouth at bedtime.  . hydrOXYzine (ATARAX/VISTARIL) 10 MG tablet Take 10 mg by mouth 2 (two) times daily.  . Inulin (FIBER CHOICE PO) Take by mouth every morning.  . metoprolol succinate (TOPROL-XL) 25 MG 24 hr tablet TAKE 1 TABLET ONCE DAILY.  . pantoprazole (PROTONIX) 40 MG tablet Take 1 tablet (40 mg total) by mouth daily.  . potassium chloride (KLOR-CON) 10 MEQ tablet TAKE 1 TABLET ONCE DAILY.  . Probiotic Product (ALIGN PO) Take 1 tablet by mouth daily.   . Psyllium (METAMUCIL PO) Take by mouth every morning. 3 in 1 fiber  . Vitamin D, Ergocalciferol, (DRISDOL) 1.25 MG (50000 UNIT) CAPS capsule TAKE 1  CAPSULE ONCE A WEEK.  Marland Kitchen zolpidem (AMBIEN CR) 12.5 MG CR tablet Take 12.5 mg by mouth at bedtime.  . [DISCONTINUED] pravastatin (PRAVACHOL) 20 MG tablet Take 1 tablet (20 mg total) by mouth daily.     Allergies:   Atorvastatin and Rosuvastatin   Social History   Socioeconomic History  . Marital status: Married    Spouse name: Not on file  . Number of children: Not on file  . Years of education: Not on file  . Highest education level: Not on file  Occupational History  . Not on file  Tobacco Use  . Smoking status: Never Smoker  . Smokeless tobacco: Never Used  Vaping Use  . Vaping Use: Never used  Substance and Sexual Activity  . Alcohol use: Yes    Comment: 1-2 glasses champagne nightly  . Drug use: No  . Sexual activity: Not on file  Other Topics Concern  . Not on file  Social History Narrative  . Not on file   Social Determinants of Health   Financial Resource Strain:   . Difficulty of Paying Living Expenses: Not on file  Food Insecurity:   . Worried About Charity fundraiser in the Last Year: Not on file  . Ran Out of Food in the Last Year: Not on file  Transportation Needs:   . Lack of Transportation (Medical): Not on file  . Lack of Transportation (Non-Medical): Not on file   Physical Activity:   . Days of Exercise per Week: Not on file  . Minutes of Exercise per Session: Not on file  Stress:   . Feeling of Stress : Not on file  Social Connections:   . Frequency of Communication with Friends and Family: Not on file  . Frequency of Social Gatherings with Friends and Family: Not on file  . Attends Religious Services: Not on file  . Active Member of Clubs or Organizations: Not on file  . Attends Archivist Meetings: Not on file  . Marital Status: Not on file     Family History: The patient's family history includes Heart attack (age of onset: 14) in her father; Heart disease in her father; Stroke (age of onset: 34) in her mother. There is no history of Colon cancer, Esophageal cancer, or Cancer.  ROS:   ROS Please see the history of present illness.   She bruises very easily and has tenderness along the tibia bilaterally.  She has felt so weak that she has stopped her pool aquatic program.  All other systems reviewed and are negative.  EKGs/Labs/Other Studies Reviewed:    The following studies were reviewed today:   EKG:  EKG is  ordered today.  The ekg ordered today is personally reviewed and demonstrates sinus rhythm and is normal  Recent Labs: 11/06/2020: ALT 28; BUN 18; Creatinine 1.0; Hemoglobin 14.1; Platelets 244; Potassium 4.7; Sodium 139    Physical Exam:    VS:  BP 119/84 (BP Location: Left Arm)   Ht 5\' 5"  (1.651 m)   Wt 123 lb (55.8 kg)   BMI 20.47 kg/m     Wt Readings from Last 3 Encounters:  12/01/20 123 lb (55.8 kg)  11/06/20 125 lb 1.6 oz (56.7 kg)  10/31/20 124 lb (56.2 kg)     GEN: Peers her age well nourished, well developed in no acute distress no xanthoma or xanthelasma she has multiple skin ecchymosis HEENT: Normal NECK: No JVD; No carotid bruits LYMPHATICS: No lymphadenopathy CARDIAC: RRR, no  murmurs, rubs, gallops RESPIRATORY:  Clear to auscultation without rales, wheezing or rhonchi  ABDOMEN: Soft,  non-tender, non-distended MUSCULOSKELETAL:  No edema; No deformity  SKIN: Warm and dry NEUROLOGIC:  Alert and oriented x 3 PSYCHIATRIC:  Normal affect     Signed, Shirlee More, MD  12/01/2020 10:19 AM    [place]

## 2020-12-01 ENCOUNTER — Encounter: Payer: Self-pay | Admitting: Cardiology

## 2020-12-01 ENCOUNTER — Ambulatory Visit (INDEPENDENT_AMBULATORY_CARE_PROVIDER_SITE_OTHER): Payer: Medicare Other

## 2020-12-01 ENCOUNTER — Other Ambulatory Visit: Payer: Self-pay

## 2020-12-01 ENCOUNTER — Ambulatory Visit (INDEPENDENT_AMBULATORY_CARE_PROVIDER_SITE_OTHER): Payer: Medicare Other | Admitting: Cardiology

## 2020-12-01 VITALS — BP 119/84 | Ht 65.0 in | Wt 123.0 lb

## 2020-12-01 DIAGNOSIS — R0602 Shortness of breath: Secondary | ICD-10-CM

## 2020-12-01 DIAGNOSIS — R002 Palpitations: Secondary | ICD-10-CM

## 2020-12-01 DIAGNOSIS — E2740 Unspecified adrenocortical insufficiency: Secondary | ICD-10-CM

## 2020-12-01 DIAGNOSIS — E7800 Pure hypercholesterolemia, unspecified: Secondary | ICD-10-CM

## 2020-12-01 DIAGNOSIS — R0600 Dyspnea, unspecified: Secondary | ICD-10-CM

## 2020-12-01 DIAGNOSIS — I119 Hypertensive heart disease without heart failure: Secondary | ICD-10-CM

## 2020-12-01 DIAGNOSIS — I10 Essential (primary) hypertension: Secondary | ICD-10-CM | POA: Diagnosis not present

## 2020-12-01 MED ORDER — ROSUVASTATIN CALCIUM 10 MG PO TABS: 10.0000 mg | ORAL_TABLET | Freq: Every day | ORAL | 3 refills | Status: DC

## 2020-12-01 NOTE — Patient Instructions (Signed)
Medication Instructions:  Your physician has recommended you make the following change in your medication:  STOP: Pravastatin  START: Rosuvastatin 10 mg take one tablet by mouth daily.  *If you need a refill on your cardiac medications before your next appointment, please call your pharmacy*   Lab Work: None If you have labs (blood work) drawn today and your tests are completely normal, you will receive your results only by: Marland Kitchen MyChart Message (if you have MyChart) OR . A paper copy in the mail If you have any lab test that is abnormal or we need to change your treatment, we will call you to review the results.   Testing/Procedures: Your physician has requested that you have an echocardiogram. Echocardiography is a painless test that uses sound waves to create images of your heart. It provides your doctor with information about the size and shape of your heart and how well your heart's chambers and valves are working. This procedure takes approximately one hour. There are no restrictions for this procedure.  A zio monitor was ordered today. It will remain on for 7 days. You will then return monitor and event diary in provided box. It takes 1-2 weeks for report to be downloaded and returned to [place]. We will call you with the results. If monitor falls off or has orange flashing light, please call Zio for further instructions.   We have put in the order for you to have a CT calcium score completed. They will call you to schedule this test.     Follow-Up: At [facility], you and your health needs are our priority.  As part of our continuing mission to provide you with exceptional heart care, we have created designated Provider Care Teams.  These Care Teams include your primary Cardiologist (physician) and Advanced Practice Providers (APPs -  Physician Assistants and Nurse Practitioners) who all work together to provide you with the care you need, when you need it.  We recommend signing up for  the patient portal called "MyChart".  Sign up information is provided on this After Visit Summary.  MyChart is used to connect with patients for Virtual Visits (Telemedicine).  Patients are able to view lab/test results, encounter notes, upcoming appointments, etc.  Non-urgent messages can be sent to your provider as well.   To learn more about what you can do with MyChart, go to [email or web address].    Your next appointment:   6 week(s)  The format for your next appointment:   In Person  Provider:   Shirlee More, MD   Other Instructions

## 2020-12-08 ENCOUNTER — Institutional Professional Consult (permissible substitution): Payer: Medicare Other | Admitting: Pulmonary Disease

## 2020-12-10 ENCOUNTER — Telehealth: Payer: Self-pay | Admitting: Neurology

## 2020-12-10 DIAGNOSIS — E2740 Unspecified adrenocortical insufficiency: Secondary | ICD-10-CM | POA: Diagnosis not present

## 2020-12-10 DIAGNOSIS — M62551 Muscle wasting and atrophy, not elsewhere classified, right thigh: Secondary | ICD-10-CM | POA: Diagnosis not present

## 2020-12-10 DIAGNOSIS — M6281 Muscle weakness (generalized): Secondary | ICD-10-CM | POA: Diagnosis not present

## 2020-12-10 DIAGNOSIS — M62521 Muscle wasting and atrophy, not elsewhere classified, right upper arm: Secondary | ICD-10-CM | POA: Diagnosis not present

## 2020-12-10 DIAGNOSIS — M62552 Muscle wasting and atrophy, not elsewhere classified, left thigh: Secondary | ICD-10-CM | POA: Diagnosis not present

## 2020-12-10 DIAGNOSIS — M5459 Other low back pain: Secondary | ICD-10-CM | POA: Diagnosis not present

## 2020-12-10 NOTE — Telephone Encounter (Signed)
I lvm for pt to call back to r/s from bump list

## 2020-12-10 NOTE — Telephone Encounter (Signed)
Made in error

## 2020-12-15 DIAGNOSIS — M62551 Muscle wasting and atrophy, not elsewhere classified, right thigh: Secondary | ICD-10-CM | POA: Diagnosis not present

## 2020-12-15 DIAGNOSIS — E2740 Unspecified adrenocortical insufficiency: Secondary | ICD-10-CM | POA: Diagnosis not present

## 2020-12-15 DIAGNOSIS — M62521 Muscle wasting and atrophy, not elsewhere classified, right upper arm: Secondary | ICD-10-CM | POA: Diagnosis not present

## 2020-12-15 DIAGNOSIS — M6281 Muscle weakness (generalized): Secondary | ICD-10-CM | POA: Diagnosis not present

## 2020-12-15 DIAGNOSIS — M62552 Muscle wasting and atrophy, not elsewhere classified, left thigh: Secondary | ICD-10-CM | POA: Diagnosis not present

## 2020-12-15 DIAGNOSIS — M5459 Other low back pain: Secondary | ICD-10-CM | POA: Diagnosis not present

## 2020-12-16 ENCOUNTER — Ambulatory Visit (INDEPENDENT_AMBULATORY_CARE_PROVIDER_SITE_OTHER)
Admission: RE | Admit: 2020-12-16 | Discharge: 2020-12-16 | Disposition: A | Discharge status: Home / Self Care | Payer: Self-pay | Source: Ambulatory Visit | Attending: Cardiology | Admitting: Cardiology

## 2020-12-16 ENCOUNTER — Other Ambulatory Visit: Payer: Self-pay

## 2020-12-16 DIAGNOSIS — R002 Palpitations: Secondary | ICD-10-CM | POA: Diagnosis not present

## 2020-12-16 DIAGNOSIS — E2740 Unspecified adrenocortical insufficiency: Secondary | ICD-10-CM

## 2020-12-16 DIAGNOSIS — I10 Essential (primary) hypertension: Secondary | ICD-10-CM

## 2020-12-16 DIAGNOSIS — R0602 Shortness of breath: Secondary | ICD-10-CM

## 2020-12-16 DIAGNOSIS — I119 Hypertensive heart disease without heart failure: Secondary | ICD-10-CM

## 2020-12-16 DIAGNOSIS — E7800 Pure hypercholesterolemia, unspecified: Secondary | ICD-10-CM

## 2020-12-17 ENCOUNTER — Telehealth: Payer: Self-pay | Admitting: *Deleted

## 2020-12-17 ENCOUNTER — Telehealth: Payer: Self-pay | Admitting: Cardiology

## 2020-12-17 DIAGNOSIS — E2740 Unspecified adrenocortical insufficiency: Secondary | ICD-10-CM | POA: Diagnosis not present

## 2020-12-17 DIAGNOSIS — M6281 Muscle weakness (generalized): Secondary | ICD-10-CM | POA: Diagnosis not present

## 2020-12-17 DIAGNOSIS — M62552 Muscle wasting and atrophy, not elsewhere classified, left thigh: Secondary | ICD-10-CM | POA: Diagnosis not present

## 2020-12-17 DIAGNOSIS — M62521 Muscle wasting and atrophy, not elsewhere classified, right upper arm: Secondary | ICD-10-CM | POA: Diagnosis not present

## 2020-12-17 DIAGNOSIS — M62551 Muscle wasting and atrophy, not elsewhere classified, right thigh: Secondary | ICD-10-CM | POA: Diagnosis not present

## 2020-12-17 DIAGNOSIS — M5459 Other low back pain: Secondary | ICD-10-CM | POA: Diagnosis not present

## 2020-12-17 MED ORDER — ACEBUTOLOL HCL 200 MG PO CAPS: 200.0000 mg | ORAL_CAPSULE | Freq: Two times a day (BID) | ORAL | 2 refills | Status: DC

## 2020-12-17 NOTE — Telephone Encounter (Signed)
-----   Message from Richardo Priest, MD sent at 12/17/2020  1:16 PM EST ----- Her monitor was very helpful, she is having atrial premature beats and brief runs of atrial premature beats.  This is not dangerous but can be a real nuisance.  For the time being I would like her to start a beta-blocker Sectral 200 mg twice daily and after the first week if she is doing well she can reduce it to once a day.  I think this would be very helpful in giving her relief  At office follow-up we can decide if she stays on it long-term or just takes as needed.

## 2020-12-17 NOTE — Addendum Note (Signed)
Addended by: Truddie Hidden on: 12/17/2020 03:06 PM   Modules accepted: Orders

## 2020-12-17 NOTE — Telephone Encounter (Signed)
No answer. Left message to call back.   

## 2020-12-17 NOTE — Telephone Encounter (Signed)
Pt c/o medication issue:  1. Name of Medication: acebutolol (SECTRAL) 200 MG capsule  2. How are you currently taking this medication (dosage and times per day)? N/A  3. Are you having a reaction (difficulty breathing--STAT)? No   4. What is your medication issue? Caryl Pina with [facility] is calling to verify that the patient was intentionally prescribed the Acebutolol medication. She states the patient's PCP prescribed Metoprolol and she would like to ensure that Dr. Bettina Gavia is aware of this. Please return call to discuss.  Phone#: [phone]

## 2020-12-18 ENCOUNTER — Other Ambulatory Visit: Payer: Self-pay

## 2020-12-18 NOTE — Telephone Encounter (Signed)
I think she do better to stop metoprolol and take Sectral.

## 2020-12-18 NOTE — Telephone Encounter (Signed)
Called [facility] and advised her that the pt needs to stop metoprolol and start sectral.

## 2020-12-18 NOTE — Telephone Encounter (Signed)
Please advise which you would like pt to take.

## 2020-12-18 NOTE — Telephone Encounter (Signed)
Called patient. She reports this has already been reported to her and she already picked up the medicine. No further questions.

## 2020-12-18 NOTE — Telephone Encounter (Signed)
Patient returning call.

## 2020-12-23 DIAGNOSIS — M62521 Muscle wasting and atrophy, not elsewhere classified, right upper arm: Secondary | ICD-10-CM | POA: Diagnosis not present

## 2020-12-23 DIAGNOSIS — M62552 Muscle wasting and atrophy, not elsewhere classified, left thigh: Secondary | ICD-10-CM | POA: Diagnosis not present

## 2020-12-23 DIAGNOSIS — M6281 Muscle weakness (generalized): Secondary | ICD-10-CM | POA: Diagnosis not present

## 2020-12-23 DIAGNOSIS — E2740 Unspecified adrenocortical insufficiency: Secondary | ICD-10-CM | POA: Diagnosis not present

## 2020-12-23 DIAGNOSIS — M62551 Muscle wasting and atrophy, not elsewhere classified, right thigh: Secondary | ICD-10-CM | POA: Diagnosis not present

## 2020-12-23 DIAGNOSIS — M5459 Other low back pain: Secondary | ICD-10-CM | POA: Diagnosis not present

## 2020-12-24 DIAGNOSIS — K589 Irritable bowel syndrome without diarrhea: Secondary | ICD-10-CM | POA: Diagnosis not present

## 2020-12-24 DIAGNOSIS — R251 Tremor, unspecified: Secondary | ICD-10-CM | POA: Diagnosis not present

## 2020-12-24 DIAGNOSIS — K219 Gastro-esophageal reflux disease without esophagitis: Secondary | ICD-10-CM | POA: Diagnosis not present

## 2020-12-24 DIAGNOSIS — M545 Low back pain, unspecified: Secondary | ICD-10-CM | POA: Diagnosis not present

## 2020-12-24 DIAGNOSIS — R0600 Dyspnea, unspecified: Secondary | ICD-10-CM | POA: Diagnosis not present

## 2020-12-24 DIAGNOSIS — E2740 Unspecified adrenocortical insufficiency: Secondary | ICD-10-CM | POA: Diagnosis not present

## 2020-12-24 DIAGNOSIS — F3341 Major depressive disorder, recurrent, in partial remission: Secondary | ICD-10-CM | POA: Diagnosis not present

## 2020-12-24 DIAGNOSIS — M2550 Pain in unspecified joint: Secondary | ICD-10-CM | POA: Diagnosis not present

## 2020-12-24 DIAGNOSIS — E7800 Pure hypercholesterolemia, unspecified: Secondary | ICD-10-CM | POA: Diagnosis not present

## 2020-12-24 DIAGNOSIS — Z9181 History of falling: Secondary | ICD-10-CM | POA: Diagnosis not present

## 2020-12-24 DIAGNOSIS — J309 Allergic rhinitis, unspecified: Secondary | ICD-10-CM | POA: Diagnosis not present

## 2020-12-24 DIAGNOSIS — R7989 Other specified abnormal findings of blood chemistry: Secondary | ICD-10-CM | POA: Diagnosis not present

## 2020-12-29 ENCOUNTER — Ambulatory Visit: Payer: Medicare Other | Admitting: Neurology

## 2020-12-29 DIAGNOSIS — Z20822 Contact with and (suspected) exposure to covid-19: Secondary | ICD-10-CM | POA: Diagnosis not present

## 2020-12-30 ENCOUNTER — Other Ambulatory Visit: Payer: Self-pay

## 2020-12-30 ENCOUNTER — Ambulatory Visit (INDEPENDENT_AMBULATORY_CARE_PROVIDER_SITE_OTHER): Payer: Medicare Other

## 2020-12-30 DIAGNOSIS — M62552 Muscle wasting and atrophy, not elsewhere classified, left thigh: Secondary | ICD-10-CM | POA: Diagnosis not present

## 2020-12-30 DIAGNOSIS — R0602 Shortness of breath: Secondary | ICD-10-CM

## 2020-12-30 DIAGNOSIS — M6281 Muscle weakness (generalized): Secondary | ICD-10-CM | POA: Diagnosis not present

## 2020-12-30 DIAGNOSIS — M5459 Other low back pain: Secondary | ICD-10-CM | POA: Diagnosis not present

## 2020-12-30 DIAGNOSIS — M62521 Muscle wasting and atrophy, not elsewhere classified, right upper arm: Secondary | ICD-10-CM | POA: Diagnosis not present

## 2020-12-30 DIAGNOSIS — E2740 Unspecified adrenocortical insufficiency: Secondary | ICD-10-CM | POA: Diagnosis not present

## 2020-12-30 DIAGNOSIS — R002 Palpitations: Secondary | ICD-10-CM | POA: Diagnosis not present

## 2020-12-30 DIAGNOSIS — M62551 Muscle wasting and atrophy, not elsewhere classified, right thigh: Secondary | ICD-10-CM | POA: Diagnosis not present

## 2020-12-30 LAB — ECHOCARDIOGRAM COMPLETE
Area-P 1/2: 3.72 cm2
S' Lateral: 2.33 cm

## 2020-12-30 NOTE — Progress Notes (Signed)
Complete echocardiogram performed.  Jimmy Keagan Anthis RDCS, RVT  

## 2020-12-31 ENCOUNTER — Telehealth: Payer: Self-pay | Admitting: Cardiology

## 2020-12-31 NOTE — Telephone Encounter (Signed)
Spoke with patient regarding results and recommendation.  Patient verbalizes understanding and is agreeable to plan of care. Advised patient to call back with any issues or concerns.  

## 2020-12-31 NOTE — Telephone Encounter (Signed)
Meghan Tucker is calling stating she received a call from our office this morning in regards to her Echo results, but I was unable to find documentation of who it was that called. Please advise.

## 2021-01-01 ENCOUNTER — Other Ambulatory Visit: Payer: Self-pay | Admitting: Family Medicine

## 2021-01-01 DIAGNOSIS — M62552 Muscle wasting and atrophy, not elsewhere classified, left thigh: Secondary | ICD-10-CM | POA: Diagnosis not present

## 2021-01-01 DIAGNOSIS — M62521 Muscle wasting and atrophy, not elsewhere classified, right upper arm: Secondary | ICD-10-CM | POA: Diagnosis not present

## 2021-01-01 DIAGNOSIS — M5459 Other low back pain: Secondary | ICD-10-CM | POA: Diagnosis not present

## 2021-01-01 DIAGNOSIS — M62551 Muscle wasting and atrophy, not elsewhere classified, right thigh: Secondary | ICD-10-CM | POA: Diagnosis not present

## 2021-01-01 DIAGNOSIS — M6281 Muscle weakness (generalized): Secondary | ICD-10-CM | POA: Diagnosis not present

## 2021-01-01 DIAGNOSIS — E2740 Unspecified adrenocortical insufficiency: Secondary | ICD-10-CM | POA: Diagnosis not present

## 2021-01-05 DIAGNOSIS — M6281 Muscle weakness (generalized): Secondary | ICD-10-CM | POA: Diagnosis not present

## 2021-01-05 DIAGNOSIS — M62552 Muscle wasting and atrophy, not elsewhere classified, left thigh: Secondary | ICD-10-CM | POA: Diagnosis not present

## 2021-01-05 DIAGNOSIS — M5459 Other low back pain: Secondary | ICD-10-CM | POA: Diagnosis not present

## 2021-01-05 DIAGNOSIS — M62521 Muscle wasting and atrophy, not elsewhere classified, right upper arm: Secondary | ICD-10-CM | POA: Diagnosis not present

## 2021-01-05 DIAGNOSIS — M62551 Muscle wasting and atrophy, not elsewhere classified, right thigh: Secondary | ICD-10-CM | POA: Diagnosis not present

## 2021-01-05 DIAGNOSIS — E2740 Unspecified adrenocortical insufficiency: Secondary | ICD-10-CM | POA: Diagnosis not present

## 2021-01-07 DIAGNOSIS — M4802 Spinal stenosis, cervical region: Secondary | ICD-10-CM | POA: Diagnosis not present

## 2021-01-07 DIAGNOSIS — Z6825 Body mass index (BMI) 25.0-25.9, adult: Secondary | ICD-10-CM | POA: Diagnosis not present

## 2021-01-07 DIAGNOSIS — I10 Essential (primary) hypertension: Secondary | ICD-10-CM | POA: Diagnosis not present

## 2021-01-09 DIAGNOSIS — M62552 Muscle wasting and atrophy, not elsewhere classified, left thigh: Secondary | ICD-10-CM | POA: Diagnosis not present

## 2021-01-09 DIAGNOSIS — M62551 Muscle wasting and atrophy, not elsewhere classified, right thigh: Secondary | ICD-10-CM | POA: Diagnosis not present

## 2021-01-09 DIAGNOSIS — R251 Tremor, unspecified: Secondary | ICD-10-CM | POA: Diagnosis not present

## 2021-01-09 DIAGNOSIS — M2550 Pain in unspecified joint: Secondary | ICD-10-CM | POA: Diagnosis not present

## 2021-01-09 DIAGNOSIS — R7989 Other specified abnormal findings of blood chemistry: Secondary | ICD-10-CM | POA: Diagnosis not present

## 2021-01-09 DIAGNOSIS — K219 Gastro-esophageal reflux disease without esophagitis: Secondary | ICD-10-CM | POA: Diagnosis not present

## 2021-01-09 DIAGNOSIS — E2740 Unspecified adrenocortical insufficiency: Secondary | ICD-10-CM | POA: Diagnosis not present

## 2021-01-09 DIAGNOSIS — L0390 Cellulitis, unspecified: Secondary | ICD-10-CM | POA: Diagnosis not present

## 2021-01-09 DIAGNOSIS — M62521 Muscle wasting and atrophy, not elsewhere classified, right upper arm: Secondary | ICD-10-CM | POA: Diagnosis not present

## 2021-01-09 DIAGNOSIS — K589 Irritable bowel syndrome without diarrhea: Secondary | ICD-10-CM | POA: Diagnosis not present

## 2021-01-09 DIAGNOSIS — M5459 Other low back pain: Secondary | ICD-10-CM | POA: Diagnosis not present

## 2021-01-09 DIAGNOSIS — M545 Low back pain, unspecified: Secondary | ICD-10-CM | POA: Diagnosis not present

## 2021-01-09 DIAGNOSIS — G4700 Insomnia, unspecified: Secondary | ICD-10-CM | POA: Diagnosis not present

## 2021-01-09 DIAGNOSIS — J309 Allergic rhinitis, unspecified: Secondary | ICD-10-CM | POA: Diagnosis not present

## 2021-01-09 DIAGNOSIS — F3341 Major depressive disorder, recurrent, in partial remission: Secondary | ICD-10-CM | POA: Diagnosis not present

## 2021-01-09 DIAGNOSIS — M6281 Muscle weakness (generalized): Secondary | ICD-10-CM | POA: Diagnosis not present

## 2021-01-09 DIAGNOSIS — R0600 Dyspnea, unspecified: Secondary | ICD-10-CM | POA: Diagnosis not present

## 2021-01-13 DIAGNOSIS — M5459 Other low back pain: Secondary | ICD-10-CM | POA: Diagnosis not present

## 2021-01-13 DIAGNOSIS — M62551 Muscle wasting and atrophy, not elsewhere classified, right thigh: Secondary | ICD-10-CM | POA: Diagnosis not present

## 2021-01-13 DIAGNOSIS — M62521 Muscle wasting and atrophy, not elsewhere classified, right upper arm: Secondary | ICD-10-CM | POA: Diagnosis not present

## 2021-01-13 DIAGNOSIS — M6281 Muscle weakness (generalized): Secondary | ICD-10-CM | POA: Diagnosis not present

## 2021-01-13 DIAGNOSIS — E2740 Unspecified adrenocortical insufficiency: Secondary | ICD-10-CM | POA: Diagnosis not present

## 2021-01-13 DIAGNOSIS — M62552 Muscle wasting and atrophy, not elsewhere classified, left thigh: Secondary | ICD-10-CM | POA: Diagnosis not present

## 2021-01-13 NOTE — Progress Notes (Signed)
Cardiology Office Note:    Date:  01/14/2021   ID:  Meghan Tucker, DOB 02/13/1946, MRN [id number]  PCP:  Cher Nakai, MD  Cardiologist:  Shirlee More, MD    Referring MD: Cher Nakai, MD    ASSESSMENT:    1. Shortness of breath   2. Essential hypertension   3. Pure hypercholesterolemia   4. Palpitations    PLAN:    In order of problems listed above:  1. She is improved with adrenal replacement therapy and is scheduled to see endocrinology next week I think in retrospect her symptoms were due to her endocrine disease she has no evidence of cardiac structural disease significant arrhythmia or high risk for CAD she does have severe dyslipidemia aortic atherosclerosis despite a calcium score approaching 0 I would keep her on a statin.  She agrees with this approach. 2. Stable continue beta-blocker 3. Continue statin 4. We will change her beta-blocker to as needed as she had no sustained severe arrhythmia on her monitor   Next appointment: I will plan to see her back in the office in follow-up   Medication Adjustments/Labs and Tests Ordered: Current medicines are reviewed at length with the patient today.  Concerns regarding medicines are outlined above.  No orders of the defined types were placed in this encounter.  No orders of the defined types were placed in this encounter.   Chief Complaint  Patient presents with  . Follow-up    After multiple cardiac test    History of Present Illness:    Meghan Tucker is a 75 y.o. female with a hx of hypertension hyperlipidemia adrenal insufficiency last seen 12/01/2020 for evaluation of shortness of breath with activity.She was evaluated by Dr. Bobby Rumpf hematology for ease of bruising 11/06/2020.  He notes she had a similar evaluation 9-year prior with no evidence of bleeding disorder.  Laboratory test 11/06/2020 shows a hemoglobin of 14.1 increased MCV platelet count 244,000 her INR pro time and PTT were normal was diagnosed with senile  purpura.   She was seen by GI Dr. Lyndel Safe 10/31/2020 with a history of GERD and irritable bowel syndrome with diarrhea.   She was seen in cardiology consultation by Dr. Elease Hashimoto 08/22/2014 [facility] for dyslipidemia recommended resuming a low-dose of a statin and did not feel that she can required a cardiac ischemia evaluation.  Her EKG at that visit was described as sinus rhythm with nonspecific T wave changes She had CT of the abdomen and pelvis 11/04/2020 which was read as unremarkable, reviewing the vascular findings there is atherosclerosis in the abdominal aorta. She had a chest x-ray at [facility] 10/30/2020 read as normal. Ultrasound abdomen 10/13/2020 read as unremarkable no pathology   Recent labs from epic 07/08/2020 CMP showed a GFR 64 cc potassium 4.3 creatinine 0.9 glucose normal 85 and normal liver function test her lipid profile suggested familial hyperlipidemia with an LDL of 178 on a low intensity statin and was transition to rosuvastatin for better control.  Compliance with diet, lifestyle and medications: Yes  Further evaluation cardiac CT calcium score showed a score of 0.6 29th percentile for age and sex matched control quite low for her age.  Her echocardiogram showed EF 60 to 65% mild concentric LVH normal diastolic filling pressure normal right ventricular function and no significant valvular abnormality.  A event monitor was performed for 7 days that showed no significant arrhythmia except occasional APCs and brief runs of atrial premature contractions.  I reviewed her testing  results with her.  Her calcium score is close to 0 because of her severe dyslipidemia and aortic atherosclerosis I would keep her on a statin I do not think she requires an ischemia evaluation. She is improved with physical therapy and adrenal steroid replacement.  She sees endocrinology next week. Shortness of breath and palpitation of resolved no chest pain.  She tolerates her  statin without muscle pain or weakness. Past Medical History:  Diagnosis Date  . Arthritis    "all over"  . Chronic back pain    2 buldging disc,stenosis  . Diverticulosis   . Essential hypertension    takes Metoprolol daily  . GERD (gastroesophageal reflux disease)    takes Omeprazole daily  . History of hiatal hernia   . History of kidney stones   . History of kidney stones   . History of migraine    none since menopause yrs ago  . Hyperlipidemia    takes Fish Oil daily  . Hypertension    takes Metoprolol daily  . Insomnia    takes Ambien nightly  . Sciatica   . Sciatica   . Tingling    right arm.Both feet     Past Surgical History:  Procedure Laterality Date  . CARPAL TUNNEL RELEASE Left   . cataract surgery Bilateral   . COLONOSCOPY     Dr. Jerilynn Mages in [place]  . ESOPHAGOGASTRODUODENOSCOPY     Dr Jerilynn Mages in [place] [facility]  . ESOPHAGOGASTRODUODENOSCOPY (EGD) WITH ESOPHAGEAL DILATION  05/14/2015   with Propofol  . ESOPHAGOGASTRODUODENOSCOPY (EGD) WITH PROPOFOL  10/19/2013  . GANGLION CYST EXCISION    . HAMMERTOE RECONSTRUCTION WITH WEIL OSTEOTOMY Left 07/17/2015   Procedure: LEFT 2-3 WEIL OSTEOTOMY AND HAMMERTOE CORRECTION ;  Surgeon: [name], MD;  Location: [facility];  Service: Orthopedics;  Laterality: Left;    Current Medications: Current Meds  Medication Sig  . acebutolol (SECTRAL) 200 MG capsule Take 1 capsule (200 mg total) by mouth 2 (two) times daily.  . AMBULATORY NON FORMULARY MEDICATION 45 mg every morning. Protonics  . Apoaequorin (PREVAGEN PO) Take 1 tablet by mouth daily.  . Biotin 5 MG CAPS Take 5 mg by mouth daily.  Marland Kitchen CALCIUM CITRATE PO Take 1 tablet by mouth every other day.  . celecoxib (CELEBREX) 200 MG capsule Take 200 mg by mouth 2 (two) times daily.  . cetirizine (ZYRTEC) 10 MG tablet Take 10 mg by mouth daily.  . citalopram (CELEXA) 10 MG tablet TAKE 1 TABLET ONCE DAILY  . denosumab (PROLIA) 60 MG/ML SOLN injection Inject 60 mg into the  skin every 6 (six) months. Twice a year  . dicyclomine (BENTYL) 10 MG capsule Take 1 capsule (10 mg total) by mouth 2 (two) times daily.  . famotidine (PEPCID) 20 MG tablet Take 1 tablet (20 mg total) by mouth at bedtime.  . hydrocortisone (CORTEF) 10 MG tablet Take 10 mg by mouth. TAKE 1.5 TABLET ( 15 MG ) AM AND 1 TABLET 10 MG AFTERNOON  . hydrOXYzine (ATARAX/VISTARIL) 10 MG tablet Take 10 mg by mouth 2 (two) times daily.  . Inulin (FIBER CHOICE PO) Take by mouth every morning.  . pantoprazole (PROTONIX) 40 MG tablet Take 1 tablet (40 mg total) by mouth daily.  . potassium chloride (KLOR-CON) 10 MEQ tablet TAKE 1 TABLET ONCE DAILY.  . Probiotic Product (ALIGN PO) Take 1 tablet by mouth daily.   . Psyllium (METAMUCIL PO) Take by mouth every morning. 3 in 1 fiber  . rosuvastatin (  CRESTOR) 10 MG tablet Take 1 tablet (10 mg total) by mouth daily.  . Vitamin D, Ergocalciferol, (DRISDOL) 1.25 MG (50000 UNIT) CAPS capsule TAKE 1 CAPSULE ONCE A WEEK.  Marland Kitchen zolpidem (AMBIEN CR) 12.5 MG CR tablet Take 12.5 mg by mouth at bedtime.     Allergies:   Atorvastatin and Rosuvastatin   Social History   Socioeconomic History  . Marital status: Married    Spouse name: Not on file  . Number of children: Not on file  . Years of education: Not on file  . Highest education level: Not on file  Occupational History  . Not on file  Tobacco Use  . Smoking status: Never Smoker  . Smokeless tobacco: Never Used  Vaping Use  . Vaping Use: Never used  Substance and Sexual Activity  . Alcohol use: Yes    Comment: 1-2 glasses champagne nightly  . Drug use: No  . Sexual activity: Not on file  Other Topics Concern  . Not on file  Social History Narrative  . Not on file   Social Determinants of Health   Financial Resource Strain: Not on file  Food Insecurity: Not on file  Transportation Needs: Not on file  Physical Activity: Not on file  Stress: Not on file  Social Connections: Not on file     Family  History: The patient's family history includes Heart attack (age of onset: 60) in her father; Heart disease in her father; Stroke (age of onset: 63) in her mother. There is no history of Colon cancer, Esophageal cancer, or Cancer. ROS:   Please see the history of present illness.    All other systems reviewed and are negative.  EKGs/Labs/Other Studies Reviewed:    The following studies were reviewed today:    Recent Labs: 11/06/2020: ALT 28; BUN 18; Creatinine 1.0; Hemoglobin 14.1; Platelets 244; Potassium 4.7; Sodium 139    Physical Exam:    VS:  BP (!) 148/80   Pulse 74   Ht 5\' 5"  (1.651 m)   Wt 129 lb 3.2 oz (58.6 kg)   SpO2 99%   BMI 21.50 kg/m     Wt Readings from Last 3 Encounters:  01/14/21 129 lb 3.2 oz (58.6 kg)  12/01/20 123 lb (55.8 kg)  11/06/20 125 lb 1.6 oz (56.7 kg)     GEN: She looks markedly improved no longer looks chronically ill or debilitated well nourished, well developed in no acute distress HEENT: Normal NECK: No JVD; No carotid bruits LYMPHATICS: No lymphadenopathy CARDIAC: RRR, no murmurs, rubs, gallops RESPIRATORY:  Clear to auscultation without rales, wheezing or rhonchi  ABDOMEN: Soft, non-tender, non-distended MUSCULOSKELETAL:  No edema; No deformity  SKIN: Warm and dry NEUROLOGIC:  Alert and oriented x 3 PSYCHIATRIC:  Normal affect    Signed, Shirlee More, MD  01/14/2021 11:08 AM    [place]

## 2021-01-14 ENCOUNTER — Encounter: Payer: Self-pay | Admitting: Cardiology

## 2021-01-14 ENCOUNTER — Other Ambulatory Visit: Payer: Self-pay

## 2021-01-14 ENCOUNTER — Ambulatory Visit (INDEPENDENT_AMBULATORY_CARE_PROVIDER_SITE_OTHER): Payer: Medicare Other | Admitting: Cardiology

## 2021-01-14 VITALS — BP 148/80 | HR 74 | Ht 65.0 in | Wt 129.2 lb

## 2021-01-14 DIAGNOSIS — R002 Palpitations: Secondary | ICD-10-CM

## 2021-01-14 DIAGNOSIS — R0602 Shortness of breath: Secondary | ICD-10-CM

## 2021-01-14 DIAGNOSIS — I10 Essential (primary) hypertension: Secondary | ICD-10-CM | POA: Diagnosis not present

## 2021-01-14 DIAGNOSIS — E7800 Pure hypercholesterolemia, unspecified: Secondary | ICD-10-CM | POA: Diagnosis not present

## 2021-01-14 MED ORDER — ACEBUTOLOL HCL 200 MG PO CAPS
200.0000 mg | ORAL_CAPSULE | Freq: Two times a day (BID) | ORAL | 2 refills | Status: DC | PRN
Start: 2021-01-14 — End: 2021-05-27

## 2021-01-14 NOTE — Patient Instructions (Signed)

## 2021-01-15 DIAGNOSIS — L235 Allergic contact dermatitis due to other chemical products: Secondary | ICD-10-CM | POA: Diagnosis not present

## 2021-01-15 DIAGNOSIS — D692 Other nonthrombocytopenic purpura: Secondary | ICD-10-CM | POA: Diagnosis not present

## 2021-01-15 DIAGNOSIS — L820 Inflamed seborrheic keratosis: Secondary | ICD-10-CM | POA: Diagnosis not present

## 2021-01-16 ENCOUNTER — Encounter: Payer: Self-pay | Admitting: Internal Medicine

## 2021-01-16 ENCOUNTER — Ambulatory Visit (INDEPENDENT_AMBULATORY_CARE_PROVIDER_SITE_OTHER): Payer: Medicare Other | Admitting: Internal Medicine

## 2021-01-16 ENCOUNTER — Other Ambulatory Visit: Payer: Self-pay

## 2021-01-16 ENCOUNTER — Ambulatory Visit: Payer: Medicare Other | Admitting: Internal Medicine

## 2021-01-16 VITALS — BP 148/78 | HR 80 | Ht 65.0 in | Wt 128.5 lb

## 2021-01-16 DIAGNOSIS — R946 Abnormal results of thyroid function studies: Secondary | ICD-10-CM

## 2021-01-16 DIAGNOSIS — E2740 Unspecified adrenocortical insufficiency: Secondary | ICD-10-CM | POA: Diagnosis not present

## 2021-01-16 DIAGNOSIS — M62551 Muscle wasting and atrophy, not elsewhere classified, right thigh: Secondary | ICD-10-CM | POA: Diagnosis not present

## 2021-01-16 DIAGNOSIS — M62552 Muscle wasting and atrophy, not elsewhere classified, left thigh: Secondary | ICD-10-CM | POA: Diagnosis not present

## 2021-01-16 DIAGNOSIS — E2749 Other adrenocortical insufficiency: Secondary | ICD-10-CM

## 2021-01-16 DIAGNOSIS — M62521 Muscle wasting and atrophy, not elsewhere classified, right upper arm: Secondary | ICD-10-CM | POA: Diagnosis not present

## 2021-01-16 DIAGNOSIS — M5459 Other low back pain: Secondary | ICD-10-CM | POA: Diagnosis not present

## 2021-01-16 DIAGNOSIS — M6281 Muscle weakness (generalized): Secondary | ICD-10-CM | POA: Diagnosis not present

## 2021-01-16 HISTORY — DX: Other adrenocortical insufficiency: E27.49

## 2021-01-16 LAB — COMPREHENSIVE METABOLIC PANEL
ALT: 25 U/L (ref 0–35)
AST: 22 U/L (ref 0–37)
Albumin: 4.3 g/dL (ref 3.5–5.2)
Alkaline Phosphatase: 69 U/L (ref 39–117)
BUN: 21 mg/dL (ref 6–23)
CO2: 30 mEq/L (ref 19–32)
Calcium: 10.3 mg/dL (ref 8.4–10.5)
Chloride: 104 mEq/L (ref 96–112)
Creatinine, Ser: 0.86 mg/dL (ref 0.40–1.20)
GFR: 66.66 mL/min (ref >60.00)
Glucose, Bld: 92 mg/dL (ref 70–99)
Potassium: 4.5 mEq/L (ref 3.5–5.1)
Sodium: 142 mEq/L (ref 135–145)
Total Bilirubin: 1.1 mg/dL (ref 0.2–1.2)
Total Protein: 6.8 g/dL (ref 6.0–8.3)

## 2021-01-16 LAB — FOLLICLE STIMULATING HORMONE: FSH: 77 m[IU]/mL

## 2021-01-16 LAB — T4, FREE: Free T4: 2.39 ng/dL — ABNORMAL HIGH (ref 0.60–1.60)

## 2021-01-16 LAB — TSH: TSH: 0.01 u[IU]/mL — ABNORMAL LOW (ref 0.35–4.50)

## 2021-01-16 NOTE — Patient Instructions (Addendum)
-   Continue hydrocortisone 15 mg in the morning and 5 mg in the afternoon    - Please obtain a medical alert bracelet      ADRENAL INSUFFICIENCY SICK DAY RULES:  Should you face an extreme emotional or physical stress such as trauma, surgery or acute illness, this will require extra steroid coverage so that the body can meet that stress.   Without increasing the steroid dose you may experience severe weakness, headache, dizziness, nausea and vomiting and possibly a more serious deterioration in health.  Typically the dose of steroids will only need to be increased for a couple of days if you have an illness that is transient and managed in the community.   If you are unable to take/absorb an increased dose of steroids orally because of vomiting or diarrhea, you will urgently require steroid injections and should present to an Emergency Department.  The general advice for any serious illness is as follows: 1. Double the normal daily steroid dose for up to 3 days if you have a temperature of more than 37.50C (99.27F) with signs of sickness, or severe emotional or physical distress 2. Contact your primary care doctor and Endocrinologist if the illness worsens or it lasts for more than 3 days.  3. In cases of severe illness, urgent medical assistance should be promptly sought. 4. If you experience vomiting/diarrhea or are unable to take steroids by mouth,  Please seek urgent medical help.

## 2021-01-16 NOTE — Progress Notes (Signed)
Name: Zuleika Cabazos  MRN/ DOB: [id number], 04/13/1946    Age/ Sex: 75 y.o., female    PCP: Simone Curia, MD   Reason for Endocrinology Evaluation: Adrenal insufficiency     Date of Initial Endocrinology Evaluation: 01/16/2021     HPI: Ms. Helon Jewell is a 75 y.o. female with a past medical history of OA and insomnia . The patient presented for initial endocrinology clinic visit on 01/16/2021 for consultative assistance with her adrenal insufficiency.   Ms. Sponaugle presented to her PCP in 10/2020 with 6 month history of fatigue , weight loss and myalgias.  In September 2021 she was having back issues and was unable to get up due to lack of energy from September 2021 until 10/2020 . During this  Time she was evaluated by cardiology- work up normal .   Sees neurosurgery  ( Dr. Danielle Dess) for back pains for years.   She does endorse back injections x2 in 08/2020   She was given a kenalog injection in 11/11/2020 with dramatic clinical improvement per PCP  Cortisol level low 0.6 on 11/18 /2021 ACTH undetectable on 11/ 23/2021    No narcotic intake  No marijuana use   Today she  Feels at least 50 % better.  Follows with physical therapy   HC 10 mg, 1.5 tabs QAM  HC 10 mg, half a tablet in the afternoon     Denies nausea, diarrhea or constipation  Has occasional GERD Denies dizziness   Has sensitive gums and teeth  Feet freeze especially on the right  She has noted scalp and ear itching as well as left lid heavy   Mother with thyroid disease     HISTORY:  Past Medical History:  Past Medical History:  Diagnosis Date   Arthritis    "all over"   Chronic back pain    2 buldging disc,stenosis   Diverticulosis    Essential hypertension    takes Metoprolol daily   GERD (gastroesophageal reflux disease)    takes Omeprazole daily   History of hiatal hernia    History of kidney stones    History of kidney stones    History of migraine    none since menopause yrs  ago   Hyperlipidemia    takes Fish Oil daily   Hypertension    takes Metoprolol daily   Insomnia    takes Ambien nightly   Sciatica    Sciatica    Tingling    right arm.Both feet    Past Surgical History:  Past Surgical History:  Procedure Laterality Date   CARPAL TUNNEL RELEASE Left    cataract surgery Bilateral    COLONOSCOPY     Dr. Judie Petit in [place]   ESOPHAGOGASTRODUODENOSCOPY     Dr Judie Petit in [place]   ESOPHAGOGASTRODUODENOSCOPY (EGD) WITH ESOPHAGEAL DILATION  05/14/2015   with Propofol   ESOPHAGOGASTRODUODENOSCOPY (EGD) WITH PROPOFOL  10/19/2013   GANGLION CYST EXCISION     HAMMERTOE RECONSTRUCTION WITH WEIL OSTEOTOMY Left 07/17/2015   Procedure: LEFT 2-3 WEIL OSTEOTOMY AND HAMMERTOE CORRECTION ;  Surgeon: Toni Arthurs, MD;  Location: [facility];  Service: Orthopedics;  Laterality: Left;    Social History:  reports that she has never smoked. She has never used smokeless tobacco. She reports current alcohol use. She reports that she does not use drugs. Family History: family history includes Heart attack (age of onset: 45) in her father; Heart disease in her father; Stroke (age of onset: 71) in her  mother.   HOME MEDICATIONS: Allergies as of 01/16/2021      Reactions   Atorvastatin Other (See Comments)   MYALGIAS MYALGIAS Other reaction(s): Myalgias (intolerance), Other, Other (See Comments) MYALGIAS MYALGIAS MYALGIAS   Rosuvastatin Other (See Comments)   MYALGIAS MYALGIAS Other reaction(s): Myalgias (intolerance), Other, Other (See Comments) MYALGIAS MYALGIAS MYALGIAS      Medication List       Accurate as of January 16, 2021  2:06 PM. If you have any questions, ask your nurse or doctor.        acebutolol 200 MG capsule Commonly known as: SECTRAL Take 1 capsule (200 mg total) by mouth 2 (two) times daily as needed.   ALIGN PO Take 1 tablet by mouth daily.   AMBULATORY NON FORMULARY MEDICATION 45 mg every morning. Protonics    Biotin 5 MG Caps Take 5 mg by mouth daily.   CALCIUM CITRATE PO Take 1 tablet by mouth every other day.   celecoxib 200 MG capsule Commonly known as: CELEBREX Take 200 mg by mouth 2 (two) times daily.   cetirizine 10 MG tablet Commonly known as: ZYRTEC Take 10 mg by mouth daily.   citalopram 10 MG tablet Commonly known as: CELEXA TAKE 1 TABLET ONCE DAILY   denosumab 60 MG/ML Soln injection Commonly known as: PROLIA Inject 60 mg into the skin every 6 (six) months. Twice a year   dicyclomine 10 MG capsule Commonly known as: BENTYL Take 1 capsule (10 mg total) by mouth 2 (two) times daily.   famotidine 20 MG tablet Commonly known as: Pepcid Take 1 tablet (20 mg total) by mouth at bedtime.   FIBER CHOICE PO Take by mouth every morning.   hydrocortisone 10 MG tablet Commonly known as: CORTEF Take 10 mg by mouth. TAKE 1.5 TABLET ( 15 MG ) AM AND 1 TABLET 10 MG AFTERNOON   hydrOXYzine 10 MG tablet Commonly known as: ATARAX/VISTARIL Take 10 mg by mouth 2 (two) times daily.   METAMUCIL PO Take by mouth every morning. 3 in 1 fiber   pantoprazole 40 MG tablet Commonly known as: Protonix Take 1 tablet (40 mg total) by mouth daily.   potassium chloride 10 MEQ tablet Commonly known as: KLOR-CON TAKE 1 TABLET ONCE DAILY.   PREVAGEN PO Take 1 tablet by mouth daily.   rosuvastatin 10 MG tablet Commonly known as: CRESTOR Take 1 tablet (10 mg total) by mouth daily.   Vitamin D (Ergocalciferol) 1.25 MG (50000 UNIT) Caps capsule Commonly known as: DRISDOL TAKE 1 CAPSULE ONCE A WEEK.   zolpidem 12.5 MG CR tablet Commonly known as: AMBIEN CR Take 12.5 mg by mouth at bedtime.         REVIEW OF SYSTEMS: A comprehensive ROS was conducted with the patient and is negative except as per HPI    OBJECTIVE:  VS: BP (!) 148/78    Pulse 80    Ht 5\' 5"  (1.651 m)    Wt 128 lb 8 oz (58.3 kg)    SpO2 97%    BMI 21.38 kg/m    Wt Readings from Last 3 Encounters:  01/16/21  128 lb 8 oz (58.3 kg)  01/14/21 129 lb 3.2 oz (58.6 kg)  12/01/20 123 lb (55.8 kg)   Body surface area is 1.64 meters squared.   EXAM: General: Pt appears well and is in NAD  Neck: General: Supple without adenopathy. Thyroid: Thyroid size normal.  No goiter or nodules appreciated. No thyroid bruit.  Lungs: Clear with  good BS bilat with no rales, rhonchi, or wheezes  Heart: Auscultation: RRR.  Abdomen: Normoactive bowel sounds, soft, nontender, without masses or organomegaly palpable  Extremities:  BL LE: No pretibial edema normal ROM and strength.  Skin: Hair: Texture and amount normal with gender appropriate distribution Skin Inspection: No rashes but has multiple ecchymotic lesions on LE Skin Palpation: Skin temperature, texture, and thickness normal to palpation  Neuro: Cranial nerves: II - XII grossly intact  Motor: Normal strength throughout DTRs: 2+ and symmetric in UE without delay in relaxation phase  Mental Status: Judgment, insight: Intact Orientation: Oriented to time, place, and person Mood and affect: No depression, anxiety, or agitation     DATA REVIEWED:   Results for ORELIA, BRANDSTETTER (MRN [id number]) as of 01/16/2021 16:14  Ref. Range 01/16/2021 14:16  Sodium Latest Ref Range: 135 - 145 mEq/L 142  Potassium Latest Ref Range: 3.5 - 5.1 mEq/L 4.5  Chloride Latest Ref Range: 96 - 112 mEq/L 104  CO2 Latest Ref Range: 19 - 32 mEq/L 30  Glucose Latest Ref Range: 70 - 99 mg/dL 92  BUN Latest Ref Range: 6 - 23 mg/dL 21  Creatinine Latest Ref Range: 0.40 - 1.20 mg/dL 0.86  Calcium Latest Ref Range: 8.4 - 10.5 mg/dL 10.3  Alkaline Phosphatase Latest Ref Range: 39 - 117 U/L 69  Albumin Latest Ref Range: 3.5 - 5.2 g/dL 4.3  AST Latest Ref Range: 0 - 37 U/L 22  ALT Latest Ref Range: 0 - 35 U/L 25  Total Protein Latest Ref Range: 6.0 - 8.3 g/dL 6.8  Total Bilirubin Latest Ref Range: 0.2 - 1.2 mg/dL 1.1  GFR Latest Ref Range: >60.00 mL/min 66.66  FSH Latest Units: mIU/ML  77.0  TSH Latest Ref Range: 0.35 - 4.50 uIU/mL 0.01 (L)  T4,Free(Direct) Latest Ref Range: 0.60 - 1.60 ng/dL 2.39 (H)   Results for BEVERELY, SUEN (MRN [id number]) as of 01/19/2021 12:51  Ref. 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 I have encouraged her to seek pain management. - Currently on physiologic dose of HC - Discussed sick day rule - She has not had any hydrocortisone since yesterday afternoon, will proceed with cortisol and ACTH check  - She was also provided with printed orders for ACTH and cortisol to be done locally, she was advised to hold the afternoon dose of HC the prior day and to hold off on taking the AM dose of HC that day but to take it immediately following blood draw, this will need to be done at 8 AM fasting  - FSH is elevated indicating normal pituitary function  - ACTH continues to be undetectable, will reduce morning HC dose as below, she will have ACTH and cortisol repeated in 6 weeks     Medications : Decrease Hydrocortisone 10 mg, 1 tablet  with Breakfast and 0.5 tab in the afternoon      2. Abnormal Thyroid tests:   - This could be secondary to essay interference due to biotin intake  - She was advised to hold it for 3 days prior to next TFT check. - An order will be faxed to [facility] in [place]  Addendum: lab results discussed with the pt on 01/16/2021 at 1615 Pt to adjust HC dose and recheck ACTH and cortisol levels in 6 weeks 01/19/2021  F/U in 3 months     Signed electronically by: Mack Guise, MD  [facility] Endocrinology  [facility] [address], [place]  [address] Phone: [phone] FAX: [phone]   CC: Cher Nakai, MD [address] [place] [address] Phone: [phone] Fax: [phone]   Return to Endocrinology clinic as below: Future Appointments  Date Time Provider [place]  01/21/2021  2:15 PM Landis Martins, DPM [facility]  02/26/2021  2:00 PM Marcial Pacas, MD [facility] None

## 2021-01-17 LAB — ACTH: ACTH: <1.5 pg/mL — ABNORMAL LOW (ref 7.2–63.3)

## 2021-01-17 LAB — CORTISOL: Cortisol: 0.3 ug/dL

## 2021-01-19 ENCOUNTER — Telehealth: Payer: Self-pay | Admitting: *Deleted

## 2021-01-19 NOTE — Telephone Encounter (Signed)
Pt received her vaccines at [facility] 1st does on 01-13-2020, 2nd dose 02-02-2020 and her booster also they did not put date of her pfizer booster on her card

## 2021-01-20 ENCOUNTER — Telehealth: Payer: Self-pay | Admitting: Cardiology

## 2021-01-20 ENCOUNTER — Other Ambulatory Visit: Payer: Self-pay

## 2021-01-20 DIAGNOSIS — E2749 Other adrenocortical insufficiency: Secondary | ICD-10-CM | POA: Diagnosis not present

## 2021-01-20 NOTE — Telephone Encounter (Signed)
Spoke to the patient just now and went over Dr. Joya Gaskins result notes from her heart monitor with her again. She verbalizes understanding and thanks me for the call back.

## 2021-01-20 NOTE — Telephone Encounter (Signed)
New Message:       Pt said Dr Bettina Gavia told her to stop one of her medicine and she wants to know which onecine it is please.

## 2021-01-21 ENCOUNTER — Ambulatory Visit (INDEPENDENT_AMBULATORY_CARE_PROVIDER_SITE_OTHER): Payer: Medicare Other | Admitting: Sports Medicine

## 2021-01-21 ENCOUNTER — Other Ambulatory Visit: Payer: Self-pay

## 2021-01-21 ENCOUNTER — Other Ambulatory Visit: Payer: Self-pay | Admitting: *Deleted

## 2021-01-21 ENCOUNTER — Telehealth: Payer: Self-pay | Admitting: Internal Medicine

## 2021-01-21 ENCOUNTER — Encounter: Payer: Self-pay | Admitting: Sports Medicine

## 2021-01-21 DIAGNOSIS — K589 Irritable bowel syndrome without diarrhea: Secondary | ICD-10-CM | POA: Diagnosis not present

## 2021-01-21 DIAGNOSIS — M2040 Other hammer toe(s) (acquired), unspecified foot: Secondary | ICD-10-CM

## 2021-01-21 DIAGNOSIS — M545 Low back pain, unspecified: Secondary | ICD-10-CM | POA: Diagnosis not present

## 2021-01-21 DIAGNOSIS — E876 Hypokalemia: Secondary | ICD-10-CM | POA: Diagnosis not present

## 2021-01-21 DIAGNOSIS — E2740 Unspecified adrenocortical insufficiency: Secondary | ICD-10-CM | POA: Diagnosis not present

## 2021-01-21 DIAGNOSIS — K219 Gastro-esophageal reflux disease without esophagitis: Secondary | ICD-10-CM | POA: Diagnosis not present

## 2021-01-21 DIAGNOSIS — F988 Other specified behavioral and emotional disorders with onset usually occurring in childhood and adolescence: Secondary | ICD-10-CM | POA: Diagnosis not present

## 2021-01-21 DIAGNOSIS — L853 Xerosis cutis: Secondary | ICD-10-CM | POA: Diagnosis not present

## 2021-01-21 DIAGNOSIS — R251 Tremor, unspecified: Secondary | ICD-10-CM | POA: Diagnosis not present

## 2021-01-21 DIAGNOSIS — E2749 Other adrenocortical insufficiency: Secondary | ICD-10-CM

## 2021-01-21 DIAGNOSIS — F3341 Major depressive disorder, recurrent, in partial remission: Secondary | ICD-10-CM | POA: Diagnosis not present

## 2021-01-21 DIAGNOSIS — R609 Edema, unspecified: Secondary | ICD-10-CM | POA: Diagnosis not present

## 2021-01-21 DIAGNOSIS — E0590 Thyrotoxicosis, unspecified without thyrotoxic crisis or storm: Secondary | ICD-10-CM

## 2021-01-21 DIAGNOSIS — Z139 Encounter for screening, unspecified: Secondary | ICD-10-CM | POA: Diagnosis not present

## 2021-01-21 DIAGNOSIS — R0600 Dyspnea, unspecified: Secondary | ICD-10-CM | POA: Diagnosis not present

## 2021-01-21 DIAGNOSIS — M79671 Pain in right foot: Secondary | ICD-10-CM | POA: Diagnosis not present

## 2021-01-21 DIAGNOSIS — J309 Allergic rhinitis, unspecified: Secondary | ICD-10-CM | POA: Diagnosis not present

## 2021-01-21 LAB — TSH: TSH: 0.013 u[IU]/mL — ABNORMAL LOW (ref 0.450–4.500)

## 2021-01-21 LAB — CORTISOL: Cortisol: 0.6 ug/dL

## 2021-01-21 LAB — ACTH: ACTH: <1.5 pg/mL — ABNORMAL LOW (ref 7.2–63.3)

## 2021-01-21 LAB — T4, FREE: Free T4: 2.23 ng/dL — ABNORMAL HIGH (ref 0.82–1.77)

## 2021-01-21 MED ORDER — METHIMAZOLE 5 MG PO TABS: 5.0000 mg | ORAL_TABLET | Freq: Every day | ORAL | 1 refills | Status: DC

## 2021-01-21 NOTE — Telephone Encounter (Signed)
Discussed abnormal thyroid results as below , she has been off Biotin for ~ 4 days    Results for Meghan Tucker, Meghan Tucker (MRN [id number]) as of 01/21/2021 11:38  Ref. Range 01/20/2021 09:59  TSH Latest Ref Range: 0.450 - 4.500 uIU/mL 0.013 (L)  T4,Free(Direct) Latest Ref Range: 0.82 - 1.77 ng/dL 2.23 (H)     Medications Start methimazole 5 mg 2 tabs daily  Continue HC 10 mg QAM  Continue HC 10 mg, half a tablet in the afternoon     Pt will have labs in 6 weeks at [place] ( TSH, TRAb, ACTH

## 2021-01-21 NOTE — Progress Notes (Signed)
Subjective: Meghan Tucker is a 75 y.o. female patient seen today in office with complaint of dry skin especially on right heel and numbness and tingling to toes history of sciatica and was recent diagnosis with Adrenal insufficiency and hypothyrodisim. Patient has no other pedal complaints at this time.   Patient Active Problem List   Diagnosis Date Noted  . Abnormal thyroid function test 01/16/2021  . Secondary adrenal insufficiency ([place]) 01/16/2021  . Hypertension   . Tingling   . Sciatica   . Insomnia   . Essential hypertension   . Hyperlipidemia   . History of migraine   . History of kidney stones   . History of hiatal hernia   . Diverticulosis   . Chronic back pain   . Arthritis   . IBS (irritable bowel syndrome) 10/31/2020  . Blepharitis of upper and lower eyelids of both eyes 07/11/2018  . Dermatochalasis of both upper eyelids 04/04/2018  . Bilateral posterior capsular opacification 05/03/2017  . Dry eye 05/03/2017  . Presbyopia of both eyes 05/03/2017  . Pseudophakia of both eyes 05/03/2017  . Corn of toe 01/19/2017  . Hammertoe of left foot 01/19/2017  . Spondylolisthesis of lumbar region 10/26/2016  . Myalgia 08/23/2014  . Asthma 08/22/2014  . CTS (carpal tunnel syndrome) 08/22/2014  . Migraines 08/22/2014  . Morton's neuroma 08/22/2014  . Osteopenia 08/22/2014  . Shortness of breath 08/22/2014  . Vitamin D deficiency 08/22/2014  . Nausea alone 10/12/2013  . GERD (gastroesophageal reflux disease) 10/12/2013    Current Outpatient Medications on File Prior to Visit  Medication Sig Dispense Refill  . acebutolol (SECTRAL) 200 MG capsule Take 1 capsule (200 mg total) by mouth 2 (two) times daily as needed. 60 capsule 2  . AMBULATORY NON FORMULARY MEDICATION 45 mg every morning. Protonics    . Apoaequorin (PREVAGEN PO) Take 1 tablet by mouth daily.    . Biotin 5 MG CAPS Take 5 mg by mouth daily.    Marland Kitchen CALCIUM CITRATE PO Take 1 tablet by mouth every other day.    .  celecoxib (CELEBREX) 200 MG capsule Take 200 mg by mouth 2 (two) times daily.    . cetirizine (ZYRTEC) 10 MG tablet Take 10 mg by mouth daily.    . citalopram (CELEXA) 10 MG tablet TAKE 1 TABLET ONCE DAILY 90 tablet 3  . denosumab (PROLIA) 60 MG/ML SOLN injection Inject 60 mg into the skin every 6 (six) months. Twice a year    . dicyclomine (BENTYL) 10 MG capsule Take 1 capsule (10 mg total) by mouth 2 (two) times daily. 60 capsule 3  . famotidine (PEPCID) 20 MG tablet Take 1 tablet (20 mg total) by mouth at bedtime. 30 tablet 11  . hydrocortisone (CORTEF) 10 MG tablet Take 10 mg by mouth. TAKE 1.5 TABLET ( 15 MG ) AM AND 1 TABLET 10 MG AFTERNOON    . hydrOXYzine (ATARAX/VISTARIL) 10 MG tablet Take 10 mg by mouth 2 (two) times daily.    . Inulin (FIBER CHOICE PO) Take by mouth every morning.    . pantoprazole (PROTONIX) 40 MG tablet Take 1 tablet (40 mg total) by mouth daily. 30 tablet 11  . potassium chloride (KLOR-CON) 10 MEQ tablet TAKE 1 TABLET ONCE DAILY. 90 tablet 2  . Probiotic Product (ALIGN PO) Take 1 tablet by mouth daily.     . Psyllium (METAMUCIL PO) Take by mouth every morning. 3 in 1 fiber    . rosuvastatin (CRESTOR) 10 MG tablet Take  1 tablet (10 mg total) by mouth daily. 90 tablet 3  . Vitamin D, Ergocalciferol, (DRISDOL) 1.25 MG (50000 UNIT) CAPS capsule TAKE 1 CAPSULE ONCE A WEEK. 12 capsule 0  . zolpidem (AMBIEN CR) 12.5 MG CR tablet Take 12.5 mg by mouth at bedtime.     No current facility-administered medications on file prior to visit.    Allergies  Allergen Reactions  . Atorvastatin Other (See Comments)    MYALGIAS MYALGIAS Other reaction(s): Myalgias (intolerance), Other, Other (See Comments) MYALGIAS MYALGIAS MYALGIAS  . Rosuvastatin Other (See Comments)    MYALGIAS MYALGIAS Other reaction(s): Myalgias (intolerance), Other, Other (See Comments) MYALGIAS MYALGIAS MYALGIAS    Objective: Physical Exam  General: Well developed, nourished, no acute  distress, awake, alert and oriented x 3  Vascular: Dorsalis pedis artery 1/4 bilateral, Posterior tibial artery 1/4 bilateral, skin temperature warm to warm proximal to distal bilateral lower extremities, mild varicosities, pedal hair present bilateral.  Neurological: Gross sensation present via light touch bilateral. Subjective tingling history of sciatica.  Dermatological: Skin is warm, dry, and supple bilateral, Nails 1-10 are short, thick, and discolored with mild subungal debris, no webspace macerations present bilateral, no open lesions present bilateral, + hyperkeratotic tissue present bilateral at toes and worse on right heel. No signs of infection bilateral.  Musculoskeletal: Asymptomatic boney hammertoe deformities noted bilateral. Muscular strength within normal limits without pain on range of motion. No pain with calf compression bilateral.  Assessment and Plan:  Problem List Items Addressed This Visit   None   Visit Diagnoses    Dry skin    -  Primary   Pain of right heel       Hammer toe, unspecified laterality          -Examined patient.  -Discussed treatment options for dry skin -Recommend vasaline at bedtime and foot miracle cream in the AM -Dispensed heel lifts for patient to use as instructed to reduce shear pressure -Advised good supportive shoes daily for foot type -Patient to return as needed or sooner if symptoms worsen.  Landis Martins, DPM

## 2021-01-22 NOTE — Addendum Note (Signed)
Addended by: [place] Mon R on: 01/22/2021 01:15 PM   Modules accepted: Orders

## 2021-01-23 DIAGNOSIS — M5459 Other low back pain: Secondary | ICD-10-CM | POA: Diagnosis not present

## 2021-01-23 DIAGNOSIS — M62521 Muscle wasting and atrophy, not elsewhere classified, right upper arm: Secondary | ICD-10-CM | POA: Diagnosis not present

## 2021-01-23 DIAGNOSIS — E2740 Unspecified adrenocortical insufficiency: Secondary | ICD-10-CM | POA: Diagnosis not present

## 2021-01-23 DIAGNOSIS — M62552 Muscle wasting and atrophy, not elsewhere classified, left thigh: Secondary | ICD-10-CM | POA: Diagnosis not present

## 2021-01-23 DIAGNOSIS — M62551 Muscle wasting and atrophy, not elsewhere classified, right thigh: Secondary | ICD-10-CM | POA: Diagnosis not present

## 2021-01-23 DIAGNOSIS — M6281 Muscle weakness (generalized): Secondary | ICD-10-CM | POA: Diagnosis not present

## 2021-01-29 DIAGNOSIS — M62552 Muscle wasting and atrophy, not elsewhere classified, left thigh: Secondary | ICD-10-CM | POA: Diagnosis not present

## 2021-01-29 DIAGNOSIS — M62521 Muscle wasting and atrophy, not elsewhere classified, right upper arm: Secondary | ICD-10-CM | POA: Diagnosis not present

## 2021-01-29 DIAGNOSIS — E2740 Unspecified adrenocortical insufficiency: Secondary | ICD-10-CM | POA: Diagnosis not present

## 2021-01-29 DIAGNOSIS — M62551 Muscle wasting and atrophy, not elsewhere classified, right thigh: Secondary | ICD-10-CM | POA: Diagnosis not present

## 2021-01-29 DIAGNOSIS — M5459 Other low back pain: Secondary | ICD-10-CM | POA: Diagnosis not present

## 2021-01-29 DIAGNOSIS — M6281 Muscle weakness (generalized): Secondary | ICD-10-CM | POA: Diagnosis not present

## 2021-02-03 DIAGNOSIS — M62551 Muscle wasting and atrophy, not elsewhere classified, right thigh: Secondary | ICD-10-CM | POA: Diagnosis not present

## 2021-02-03 DIAGNOSIS — E2740 Unspecified adrenocortical insufficiency: Secondary | ICD-10-CM | POA: Diagnosis not present

## 2021-02-03 DIAGNOSIS — M6281 Muscle weakness (generalized): Secondary | ICD-10-CM | POA: Diagnosis not present

## 2021-02-03 DIAGNOSIS — M62552 Muscle wasting and atrophy, not elsewhere classified, left thigh: Secondary | ICD-10-CM | POA: Diagnosis not present

## 2021-02-03 DIAGNOSIS — M62521 Muscle wasting and atrophy, not elsewhere classified, right upper arm: Secondary | ICD-10-CM | POA: Diagnosis not present

## 2021-02-03 DIAGNOSIS — M5459 Other low back pain: Secondary | ICD-10-CM | POA: Diagnosis not present

## 2021-02-05 DIAGNOSIS — M62552 Muscle wasting and atrophy, not elsewhere classified, left thigh: Secondary | ICD-10-CM | POA: Diagnosis not present

## 2021-02-05 DIAGNOSIS — M62521 Muscle wasting and atrophy, not elsewhere classified, right upper arm: Secondary | ICD-10-CM | POA: Diagnosis not present

## 2021-02-05 DIAGNOSIS — M6281 Muscle weakness (generalized): Secondary | ICD-10-CM | POA: Diagnosis not present

## 2021-02-05 DIAGNOSIS — M62551 Muscle wasting and atrophy, not elsewhere classified, right thigh: Secondary | ICD-10-CM | POA: Diagnosis not present

## 2021-02-05 DIAGNOSIS — M5459 Other low back pain: Secondary | ICD-10-CM | POA: Diagnosis not present

## 2021-02-05 DIAGNOSIS — E2740 Unspecified adrenocortical insufficiency: Secondary | ICD-10-CM | POA: Diagnosis not present

## 2021-02-10 DIAGNOSIS — E2740 Unspecified adrenocortical insufficiency: Secondary | ICD-10-CM | POA: Diagnosis not present

## 2021-02-10 DIAGNOSIS — M62551 Muscle wasting and atrophy, not elsewhere classified, right thigh: Secondary | ICD-10-CM | POA: Diagnosis not present

## 2021-02-10 DIAGNOSIS — M5459 Other low back pain: Secondary | ICD-10-CM | POA: Diagnosis not present

## 2021-02-10 DIAGNOSIS — M62552 Muscle wasting and atrophy, not elsewhere classified, left thigh: Secondary | ICD-10-CM | POA: Diagnosis not present

## 2021-02-10 DIAGNOSIS — M62521 Muscle wasting and atrophy, not elsewhere classified, right upper arm: Secondary | ICD-10-CM | POA: Diagnosis not present

## 2021-02-10 DIAGNOSIS — M6281 Muscle weakness (generalized): Secondary | ICD-10-CM | POA: Diagnosis not present

## 2021-02-12 DIAGNOSIS — M6281 Muscle weakness (generalized): Secondary | ICD-10-CM | POA: Diagnosis not present

## 2021-02-12 DIAGNOSIS — M62552 Muscle wasting and atrophy, not elsewhere classified, left thigh: Secondary | ICD-10-CM | POA: Diagnosis not present

## 2021-02-12 DIAGNOSIS — M62551 Muscle wasting and atrophy, not elsewhere classified, right thigh: Secondary | ICD-10-CM | POA: Diagnosis not present

## 2021-02-12 DIAGNOSIS — M5459 Other low back pain: Secondary | ICD-10-CM | POA: Diagnosis not present

## 2021-02-12 DIAGNOSIS — M62521 Muscle wasting and atrophy, not elsewhere classified, right upper arm: Secondary | ICD-10-CM | POA: Diagnosis not present

## 2021-02-12 DIAGNOSIS — E2740 Unspecified adrenocortical insufficiency: Secondary | ICD-10-CM | POA: Diagnosis not present

## 2021-02-18 DIAGNOSIS — M818 Other osteoporosis without current pathological fracture: Secondary | ICD-10-CM | POA: Diagnosis not present

## 2021-02-18 DIAGNOSIS — R609 Edema, unspecified: Secondary | ICD-10-CM | POA: Diagnosis not present

## 2021-02-18 DIAGNOSIS — M62521 Muscle wasting and atrophy, not elsewhere classified, right upper arm: Secondary | ICD-10-CM | POA: Diagnosis not present

## 2021-02-18 DIAGNOSIS — J309 Allergic rhinitis, unspecified: Secondary | ICD-10-CM | POA: Diagnosis not present

## 2021-02-18 DIAGNOSIS — E2740 Unspecified adrenocortical insufficiency: Secondary | ICD-10-CM | POA: Diagnosis not present

## 2021-02-18 DIAGNOSIS — F3341 Major depressive disorder, recurrent, in partial remission: Secondary | ICD-10-CM | POA: Diagnosis not present

## 2021-02-18 DIAGNOSIS — M545 Low back pain, unspecified: Secondary | ICD-10-CM | POA: Diagnosis not present

## 2021-02-18 DIAGNOSIS — E876 Hypokalemia: Secondary | ICD-10-CM | POA: Diagnosis not present

## 2021-02-18 DIAGNOSIS — M5459 Other low back pain: Secondary | ICD-10-CM | POA: Diagnosis not present

## 2021-02-18 DIAGNOSIS — R0600 Dyspnea, unspecified: Secondary | ICD-10-CM | POA: Diagnosis not present

## 2021-02-18 DIAGNOSIS — M62551 Muscle wasting and atrophy, not elsewhere classified, right thigh: Secondary | ICD-10-CM | POA: Diagnosis not present

## 2021-02-18 DIAGNOSIS — M62552 Muscle wasting and atrophy, not elsewhere classified, left thigh: Secondary | ICD-10-CM | POA: Diagnosis not present

## 2021-02-18 DIAGNOSIS — M6281 Muscle weakness (generalized): Secondary | ICD-10-CM | POA: Diagnosis not present

## 2021-02-18 DIAGNOSIS — R251 Tremor, unspecified: Secondary | ICD-10-CM | POA: Diagnosis not present

## 2021-02-18 DIAGNOSIS — F988 Other specified behavioral and emotional disorders with onset usually occurring in childhood and adolescence: Secondary | ICD-10-CM | POA: Diagnosis not present

## 2021-02-18 DIAGNOSIS — K589 Irritable bowel syndrome without diarrhea: Secondary | ICD-10-CM | POA: Diagnosis not present

## 2021-02-18 DIAGNOSIS — K219 Gastro-esophageal reflux disease without esophagitis: Secondary | ICD-10-CM | POA: Diagnosis not present

## 2021-02-19 DIAGNOSIS — E2740 Unspecified adrenocortical insufficiency: Secondary | ICD-10-CM | POA: Diagnosis not present

## 2021-02-19 DIAGNOSIS — M6281 Muscle weakness (generalized): Secondary | ICD-10-CM | POA: Diagnosis not present

## 2021-02-19 DIAGNOSIS — M62521 Muscle wasting and atrophy, not elsewhere classified, right upper arm: Secondary | ICD-10-CM | POA: Diagnosis not present

## 2021-02-19 DIAGNOSIS — M62551 Muscle wasting and atrophy, not elsewhere classified, right thigh: Secondary | ICD-10-CM | POA: Diagnosis not present

## 2021-02-19 DIAGNOSIS — M5459 Other low back pain: Secondary | ICD-10-CM | POA: Diagnosis not present

## 2021-02-19 DIAGNOSIS — M62552 Muscle wasting and atrophy, not elsewhere classified, left thigh: Secondary | ICD-10-CM | POA: Diagnosis not present

## 2021-02-26 ENCOUNTER — Encounter: Payer: Self-pay | Admitting: Neurology

## 2021-02-26 ENCOUNTER — Other Ambulatory Visit: Payer: Self-pay

## 2021-02-26 ENCOUNTER — Ambulatory Visit (INDEPENDENT_AMBULATORY_CARE_PROVIDER_SITE_OTHER): Payer: Medicare Other | Admitting: Neurology

## 2021-02-26 VITALS — BP 150/99 | HR 68 | Ht 65.0 in | Wt 128.4 lb

## 2021-02-26 DIAGNOSIS — R269 Unspecified abnormalities of gait and mobility: Secondary | ICD-10-CM | POA: Diagnosis not present

## 2021-02-26 DIAGNOSIS — R251 Tremor, unspecified: Secondary | ICD-10-CM | POA: Diagnosis not present

## 2021-02-26 DIAGNOSIS — M5416 Radiculopathy, lumbar region: Secondary | ICD-10-CM | POA: Diagnosis not present

## 2021-02-26 MED ORDER — DULOXETINE HCL 20 MG PO CPEP: 20.0000 mg | ORAL_CAPSULE | Freq: Every day | ORAL | 6 refills | Status: DC

## 2021-02-26 NOTE — Progress Notes (Signed)
Chief Complaint  Patient presents with  . New Patient (Initial Visit)    Reports intermittent tremors in her right hand and right leg. She rarely has any issues on the left side except when putting on mascara with her left hand. Symptoms are worse in the morning. She is not able to balance on her right leg.    HISTORICAL  Meghan Tucker is a 75 year old female, seen in request by her primary care physician Dr. Truman Hayward, Joylene Igo for evaluation of right upper and lower extremity tremor, right low back pain, initial evaluation is on February 26, 2021.  I reviewed and summarized the referring note.  Past medical history Hyperlipidemia Depression History of kidney stone Chronic insomnia, take Ambien CR 12.5 mg every night Adrenal insufficiency.  Patient was previously quite healthy, very active all her life, busy in her house chores, enjoys cooking, September 2021, she came down with extreme fatigue, lethargy, body achy pain, eventually was diagnosed with adrenal insufficiency  CT of abdomen/pelvis with contrast November 2021 showed no acute abnormality  She was started on hydrocortisone treatment since November 2021, was also diagnosed with hyperthyroidism, taking Tapazole 5 mg daily, she feels much better afterwards  Shortly afterwards she began to notice intermittent bilateral hands posturing tremor, she denies significant hand pain, no sensory loss, no weakness, no limitation in her activity, she felt mild right hand shaky when she holding utensils, or holding a phone, occasionally left hand involvement, she noticed that when she tried to put on mascara on her eyes.  She also complains of chronic neck pain, had a history of lumbar decompression surgery in the past, prior to the surgery, she has significant right lumbar radicular pain, surgery did help her symptoms, she no longer have significant pain, but still has occasionally burning sensation especially on the right foot, mild weakness at distal  right leg, difficult to hold her weight along with right foot.  There was no family history of tremor  Personally reviewed MRI of lumbar spine: 1. L2-3: Moderate multifactorial spinal stenosis, worsened since 2018. Neural compression could occur at this level. Shallow disc protrusion. Facet and ligamentous hypertrophy. Mild bilateral foraminal stenosis as well. 2. L3-4: Multifactorial spinal stenosis, similar to the previous study. Neural compression could occur at this level. 3. L4-5: Previous PLIF. No complicating feature. 4. L5-S1: Chronic disc degeneration and facet degeneration. Mild stenosis of the subarticular lateral recesses and neural foramina, similar to the previous study.  MRI cervical spine 1. Multilevel degenerative changes of the cervical spine as described above, overall similar to prior study. Unchanged severe right and moderate to severe left neuroforaminal stenosis at C5-C6. 2. Unchanged moderate to severe right and moderate left neuroforaminal stenosis at C6-C7. 3. Unchanged moderate neuroforaminal stenosis at C3-C4 and C4-C5.   Laboratory evaluation in January 2022: Normal cortisone 0.6, ACTH was less than 1.5, decreased TSH 0.013, free T4 was elevated 2.23, CMP was within normal limit creatinine 0.86, FSH 77  REVIEW OF SYSTEMS: Full 14 system review of systems performed and notable only for as above All other review of systems were negative.  ALLERGIES: Allergies  Allergen Reactions  . Atorvastatin Other (See Comments)    MYALGIAS MYALGIAS Other reaction(s): Myalgias (intolerance), Other, Other (See Comments) MYALGIAS MYALGIAS MYALGIAS  . Rosuvastatin Other (See Comments)    MYALGIAS MYALGIAS Other reaction(s): Myalgias (intolerance), Other, Other (See Comments) MYALGIAS MYALGIAS MYALGIAS    HOME MEDICATIONS: Current Outpatient Medications  Medication Sig Dispense Refill  . acebutolol (SECTRAL) 200 MG capsule  Take 1 capsule (200 mg total) by  mouth 2 (two) times daily as needed. 60 capsule 2  . Apoaequorin (PREVAGEN PO) Take 1 tablet by mouth daily. Prevagen    . CALCIUM CITRATE PO Take 1 tablet by mouth every other day.    . celecoxib (CELEBREX) 200 MG capsule Take 200 mg by mouth 2 (two) times daily.    . cetirizine (ZYRTEC) 10 MG tablet Take 10 mg by mouth at bedtime.    . citalopram (CELEXA) 10 MG tablet TAKE 1 TABLET ONCE DAILY 90 tablet 3  . dicyclomine (BENTYL) 10 MG capsule Take 1 capsule (10 mg total) by mouth 2 (two) times daily. 60 capsule 3  . FIBER PO Take 1 tablet by mouth daily.    . folic acid (FOLVITE) 1 MG tablet Take 1 mg by mouth daily.    . hydrocortisone (CORTEF) 10 MG tablet Take 10 mg by mouth daily.    . hydrOXYzine (ATARAX/VISTARIL) 10 MG tablet Take 10 mg by mouth 2 (two) times daily.    Marland Kitchen METAMUCIL FIBER PO Take 2 tablets by mouth daily.    . methimazole (TAPAZOLE) 5 MG tablet Take 1 tablet (5 mg total) by mouth daily. 90 tablet 1  . pantoprazole (PROTONIX) 40 MG tablet Take 1 tablet (40 mg total) by mouth daily. 30 tablet 11  . POTASSIUM CHLORIDE ER PO Take 10 mEq by mouth daily.    . Probiotic Product (ALIGN PO) Take by mouth.    . Psyllium (METAMUCIL PO) Take by mouth every morning. 3 in 1 fiber    . rosuvastatin (CRESTOR) 10 MG tablet Take 1 tablet (10 mg total) by mouth daily. 90 tablet 3  . Vitamin D, Ergocalciferol, (DRISDOL) 1.25 MG (50000 UNIT) CAPS capsule TAKE 1 CAPSULE ONCE A WEEK. 12 capsule 0  . zolpidem (AMBIEN CR) 12.5 MG CR tablet Take 12.5 mg by mouth at bedtime.     No current facility-administered medications for this visit.    PAST MEDICAL HISTORY: Past Medical History:  Diagnosis Date  . Adrenal insufficiency ([place])   . Arthritis    "all over"  . Chronic back pain    2 buldging disc,stenosis  . Diverticulosis   . Essential hypertension    takes Metoprolol daily  . GERD (gastroesophageal reflux disease)    takes Omeprazole daily  . History of hiatal hernia   . History  of kidney stones   . History of kidney stones   . History of migraine    none since menopause yrs ago  . Hyperlipidemia    takes Fish Oil daily  . Hypertension    takes Metoprolol daily  . Insomnia    takes Ambien nightly  . Sciatica   . Sciatica   . Thyroid disease   . Tingling    right arm.Both feet   . Tremor     PAST SURGICAL HISTORY: Past Surgical History:  Procedure Laterality Date  . CARPAL TUNNEL RELEASE Left   . cataract surgery Bilateral   . COLONOSCOPY     Dr. Jerilynn Mages in [place]  . ESOPHAGOGASTRODUODENOSCOPY     Dr Jerilynn Mages in [place]  . ESOPHAGOGASTRODUODENOSCOPY (EGD) WITH ESOPHAGEAL DILATION  05/14/2015   with Propofol  . ESOPHAGOGASTRODUODENOSCOPY (EGD) WITH PROPOFOL  10/19/2013  . GANGLION CYST EXCISION    . HAMMERTOE RECONSTRUCTION WITH WEIL OSTEOTOMY Left 07/17/2015   Procedure: LEFT 2-3 WEIL OSTEOTOMY AND HAMMERTOE CORRECTION ;  Surgeon: [name], MD;  Location: [facility];  Service:  Orthopedics;  Laterality: Left;    FAMILY HISTORY: Family History  Problem Relation Age of Onset  . Heart disease Father   . Heart attack Father 57  . Stroke Mother 25  . Colon cancer Neg Hx   . Esophageal cancer Neg Hx   . Cancer Neg Hx     SOCIAL HISTORY: Social History   Socioeconomic History  . Marital status: Married    Spouse name: Not on file  . Number of children: 2  . Years of education: two years of college  . Highest education level: Not on file  Occupational History  . Occupation: Retired  Tobacco Use  . Smoking status: Never Smoker  . Smokeless tobacco: Never Used  Vaping Use  . Vaping Use: Never used  Substance and Sexual Activity  . Alcohol use: Yes    Comment: 1-2 glasses champagne nightly  . Drug use: Never  . Sexual activity: Not on file  Other Topics Concern  . Not on file  Social History Narrative   Lives at home with her husband.   Left-handed.   Small can of Red Bull daily (does not always finish).   Social  Determinants of Health   Financial Resource Strain: Not on file  Food Insecurity: Not on file  Transportation Needs: Not on file  Physical Activity: Not on file  Stress: Not on file  Social Connections: Not on file  Intimate Partner Violence: Not on file     PHYSICAL EXAM   Vitals:   02/26/21 1349  BP: (!) 150/99  Pulse: 68  Weight: 128 lb 6.4 oz (58.2 kg)  Height: 5\' 5"  (1.651 m)   Not recorded     Body mass index is 21.37 kg/m.  PHYSICAL EXAMNIATION:  Gen: NAD, conversant, well nourised, well groomed                     Cardiovascular: Regular rate rhythm, no peripheral edema, warm, nontender. Eyes: Conjunctivae clear without exudates or hemorrhage Neck: Supple, no carotid bruits. Pulmonary: Clear to auscultation bilaterally   NEUROLOGICAL EXAM:  MENTAL STATUS: Speech:    Speech is normal; fluent and spontaneous with normal comprehension.  Cognition:     Orientation to time, place and person     Normal recent and remote memory     Normal Attention span and concentration     Normal Language, naming, repeating,spontaneous speech     Fund of knowledge   CRANIAL NERVES: CN II: Visual fields are full to confrontation. Pupils are round equal and briskly reactive to light. CN III, IV, VI: extraocular movement are normal. No ptosis. CN V: Facial sensation is intact to light touch CN VII: Face is symmetric with normal eye closure  CN VIII: Hearing is normal to causal conversation. CN IX, X: Phonation is normal. CN XI: Head turning and shoulder shrug are intact  MOTOR: Mild bilateral hand posturing tremor, no weakness, no rigidity bradykinesia, mild right toe flexion extension weakness  REFLEXES: Reflexes are 2+ and symmetric at the biceps, triceps, knees, and absent at ankles. Plantar responses are flexor.  SENSORY: Intact to light touch, pinprick and vibratory sensation are intact in fingers and toes.  COORDINATION: There is no trunk or limb dysmetria  noted.  GAIT/STANCE: She can get up from seated position arm crossed, steady  DIAGNOSTIC DATA (LABS, IMAGING, TESTING) - I reviewed patient records, labs, notes, testing and imaging myself where available.   ASSESSMENT AND PLAN  Fortune Torosian is a 75  y.o. female   History of cervical, lumbar degenerative changes, lumbar decompression surgery, now presenting with right-sided low back pain, radiating pain to right lower extremity,  Possible right lumbar radiculopathy  Try Cymbalta 20 mg daily  EMG nerve conduction study  Tremor of bilateral hands,  Mostly action component, could be related to her hyperthyroidism       Marcial Pacas, M.D. Ph.D.  [facility] [address], [place], [place] [address] Ph: [phone] Fax: [phone]  CC:  Cher Nakai, MD [address] [place],  [place] [address]

## 2021-03-03 DIAGNOSIS — M62551 Muscle wasting and atrophy, not elsewhere classified, right thigh: Secondary | ICD-10-CM | POA: Diagnosis not present

## 2021-03-03 DIAGNOSIS — M62552 Muscle wasting and atrophy, not elsewhere classified, left thigh: Secondary | ICD-10-CM | POA: Diagnosis not present

## 2021-03-03 DIAGNOSIS — M62521 Muscle wasting and atrophy, not elsewhere classified, right upper arm: Secondary | ICD-10-CM | POA: Diagnosis not present

## 2021-03-03 DIAGNOSIS — E2740 Unspecified adrenocortical insufficiency: Secondary | ICD-10-CM | POA: Diagnosis not present

## 2021-03-03 DIAGNOSIS — M6281 Muscle weakness (generalized): Secondary | ICD-10-CM | POA: Diagnosis not present

## 2021-03-03 DIAGNOSIS — M5459 Other low back pain: Secondary | ICD-10-CM | POA: Diagnosis not present

## 2021-03-04 ENCOUNTER — Telehealth: Payer: Self-pay

## 2021-03-04 NOTE — Telephone Encounter (Signed)
Informed Meghan Tucker with [facility] that Dr Bettina Gavia prefers that patient stays on Rosuvastatin. Meghan Tucker will inform patient.

## 2021-03-04 NOTE — Telephone Encounter (Signed)
Received faxed from [place] stating that patients insurance prefers other drugs besides the Rosuvastatin 10 mg, such as Atorvastatin, Simvastatin or Pravastatin.  Patient was previously on Pravastatin. Insurance is charging her a higher co-pay to be on the Rosuvastatin. Patient is okay with change of drug per pharmacy

## 2021-03-04 NOTE — Telephone Encounter (Signed)
I feel very strongly she should remain on rosuvastatin

## 2021-03-05 DIAGNOSIS — R0600 Dyspnea, unspecified: Secondary | ICD-10-CM | POA: Diagnosis not present

## 2021-03-05 DIAGNOSIS — J309 Allergic rhinitis, unspecified: Secondary | ICD-10-CM | POA: Diagnosis not present

## 2021-03-05 DIAGNOSIS — R7989 Other specified abnormal findings of blood chemistry: Secondary | ICD-10-CM | POA: Diagnosis not present

## 2021-03-05 DIAGNOSIS — R609 Edema, unspecified: Secondary | ICD-10-CM | POA: Diagnosis not present

## 2021-03-05 DIAGNOSIS — R251 Tremor, unspecified: Secondary | ICD-10-CM | POA: Diagnosis not present

## 2021-03-05 DIAGNOSIS — E876 Hypokalemia: Secondary | ICD-10-CM | POA: Diagnosis not present

## 2021-03-05 DIAGNOSIS — K219 Gastro-esophageal reflux disease without esophagitis: Secondary | ICD-10-CM | POA: Diagnosis not present

## 2021-03-05 DIAGNOSIS — M545 Low back pain, unspecified: Secondary | ICD-10-CM | POA: Diagnosis not present

## 2021-03-05 DIAGNOSIS — F3341 Major depressive disorder, recurrent, in partial remission: Secondary | ICD-10-CM | POA: Diagnosis not present

## 2021-03-05 DIAGNOSIS — H00031 Abscess of right upper eyelid: Secondary | ICD-10-CM | POA: Diagnosis not present

## 2021-03-05 DIAGNOSIS — K589 Irritable bowel syndrome without diarrhea: Secondary | ICD-10-CM | POA: Diagnosis not present

## 2021-03-05 DIAGNOSIS — E2740 Unspecified adrenocortical insufficiency: Secondary | ICD-10-CM | POA: Diagnosis not present

## 2021-03-06 DIAGNOSIS — M5459 Other low back pain: Secondary | ICD-10-CM | POA: Diagnosis not present

## 2021-03-06 DIAGNOSIS — M62521 Muscle wasting and atrophy, not elsewhere classified, right upper arm: Secondary | ICD-10-CM | POA: Diagnosis not present

## 2021-03-06 DIAGNOSIS — M6281 Muscle weakness (generalized): Secondary | ICD-10-CM | POA: Diagnosis not present

## 2021-03-06 DIAGNOSIS — E2740 Unspecified adrenocortical insufficiency: Secondary | ICD-10-CM | POA: Diagnosis not present

## 2021-03-06 DIAGNOSIS — M62551 Muscle wasting and atrophy, not elsewhere classified, right thigh: Secondary | ICD-10-CM | POA: Diagnosis not present

## 2021-03-06 DIAGNOSIS — M62552 Muscle wasting and atrophy, not elsewhere classified, left thigh: Secondary | ICD-10-CM | POA: Diagnosis not present

## 2021-03-17 ENCOUNTER — Ambulatory Visit (INDEPENDENT_AMBULATORY_CARE_PROVIDER_SITE_OTHER): Payer: Medicare Other | Admitting: Neurology

## 2021-03-17 ENCOUNTER — Encounter (INDEPENDENT_AMBULATORY_CARE_PROVIDER_SITE_OTHER): Payer: Medicare Other | Admitting: Neurology

## 2021-03-17 ENCOUNTER — Other Ambulatory Visit: Payer: Self-pay

## 2021-03-17 DIAGNOSIS — M5416 Radiculopathy, lumbar region: Secondary | ICD-10-CM

## 2021-03-17 DIAGNOSIS — R251 Tremor, unspecified: Secondary | ICD-10-CM

## 2021-03-17 DIAGNOSIS — R269 Unspecified abnormalities of gait and mobility: Secondary | ICD-10-CM

## 2021-03-17 DIAGNOSIS — Z0289 Encounter for other administrative examinations: Secondary | ICD-10-CM

## 2021-03-17 MED ORDER — DULOXETINE HCL 60 MG PO CPEP: 60.0000 mg | ORAL_CAPSULE | Freq: Every day | ORAL | 6 refills | Status: DC

## 2021-03-17 NOTE — Procedures (Signed)
Full Name: Meghan Tucker Gender: Female MRN #: [id number] Date of Birth: 19-Sep-1946    Visit Date: 03/17/2021 12:52 Age: 75 Years Examining Physician: Marcial Pacas, MD  Referring Physician: Marcial Pacas, MD History: 75 year old female with history of chronic low back pain, lumbar decompression surgery, complains of intermittent low back pain, radiating pain to right lower extremity, frequent bilateral calf muscle cramping  Summary of the test:  Nerve conduction study  Bilateral sural superficial peroneal sensory responses were normal.  Bilateral peroneal to EDB motor responses were normal.  Bilateral tibial motor responses showed mild to moderately decreased CMAP amplitude.  Bilateral tibial H reflexes were within normal limit  Electromyography: Selected needle examination of bilateral lower extremity muscles and bilateral lumbosacral paraspinal muscles were performed.  There is evidence of chronic neuropathic changes involving bilateral tibialis anterior, medial gastrocnemius, tibialis anterior, lesser degree of vastus lateralis.  There was also chronic neuropathic changes noted at right biceps femoris short head, long head, right gluteus medius.  There was no evidence of active denervations.  There is increased insertional activity along bilateral lumbar paraspinal muscles.  Conclusion: This is an abnormal study.  There is electrodiagnostic evidence of chronic bilateral lumbosacral radiculopathy, mainly involving bilateral L5-S1, L4 dermatomes.  There is no evidence of active process.  There is no evidence of peripheral neuropathy   ------------------------------- Marcial Pacas, M.D. PhD  [facility] [address] [place], [place] [address] Tel: [phone] Fax: [phone]  Verbal informed consent was obtained from the patient, patient was informed of potential risk of procedure, including bruising, bleeding, hematoma formation, infection, muscle  weakness, muscle pain, numbness, among others.        [place]    Nerve / Sites Muscle Latency Ref. Amplitude Ref. Rel Amp Segments Distance Velocity Ref. Area    ms ms mV mV %  cm m/s m/s mVms  R Peroneal - EDB     Ankle EDB 5.3 ?6.5 5.0 ?2.0 100 Ankle - EDB 9   13.2     Fib head EDB 10.0  4.9  97.2 Fib head - Ankle 24 50 ?44 12.9     Pop fossa EDB 12.1  5.4  110 Pop fossa - Fib head 10 49 ?44 15.8         Pop fossa - Ankle      L Peroneal - EDB     Ankle EDB 5.2 ?6.5 3.1 ?2.0 100 Ankle - EDB 9   9.5     Fib head EDB 10.5  3.1  97.6 Fib head - Ankle 25 48 ?44 10.1     Pop fossa EDB 12.7  3.0  96.4 Pop fossa - Fib head 10 45 ?44 10.3         Pop fossa - Ankle      R Tibial - AH     Ankle AH 5.4 ?5.8 1.6 ?4.0 100 Ankle - AH 9   6.2     Pop fossa AH 12.1  1.3  80.1 Pop fossa - Ankle 36 54 ?41 7.2  L Tibial - AH     Ankle AH 5.0 ?5.8 2.1 ?4.0 100 Ankle - AH 9   8.5     Pop fossa AH 12.9  2.1  100 Pop fossa - Ankle 36 45 ?41 6.5             SNC    Nerve / Sites Rec. Site Peak Lat Ref.  Amp Ref. Segments  Distance    ms ms V V  cm  R Sural - Ankle (Calf)     Calf Ankle 4.3 ?4.4 9 ?6 Calf - Ankle 14  L Sural - Ankle (Calf)     Calf Ankle 3.9 ?4.4 7 ?6 Calf - Ankle 14  R Superficial peroneal - Ankle     Lat leg Ankle 4.2 ?4.4 8 ?6 Lat leg - Ankle 14  L Superficial peroneal - Ankle     Lat leg Ankle 4.1 ?4.4 9 ?6 Lat leg - Ankle 14             F  Wave    Nerve F Lat Ref.   ms ms  R Tibial - AH 55.3 ?56.0  L Tibial - AH 55.3 ?56.0         EMG Summary Table    Spontaneous MUAP Recruitment  Muscle IA Fib PSW Fasc Other Amp Dur. Poly Pattern  R. Tibialis anterior Increased None None None _______ Increased Increased 1+ Reduced  R. Tibialis posterior Normal None None None _______ Increased Increased 1+ Reduced  R. Peroneus longus Normal None None None _______ Increased Increased 1+ Reduced  R. Vastus lateralis Normal None None None _______ Increased Increased 1+ Reduced  R. Biceps  femoris (short head) Normal None None None _______ Increased Increased 1+ Reduced  R. Biceps femoris (long head) Normal None None None _______ Increased Increased 1+ Reduced  R. Gluteus medius Normal None None None _______ Increased Increased 1+ Reduced  R. Lumbar paraspinals (low) Increased 1+ None None _______ Normal Normal Normal Normal  R. Lumbar paraspinals (mid) Increased 1+ None None _______ Normal Normal Normal Normal  L. Tibialis posterior Increased None None None _______ Increased Increased 1+ Reduced  L. Tibialis anterior Increased None None None _______ Increased Increased 1+ Reduced  L. Peroneus longus Increased None None None _______ Increased Increased 1+ Reduced  L. Vastus lateralis Increased None None None _______ Increased Increased 1+ Reduced  L. Lumbar paraspinals (low) Increased None None None _______ Normal Normal Normal Normal  L. Lumbar paraspinals (mid) Increased None None None _______ Normal Normal Normal Normal

## 2021-03-17 NOTE — Progress Notes (Signed)
Meds ordered this encounter  Medications  . DULoxetine (CYMBALTA) 60 MG capsule    Sig: Take 1 capsule (60 mg total) by mouth daily.    Dispense:  30 capsule    Refill:  6

## 2021-03-24 DIAGNOSIS — F988 Other specified behavioral and emotional disorders with onset usually occurring in childhood and adolescence: Secondary | ICD-10-CM | POA: Diagnosis not present

## 2021-03-24 DIAGNOSIS — E2740 Unspecified adrenocortical insufficiency: Secondary | ICD-10-CM | POA: Diagnosis not present

## 2021-03-24 DIAGNOSIS — R251 Tremor, unspecified: Secondary | ICD-10-CM | POA: Diagnosis not present

## 2021-03-24 DIAGNOSIS — M545 Low back pain, unspecified: Secondary | ICD-10-CM | POA: Diagnosis not present

## 2021-03-24 DIAGNOSIS — J028 Acute pharyngitis due to other specified organisms: Secondary | ICD-10-CM | POA: Diagnosis not present

## 2021-03-24 DIAGNOSIS — E876 Hypokalemia: Secondary | ICD-10-CM | POA: Diagnosis not present

## 2021-03-24 DIAGNOSIS — K589 Irritable bowel syndrome without diarrhea: Secondary | ICD-10-CM | POA: Diagnosis not present

## 2021-03-24 DIAGNOSIS — F3341 Major depressive disorder, recurrent, in partial remission: Secondary | ICD-10-CM | POA: Diagnosis not present

## 2021-03-24 DIAGNOSIS — R0600 Dyspnea, unspecified: Secondary | ICD-10-CM | POA: Diagnosis not present

## 2021-03-24 DIAGNOSIS — B9689 Other specified bacterial agents as the cause of diseases classified elsewhere: Secondary | ICD-10-CM | POA: Diagnosis not present

## 2021-03-24 DIAGNOSIS — R609 Edema, unspecified: Secondary | ICD-10-CM | POA: Diagnosis not present

## 2021-03-24 DIAGNOSIS — M818 Other osteoporosis without current pathological fracture: Secondary | ICD-10-CM | POA: Diagnosis not present

## 2021-03-26 DIAGNOSIS — H00014 Hordeolum externum left upper eyelid: Secondary | ICD-10-CM | POA: Diagnosis not present

## 2021-04-06 DIAGNOSIS — R609 Edema, unspecified: Secondary | ICD-10-CM | POA: Diagnosis not present

## 2021-04-06 DIAGNOSIS — R5382 Chronic fatigue, unspecified: Secondary | ICD-10-CM | POA: Diagnosis not present

## 2021-04-06 DIAGNOSIS — F3341 Major depressive disorder, recurrent, in partial remission: Secondary | ICD-10-CM | POA: Diagnosis not present

## 2021-04-06 DIAGNOSIS — E2740 Unspecified adrenocortical insufficiency: Secondary | ICD-10-CM | POA: Diagnosis not present

## 2021-04-06 DIAGNOSIS — M818 Other osteoporosis without current pathological fracture: Secondary | ICD-10-CM | POA: Diagnosis not present

## 2021-04-06 DIAGNOSIS — E876 Hypokalemia: Secondary | ICD-10-CM | POA: Diagnosis not present

## 2021-04-06 DIAGNOSIS — K589 Irritable bowel syndrome without diarrhea: Secondary | ICD-10-CM | POA: Diagnosis not present

## 2021-04-06 DIAGNOSIS — R251 Tremor, unspecified: Secondary | ICD-10-CM | POA: Diagnosis not present

## 2021-04-06 DIAGNOSIS — R0600 Dyspnea, unspecified: Secondary | ICD-10-CM | POA: Diagnosis not present

## 2021-04-06 DIAGNOSIS — M545 Low back pain, unspecified: Secondary | ICD-10-CM | POA: Diagnosis not present

## 2021-04-06 DIAGNOSIS — F988 Other specified behavioral and emotional disorders with onset usually occurring in childhood and adolescence: Secondary | ICD-10-CM | POA: Diagnosis not present

## 2021-04-06 DIAGNOSIS — R002 Palpitations: Secondary | ICD-10-CM | POA: Diagnosis not present

## 2021-04-16 DIAGNOSIS — L820 Inflamed seborrheic keratosis: Secondary | ICD-10-CM | POA: Diagnosis not present

## 2021-04-17 ENCOUNTER — Encounter: Payer: Self-pay | Admitting: Internal Medicine

## 2021-04-17 ENCOUNTER — Other Ambulatory Visit: Payer: Self-pay

## 2021-04-17 ENCOUNTER — Ambulatory Visit (INDEPENDENT_AMBULATORY_CARE_PROVIDER_SITE_OTHER): Payer: Medicare Other | Admitting: Internal Medicine

## 2021-04-17 VITALS — BP 148/80 | HR 76 | Ht 65.0 in | Wt 133.0 lb

## 2021-04-17 DIAGNOSIS — E0590 Thyrotoxicosis, unspecified without thyrotoxic crisis or storm: Secondary | ICD-10-CM | POA: Diagnosis not present

## 2021-04-17 DIAGNOSIS — E2749 Other adrenocortical insufficiency: Secondary | ICD-10-CM | POA: Diagnosis not present

## 2021-04-17 HISTORY — DX: Thyrotoxicosis, unspecified without thyrotoxic crisis or storm: E05.90

## 2021-04-17 LAB — COMPREHENSIVE METABOLIC PANEL
ALT: 30 U/L (ref 0–35)
AST: 25 U/L (ref 0–37)
Albumin: 3.8 g/dL (ref 3.5–5.2)
Alkaline Phosphatase: 57 U/L (ref 39–117)
BUN: 23 mg/dL (ref 6–23)
CO2: 28 mEq/L (ref 19–32)
Calcium: 9.5 mg/dL (ref 8.4–10.5)
Chloride: 101 mEq/L (ref 96–112)
Creatinine, Ser: 0.96 mg/dL (ref 0.40–1.20)
GFR: 58.32 mL/min — ABNORMAL LOW (ref >60.00)
Glucose, Bld: 106 mg/dL — ABNORMAL HIGH (ref 70–99)
Potassium: 4.4 mEq/L (ref 3.5–5.1)
Sodium: 137 mEq/L (ref 135–145)
Total Bilirubin: 0.7 mg/dL (ref 0.2–1.2)
Total Protein: 6.4 g/dL (ref 6.0–8.3)

## 2021-04-17 LAB — TSH: TSH: 0.75 u[IU]/mL (ref 0.35–4.50)

## 2021-04-17 LAB — T4, FREE: Free T4: 0.94 ng/dL (ref 0.60–1.60)

## 2021-04-17 NOTE — Patient Instructions (Addendum)
-   Continue hydrocortisone 15 mg in the morning and 5 mg in the afternoon  - Continue Methimazole 5 mg, 1 tablet daily      ADRENAL INSUFFICIENCY SICK DAY RULES:  Should you face an extreme emotional or physical stress such as trauma, surgery or acute illness, this will require extra steroid coverage so that the body can meet that stress.   Without increasing the steroid dose you may experience severe weakness, headache, dizziness, nausea and vomiting and possibly a more serious deterioration in health.  Typically the dose of steroids will only need to be increased for a couple of days if you have an illness that is transient and managed in the community.   If you are unable to take/absorb an increased dose of steroids orally because of vomiting or diarrhea, you will urgently require steroid injections and should present to an Emergency Department.  The general advice for any serious illness is as follows: 1. Double the normal daily steroid dose for up to 3 days if you have a temperature of more than 37.50C (99.50F) with signs of sickness, or severe emotional or physical distress 2. Contact your primary care doctor and Endocrinologist if the illness worsens or it lasts for more than 3 days.  3. In cases of severe illness, urgent medical assistance should be promptly sought. 4. If you experience vomiting/diarrhea or are unable to take steroids by mouth,  Please seek urgent medical help.     HC 10 mg,

## 2021-04-17 NOTE — Progress Notes (Signed)
Name: Meghan Tucker  MRN/ DOB: [id number], 1946-05-03    Age/ Sex: 75 y.o., female     PCP: Cher Nakai, MD   Reason for Endocrinology Evaluation: Adrenal insufficiency     Initial Endocrinology Clinic Visit: 01/16/2021    PATIENT IDENTIFIER: [name] is a 75 y.o., female with a past medical history of OA and insomnia  . She has followed with [facility] since 01/16/2021 for consultative assistance with management of her adrenal insufficiency  HISTORICAL SUMMARY:   Meghan Tucker presented to her PCP in 10/2020 with 6 month history of fatigue , weight loss and myalgias.  In September 2021 she was having back issues and was unable to get up due to lack of energy from September 2021 until 10/2020 . During this  Time she was evaluated by cardiology- work up normal .   Sees neurosurgery  ( Dr. Ellene Route) for back pains for years.   She does endorse back injections x2 in 08/2020   She was given a kenalog injection in 11/11/2020 with dramatic clinical improvement per PCP  Cortisol level low 0.6 on 11/18 /2021 ACTH undetectable on 11/ 23/2021   No narcotic intake  No marijuana use    She was started on Hydrocortisone 10/2020    THYROID HISTORY: She was noted to have suppressed TSH 0.01 uIU/Ml and elevated FT4 on labs 12/2020 but she was on Biotin, repeat labs off biotin confirmed diagnosis of hyperthyroidism and was started on Methimazole . TSI and TRAb negative      Mother with thyroid disease and brother  SUBJECTIVE:    Today (04/17/2021):  Meghan Tucker is here for hyperthyroidism and adrenal insufficiency  She has been on two tablet of hydrocortisone in the morning per pt based on PCP 's recommendations for fatigue  She continues with fatigue   She has been noted with weight gain     NO recent steroid injections  Denies diarrhea  She continues with termor of the hand   Has a medical alert bracelet    HOME ENDOCRINE MEDS: Methimazole 5 mg  daily  HC 10 mg , 1 tablet QAM - has been taking TWO  HC 10 mg,  half a tablet Q afternoon    HISTORY:  Past Medical History:  Past Medical History:  Diagnosis Date  . Adrenal insufficiency ([place])   . Arthritis    "all over"  . Chronic back pain    2 buldging disc,stenosis  . Diverticulosis   . Essential hypertension    takes Metoprolol daily  . GERD (gastroesophageal reflux disease)    takes Omeprazole daily  . History of hiatal hernia   . History of kidney stones   . History of kidney stones   . History of migraine    none since menopause yrs ago  . Hyperlipidemia    takes Fish Oil daily  . Hypertension    takes Metoprolol daily  . Insomnia    takes Ambien nightly  . Sciatica   . Sciatica   . Thyroid disease   . Tingling    right arm.Both feet   . Tremor     Past Surgical History:  Past Surgical History:  Procedure Laterality Date  . CARPAL TUNNEL RELEASE Left   . cataract surgery Bilateral   . COLONOSCOPY     Dr. Jerilynn Mages in [employer]  . ESOPHAGOGASTRODUODENOSCOPY     Dr Jerilynn Mages in [place]  . ESOPHAGOGASTRODUODENOSCOPY (EGD) WITH ESOPHAGEAL DILATION  05/14/2015   with Propofol  .  ESOPHAGOGASTRODUODENOSCOPY (EGD) WITH PROPOFOL  10/19/2013  . GANGLION CYST EXCISION    . HAMMERTOE RECONSTRUCTION WITH WEIL OSTEOTOMY Left 07/17/2015   Procedure: LEFT 2-3 WEIL OSTEOTOMY AND HAMMERTOE CORRECTION ;  Surgeon: Toni Arthurs, MD;  Location: [facility];  Service: Orthopedics;  Laterality: Left;     Social History:  reports that she has never smoked. She has never used smokeless tobacco. She reports current alcohol use. She reports that she does not use drugs. Family History:  Family History  Problem Relation Age of Onset  . Heart disease Father   . Heart attack Father 67  . Stroke Mother 75  . Colon cancer Neg Hx   . Esophageal cancer Neg Hx   . Cancer Neg Hx       HOME MEDICATIONS: Allergies as of 04/17/2021      Reactions   Atorvastatin Other (See  Comments)   MYALGIAS MYALGIAS Other reaction(s): Myalgias (intolerance), Other, Other (See Comments) MYALGIAS MYALGIAS MYALGIAS   Rosuvastatin Other (See Comments)   MYALGIAS MYALGIAS Other reaction(s): Myalgias (intolerance), Other, Other (See Comments) MYALGIAS MYALGIAS MYALGIAS      Medication List       Accurate as of April 17, 2021  1:07 PM. If you have any questions, ask your nurse or doctor.        acebutolol 200 MG capsule Commonly known as: SECTRAL Take 1 capsule (200 mg total) by mouth 2 (two) times daily as needed.   ALIGN PO Take by mouth.   CALCIUM CITRATE PO Take 1 tablet by mouth every other day.   celecoxib 200 MG capsule Commonly known as: CELEBREX Take 200 mg by mouth 2 (two) times daily.   cetirizine 10 MG tablet Commonly known as: ZYRTEC Take 10 mg by mouth at bedtime.   citalopram 10 MG tablet Commonly known as: CELEXA TAKE 1 TABLET ONCE DAILY   dicyclomine 10 MG capsule Commonly known as: BENTYL Take 1 capsule (10 mg total) by mouth 2 (two) times daily.   DULoxetine 20 MG capsule Commonly known as: Cymbalta Take 1 capsule (20 mg total) by mouth daily.   DULoxetine 60 MG capsule Commonly known as: Cymbalta Take 1 capsule (60 mg total) by mouth daily.   FIBER PO Take 1 tablet by mouth daily.   folic acid 1 MG tablet Commonly known as: FOLVITE Take 1 mg by mouth daily.   hydrocortisone 10 MG tablet Commonly known as: CORTEF Take 10 mg by mouth daily.   hydrOXYzine 10 MG tablet Commonly known as: ATARAX/VISTARIL Take 10 mg by mouth 2 (two) times daily.   METAMUCIL FIBER PO Take 2 tablets by mouth daily.   METAMUCIL PO Take by mouth every morning. 3 in 1 fiber   methimazole 5 MG tablet Commonly known as: TAPAZOLE Take 1 tablet (5 mg total) by mouth daily.   pantoprazole 40 MG tablet Commonly known as: Protonix Take 1 tablet (40 mg total) by mouth daily.   POTASSIUM CHLORIDE ER PO Take 10 mEq by mouth daily.    PREVAGEN PO Take 1 tablet by mouth daily. Prevagen   rosuvastatin 10 MG tablet Commonly known as: CRESTOR Take 1 tablet (10 mg total) by mouth daily.   Vitamin D (Ergocalciferol) 1.25 MG (50000 UNIT) Caps capsule Commonly known as: DRISDOL TAKE 1 CAPSULE ONCE A WEEK.   zolpidem 12.5 MG CR tablet Commonly known as: AMBIEN CR Take 12.5 mg by mouth at bedtime.         OBJECTIVE:  PHYSICAL EXAM: VS: BP (!) 148/80   Pulse 76   Ht 5\' 5"  (1.651 m)   Wt 133 lb (60.3 kg)   SpO2 98%   BMI 22.13 kg/m      Body surface area is 1.66 meters squared.  EXAM: General: Pt appears well and is in NAD  Eyes: External eye exam normal without stare, lid lag or exophthalmos.  EOM intact.    Neck: General: Supple without adenopathy. Thyroid: Right thyroid enlargement noted   Lungs: Clear with good BS bilat with no rales, rhonchi, or wheezes  Heart: Auscultation: RRR.  Abdomen: Normoactive bowel sounds, soft, nontender, without masses or organomegaly palpable  Extremities:  BL LE: No pretibial edema normal ROM and strength.  Neuro: Cranial nerves: II - XII grossly intact  Cerebellar: Normal coordination and movement; no tremor Motor: Normal strength throughout DTRs: 2+ and symmetric in UE without delay in relaxation phase  Mental Status: Judgment, insight: Intact Mood and affect: No depression, anxiety, or agitation     DATA REVIEWED:   Results for LYNIAH, FUJITA (MRN Gaylyn Cheers) as of 04/20/2021 11:42  Ref. Range 04/17/2021 13:43 04/17/2021 13:43  Sodium Latest Ref Range: 135 - 145 mEq/L 137   Potassium Latest Ref Range: 3.5 - 5.1 mEq/L 4.4   Chloride Latest Ref Range: 96 - 112 mEq/L 101   CO2 Latest Ref Range: 19 - 32 mEq/L 28   Glucose Latest Ref Range: 70 - 99 mg/dL 04/19/2021 (H)   BUN Latest Ref Range: 6 - 23 mg/dL 23   Creatinine Latest Ref Range: 0.40 - 1.20 mg/dL 381   Calcium Latest Ref Range: 8.4 - 10.5 mg/dL 9.5   Alkaline Phosphatase Latest Ref Range: 39 - 117 U/L 57    Albumin Latest Ref Range: 3.5 - 5.2 g/dL 3.8   AST Latest Ref Range: 0 - 37 U/L 25   ALT Latest Ref Range: 0 - 35 U/L 30   Total Protein Latest Ref Range: 6.0 - 8.3 g/dL 6.4   Total Bilirubin Latest Ref Range: 0.2 - 1.2 mg/dL 0.7   GFR Latest Ref Range: >60.00 mL/min 58.32 (L)   ACTH Latest Ref Range: 7.2 - 63.3 pg/mL  <1.5 (L)  TSH Latest Ref Range: 0.450 - 4.500 uIU/mL 0.75 0.790  T4,Free(Direct) Latest Ref Range: 0.82 - 1.77 ng/dL 0.17 5.10    ASSESSMENT / PLAN / RECOMMENDATIONS:   1. Secondary adrenal Insufficiency    - She has a medical alert bracelet  - She has doubled up on the hydrocortisone morning dose due to "fatigue ". We discussed that fatigue is non-specific symptoms and taking extra and necessary hydrocortisone would put her at risk of iatrogenic cushing syndrome . - Will reduce the dose as below  - Discussed sick day rule    Medications  HC 10 mg, 1.5 tabs QAM and HALF a tablet Q afternoon      2. Hyperthyroidism :  - Discussed D/D of graves' disease vs toxic nodule  - TRAb negative  - Will order a thyroid ultrasound through [facility] due to right thyroid fullness on exam  - Tolerating methimazole    Medication Continue Methimazole 5 mg daily    F/U in 3 months   Signed electronically by: [facility], MD  [facility] Endocrin[facility]  [facility] [address] [place] [place], [place] Phone: [phone] FAX: [phone]      CC: [phone], MD [address] [place] Phone: [phone]  Fax: [phone]   Return to Endocrinology clinic as below: Future Appointments  Date Time Provider [place]  04/17/2021  1:20 PM Tanielle Emigh, Melanie Crazier, MD [facility] None

## 2021-04-18 LAB — THYROID STIMULATING IMMUNOGLOBULIN: Thyroid Stim Immunoglobulin: <0.1 IU/L (ref 0.00–0.55)

## 2021-04-18 LAB — T4, FREE: Free T4: 1.32 ng/dL (ref 0.82–1.77)

## 2021-04-18 LAB — ACTH: ACTH: <1.5 pg/mL — ABNORMAL LOW (ref 7.2–63.3)

## 2021-04-18 LAB — TSH: TSH: 0.79 u[IU]/mL (ref 0.450–4.500)

## 2021-04-19 LAB — TRAB (TSH RECEPTOR BINDING ANTIBODY): TRAB: <1 IU/L (ref <=2.00)

## 2021-04-20 ENCOUNTER — Encounter: Payer: Self-pay | Admitting: Internal Medicine

## 2021-04-20 MED ORDER — METHIMAZOLE 5 MG PO TABS: 5.0000 mg | ORAL_TABLET | Freq: Every day | ORAL | 1 refills | Status: DC

## 2021-04-23 DIAGNOSIS — E0590 Thyrotoxicosis, unspecified without thyrotoxic crisis or storm: Secondary | ICD-10-CM | POA: Diagnosis not present

## 2021-04-23 DIAGNOSIS — E042 Nontoxic multinodular goiter: Secondary | ICD-10-CM | POA: Diagnosis not present

## 2021-04-27 DIAGNOSIS — K219 Gastro-esophageal reflux disease without esophagitis: Secondary | ICD-10-CM | POA: Diagnosis not present

## 2021-04-27 DIAGNOSIS — M818 Other osteoporosis without current pathological fracture: Secondary | ICD-10-CM | POA: Diagnosis not present

## 2021-04-27 DIAGNOSIS — E7800 Pure hypercholesterolemia, unspecified: Secondary | ICD-10-CM | POA: Diagnosis not present

## 2021-04-27 DIAGNOSIS — R251 Tremor, unspecified: Secondary | ICD-10-CM | POA: Diagnosis not present

## 2021-04-27 DIAGNOSIS — K589 Irritable bowel syndrome without diarrhea: Secondary | ICD-10-CM | POA: Diagnosis not present

## 2021-04-27 DIAGNOSIS — R030 Elevated blood-pressure reading, without diagnosis of hypertension: Secondary | ICD-10-CM | POA: Diagnosis not present

## 2021-04-27 DIAGNOSIS — R0600 Dyspnea, unspecified: Secondary | ICD-10-CM | POA: Diagnosis not present

## 2021-04-27 DIAGNOSIS — E876 Hypokalemia: Secondary | ICD-10-CM | POA: Diagnosis not present

## 2021-04-27 DIAGNOSIS — E2740 Unspecified adrenocortical insufficiency: Secondary | ICD-10-CM | POA: Diagnosis not present

## 2021-04-27 DIAGNOSIS — F3341 Major depressive disorder, recurrent, in partial remission: Secondary | ICD-10-CM | POA: Diagnosis not present

## 2021-04-27 DIAGNOSIS — R609 Edema, unspecified: Secondary | ICD-10-CM | POA: Diagnosis not present

## 2021-04-27 DIAGNOSIS — M545 Low back pain, unspecified: Secondary | ICD-10-CM | POA: Diagnosis not present

## 2021-04-27 DIAGNOSIS — F988 Other specified behavioral and emotional disorders with onset usually occurring in childhood and adolescence: Secondary | ICD-10-CM | POA: Diagnosis not present

## 2021-04-29 ENCOUNTER — Telehealth: Payer: Self-pay | Admitting: Internal Medicine

## 2021-04-29 NOTE — Telephone Encounter (Signed)
Results have just been received and indexed through [facility] and routed to Dr [facility]. FYI

## 2021-04-29 NOTE — Telephone Encounter (Signed)
Please let her know that her thyroid ultrasound shows 3 nodules, these nodules are benign and have been stable since 2016 , so no intervention at this time    Thanks

## 2021-04-29 NOTE — Telephone Encounter (Signed)
This is under CC's Chart

## 2021-04-29 NOTE — Telephone Encounter (Signed)
Message left for patient to return my call.  

## 2021-04-29 NOTE — Telephone Encounter (Signed)
Pt requests a call once we get her results regarding her Thyroid imaging appt on 4/22

## 2021-05-01 NOTE — Telephone Encounter (Signed)
Message left for patient to return my call.  

## 2021-05-04 NOTE — Telephone Encounter (Signed)
Spoken to patient and notified Dr Shamleffer's comments. Verbalized understanding.   

## 2021-05-04 NOTE — Telephone Encounter (Signed)
Message left for patient to return my call.  

## 2021-05-04 NOTE — Telephone Encounter (Signed)
Pt called while everyone was at lunch for her results  Callback # [phone]

## 2021-05-04 NOTE — Telephone Encounter (Signed)
Also patient wanted to mention to Dr Kelton Pillar that she still tired all the time and been Methimazole as prescribed.

## 2021-05-06 DIAGNOSIS — R0600 Dyspnea, unspecified: Secondary | ICD-10-CM | POA: Diagnosis not present

## 2021-05-06 DIAGNOSIS — R609 Edema, unspecified: Secondary | ICD-10-CM | POA: Diagnosis not present

## 2021-05-06 DIAGNOSIS — F3341 Major depressive disorder, recurrent, in partial remission: Secondary | ICD-10-CM | POA: Diagnosis not present

## 2021-05-06 DIAGNOSIS — E2740 Unspecified adrenocortical insufficiency: Secondary | ICD-10-CM | POA: Diagnosis not present

## 2021-05-06 DIAGNOSIS — M545 Low back pain, unspecified: Secondary | ICD-10-CM | POA: Diagnosis not present

## 2021-05-06 DIAGNOSIS — F988 Other specified behavioral and emotional disorders with onset usually occurring in childhood and adolescence: Secondary | ICD-10-CM | POA: Diagnosis not present

## 2021-05-06 DIAGNOSIS — E7800 Pure hypercholesterolemia, unspecified: Secondary | ICD-10-CM | POA: Diagnosis not present

## 2021-05-06 DIAGNOSIS — R030 Elevated blood-pressure reading, without diagnosis of hypertension: Secondary | ICD-10-CM | POA: Diagnosis not present

## 2021-05-06 DIAGNOSIS — E876 Hypokalemia: Secondary | ICD-10-CM | POA: Diagnosis not present

## 2021-05-06 DIAGNOSIS — R251 Tremor, unspecified: Secondary | ICD-10-CM | POA: Diagnosis not present

## 2021-05-06 DIAGNOSIS — M25552 Pain in left hip: Secondary | ICD-10-CM | POA: Diagnosis not present

## 2021-05-06 DIAGNOSIS — M818 Other osteoporosis without current pathological fracture: Secondary | ICD-10-CM | POA: Diagnosis not present

## 2021-05-27 ENCOUNTER — Other Ambulatory Visit: Payer: Self-pay | Admitting: Cardiology

## 2021-05-27 NOTE — Telephone Encounter (Signed)
Refill sent to pharmacy.   

## 2021-05-28 IMAGING — CT CT CARDIAC CORONARY ARTERY CALCIUM SCORE
3 series · 14 of 20 positions shown, 15 images · non-contrast
Comparison: 10/30/2020 chest radiograph from [facility]. 09/16/2016 CT
from [facility].

Addendum:
CLINICAL DATA: Risk stratification

EXAM:
Coronary Calcium Score
MEDICATIONS:
None
TECHNIQUE: The patient was scanned on a Siemens Force scanner. Axial
non-contrast 3 mm slices were carried out through the heart. The
data set was analyzed on a dedicated work station and scored using
the Agatson method.

[Series 2: casc 3.0 bv41 2 bestdiast 66 % · axial · 0.39mm/px · z∈[-242,-170]mm · 4 of 41 slices shown, 5 images]
[im 9/41  vessel]
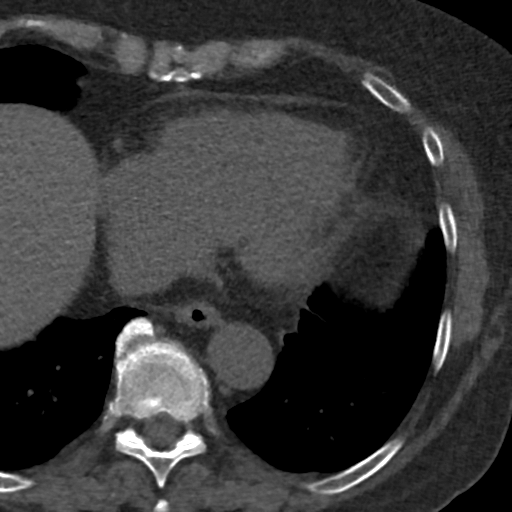
[im 9/41  lung]
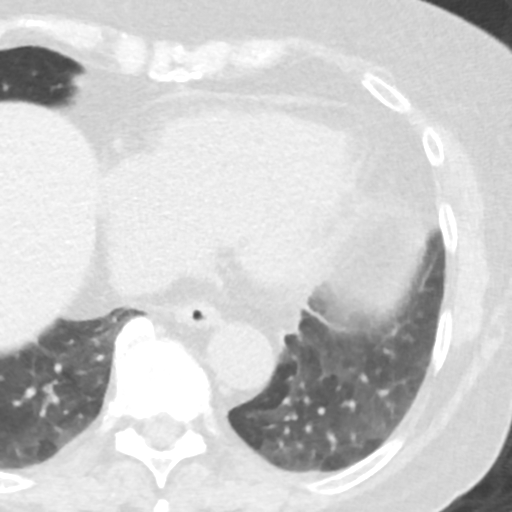
[im 17/41  vessel]
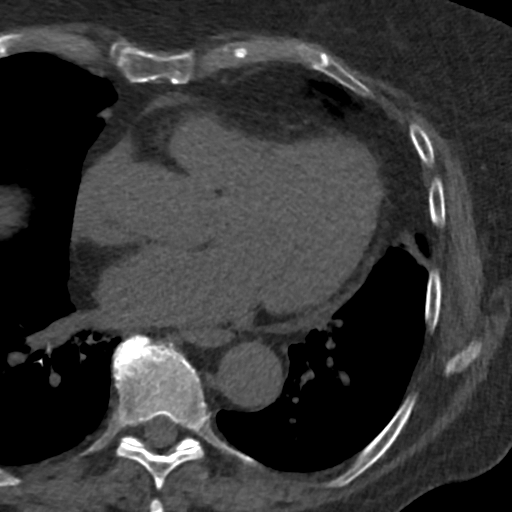
[im 25/41  vessel]
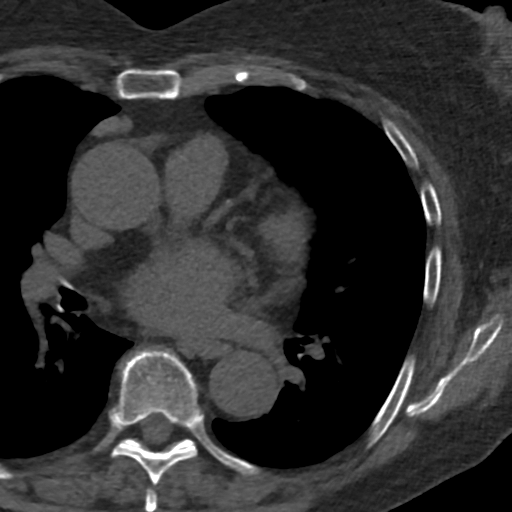
[im 33/41  vessel]
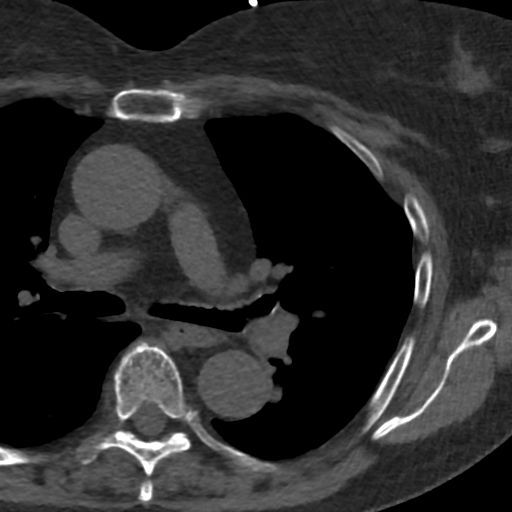

[Series 3: lung 66 % · axial · 0.66mm/px · z∈[-248,-167]mm · 5 of 41 slices shown]
[im 7/41  lung]
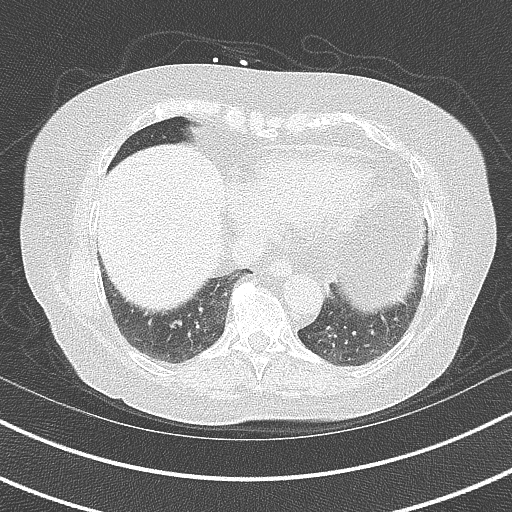
[im 14/41  lung]
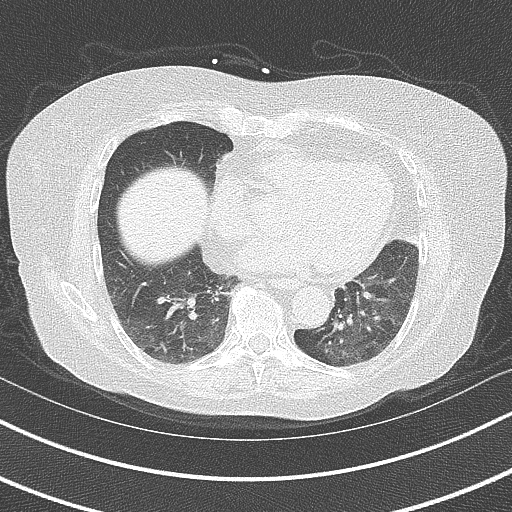
[im 21/41  lung]
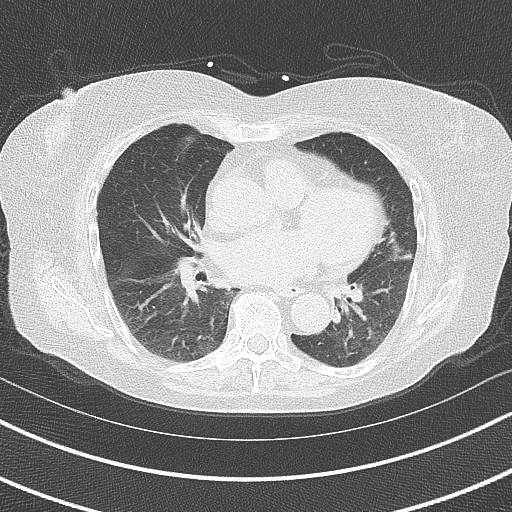
[im 27/41  lung]
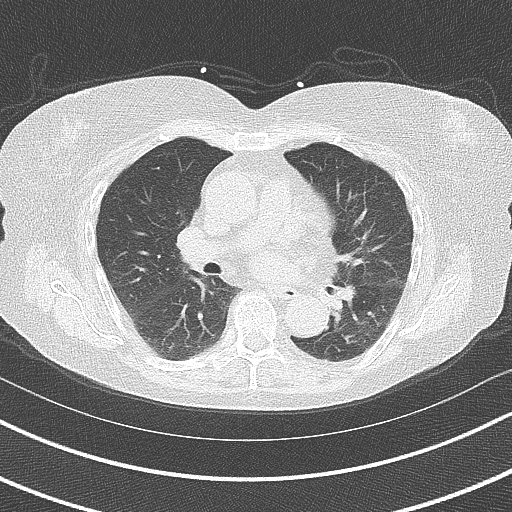
[im 34/41  lung]
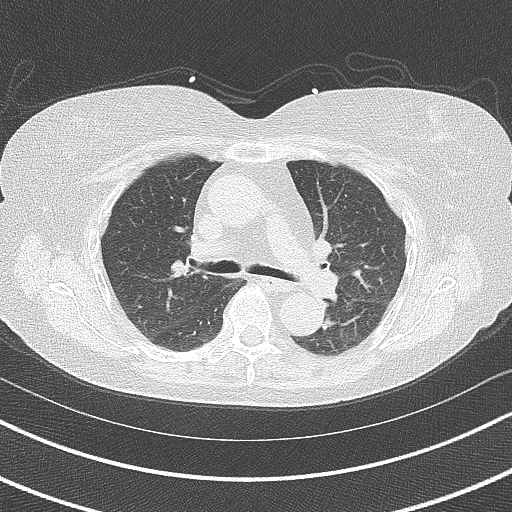

[Series 4: lung st 66 % · axial · 0.66mm/px · z∈[-248,-167]mm · 5 of 41 slices shown]
[im 7/41  lung]
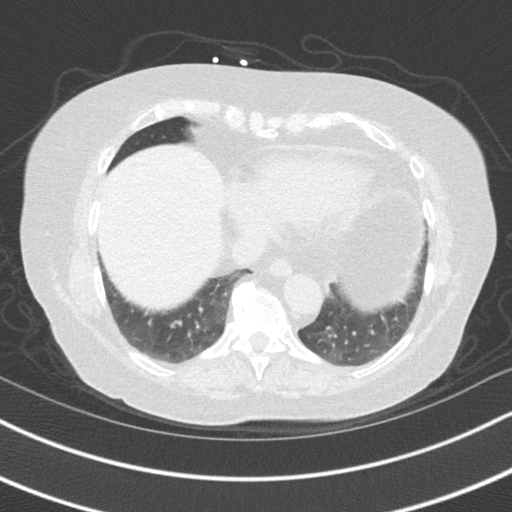
[im 14/41  lung]
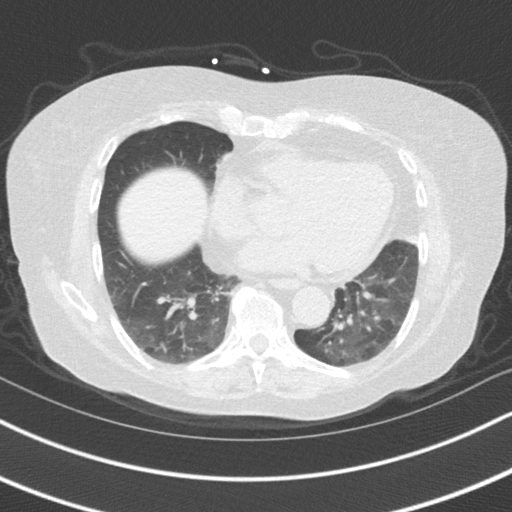
[im 21/41  lung]
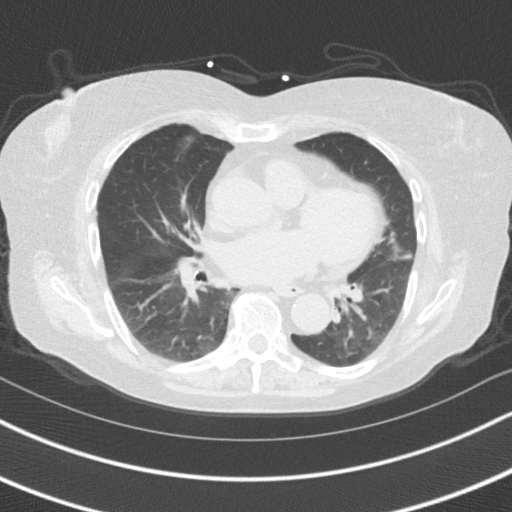
[im 27/41  lung]
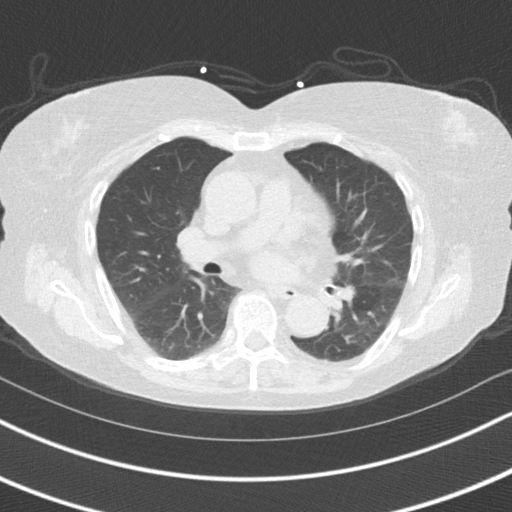
[im 34/41  lung]
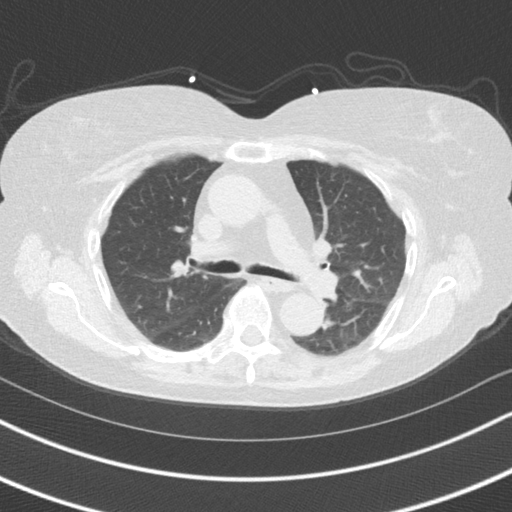

[14 of 20 positions shown; findings below may reference images not displayed]

FINDINGS: Non-cardiac: See separate report from [REDACTED].

Ascending Aorta: Normal size, no calcifications.

Pericardium: Normal.

Coronary arteries: Normal origin.
IMPRESSION: Coronary calcium score of 0.6. This was 29 percentile for age and
sex matched control.

ADDENDUM:
OVER-READ INTERPRETATION  CT CHEST

The following report is an over-read performed by radiologist Dr.
does not include interpretation of cardiac or coronary anatomy or
pathology. The calcium score interpretation by the cardiologist is
attached.
FINDINGS: No pleural fluid. 4 mm right lower lobe pulmonary nodule on [DATE] is
similar on 09/16/2016 and considered benign.

Tortuous thoracic aorta.

No imaged middle mediastinal or hilar adenopathy. An anterior
mediastinal 5 mm node is upper normal and similar in 4087, reactive.

Normal imaged portions of the liver, spleen, stomach.

No acute osseous abnormality.
IMPRESSION: No acute findings in the imaged extracardiac chest.

*** End of Addendum ***
FINDINGS: Non-cardiac: See separate report from [REDACTED].

Ascending Aorta: Normal size, no calcifications.

Pericardium: Normal.

Coronary arteries: Normal origin.
IMPRESSION: Coronary calcium score of 0.6. This was 29 percentile for age and
sex matched control.

## 2021-06-01 DIAGNOSIS — R030 Elevated blood-pressure reading, without diagnosis of hypertension: Secondary | ICD-10-CM | POA: Diagnosis not present

## 2021-06-01 DIAGNOSIS — E876 Hypokalemia: Secondary | ICD-10-CM | POA: Diagnosis not present

## 2021-06-01 DIAGNOSIS — K589 Irritable bowel syndrome without diarrhea: Secondary | ICD-10-CM | POA: Diagnosis not present

## 2021-06-01 DIAGNOSIS — R609 Edema, unspecified: Secondary | ICD-10-CM | POA: Diagnosis not present

## 2021-06-01 DIAGNOSIS — E7800 Pure hypercholesterolemia, unspecified: Secondary | ICD-10-CM | POA: Diagnosis not present

## 2021-06-01 DIAGNOSIS — E2740 Unspecified adrenocortical insufficiency: Secondary | ICD-10-CM | POA: Diagnosis not present

## 2021-06-01 DIAGNOSIS — F3341 Major depressive disorder, recurrent, in partial remission: Secondary | ICD-10-CM | POA: Diagnosis not present

## 2021-06-01 DIAGNOSIS — M818 Other osteoporosis without current pathological fracture: Secondary | ICD-10-CM | POA: Diagnosis not present

## 2021-06-01 DIAGNOSIS — M545 Low back pain, unspecified: Secondary | ICD-10-CM | POA: Diagnosis not present

## 2021-06-01 DIAGNOSIS — F988 Other specified behavioral and emotional disorders with onset usually occurring in childhood and adolescence: Secondary | ICD-10-CM | POA: Diagnosis not present

## 2021-06-01 DIAGNOSIS — R0600 Dyspnea, unspecified: Secondary | ICD-10-CM | POA: Diagnosis not present

## 2021-06-01 DIAGNOSIS — R251 Tremor, unspecified: Secondary | ICD-10-CM | POA: Diagnosis not present

## 2021-06-08 DIAGNOSIS — R6884 Jaw pain: Secondary | ICD-10-CM | POA: Diagnosis not present

## 2021-06-11 DIAGNOSIS — R6884 Jaw pain: Secondary | ICD-10-CM | POA: Diagnosis not present

## 2021-06-15 DIAGNOSIS — R6884 Jaw pain: Secondary | ICD-10-CM | POA: Diagnosis not present

## 2021-06-22 DIAGNOSIS — R6884 Jaw pain: Secondary | ICD-10-CM | POA: Diagnosis not present

## 2021-06-25 DIAGNOSIS — R6884 Jaw pain: Secondary | ICD-10-CM | POA: Diagnosis not present

## 2021-07-06 DIAGNOSIS — K589 Irritable bowel syndrome without diarrhea: Secondary | ICD-10-CM | POA: Diagnosis not present

## 2021-07-06 DIAGNOSIS — E876 Hypokalemia: Secondary | ICD-10-CM | POA: Diagnosis not present

## 2021-07-06 DIAGNOSIS — M159 Polyosteoarthritis, unspecified: Secondary | ICD-10-CM | POA: Diagnosis not present

## 2021-07-06 DIAGNOSIS — K219 Gastro-esophageal reflux disease without esophagitis: Secondary | ICD-10-CM | POA: Diagnosis not present

## 2021-07-06 DIAGNOSIS — R609 Edema, unspecified: Secondary | ICD-10-CM | POA: Diagnosis not present

## 2021-07-06 DIAGNOSIS — R0609 Other forms of dyspnea: Secondary | ICD-10-CM | POA: Diagnosis not present

## 2021-07-06 DIAGNOSIS — M818 Other osteoporosis without current pathological fracture: Secondary | ICD-10-CM | POA: Diagnosis not present

## 2021-07-06 DIAGNOSIS — F3341 Major depressive disorder, recurrent, in partial remission: Secondary | ICD-10-CM | POA: Diagnosis not present

## 2021-07-06 DIAGNOSIS — M545 Low back pain, unspecified: Secondary | ICD-10-CM | POA: Diagnosis not present

## 2021-07-06 DIAGNOSIS — R030 Elevated blood-pressure reading, without diagnosis of hypertension: Secondary | ICD-10-CM | POA: Diagnosis not present

## 2021-07-06 DIAGNOSIS — R251 Tremor, unspecified: Secondary | ICD-10-CM | POA: Diagnosis not present

## 2021-07-06 DIAGNOSIS — F988 Other specified behavioral and emotional disorders with onset usually occurring in childhood and adolescence: Secondary | ICD-10-CM | POA: Diagnosis not present

## 2021-07-13 DIAGNOSIS — R609 Edema, unspecified: Secondary | ICD-10-CM | POA: Diagnosis not present

## 2021-07-13 DIAGNOSIS — M545 Low back pain, unspecified: Secondary | ICD-10-CM | POA: Diagnosis not present

## 2021-07-13 DIAGNOSIS — R030 Elevated blood-pressure reading, without diagnosis of hypertension: Secondary | ICD-10-CM | POA: Diagnosis not present

## 2021-07-13 DIAGNOSIS — M818 Other osteoporosis without current pathological fracture: Secondary | ICD-10-CM | POA: Diagnosis not present

## 2021-07-13 DIAGNOSIS — K219 Gastro-esophageal reflux disease without esophagitis: Secondary | ICD-10-CM | POA: Diagnosis not present

## 2021-07-13 DIAGNOSIS — F988 Other specified behavioral and emotional disorders with onset usually occurring in childhood and adolescence: Secondary | ICD-10-CM | POA: Diagnosis not present

## 2021-07-13 DIAGNOSIS — R251 Tremor, unspecified: Secondary | ICD-10-CM | POA: Diagnosis not present

## 2021-07-13 DIAGNOSIS — E876 Hypokalemia: Secondary | ICD-10-CM | POA: Diagnosis not present

## 2021-07-13 DIAGNOSIS — F3341 Major depressive disorder, recurrent, in partial remission: Secondary | ICD-10-CM | POA: Diagnosis not present

## 2021-07-13 DIAGNOSIS — K589 Irritable bowel syndrome without diarrhea: Secondary | ICD-10-CM | POA: Diagnosis not present

## 2021-07-13 DIAGNOSIS — R0609 Other forms of dyspnea: Secondary | ICD-10-CM | POA: Diagnosis not present

## 2021-07-13 DIAGNOSIS — M159 Polyosteoarthritis, unspecified: Secondary | ICD-10-CM | POA: Diagnosis not present

## 2021-07-22 DIAGNOSIS — Z9181 History of falling: Secondary | ICD-10-CM | POA: Diagnosis not present

## 2021-07-22 DIAGNOSIS — Z0000 Encounter for general adult medical examination without abnormal findings: Secondary | ICD-10-CM | POA: Diagnosis not present

## 2021-07-22 DIAGNOSIS — Z1331 Encounter for screening for depression: Secondary | ICD-10-CM | POA: Diagnosis not present

## 2021-07-22 DIAGNOSIS — E785 Hyperlipidemia, unspecified: Secondary | ICD-10-CM | POA: Diagnosis not present

## 2021-07-24 ENCOUNTER — Ambulatory Visit: Payer: Medicare Other | Admitting: Internal Medicine

## 2021-07-25 DIAGNOSIS — M818 Other osteoporosis without current pathological fracture: Secondary | ICD-10-CM | POA: Diagnosis not present

## 2021-07-25 DIAGNOSIS — F3341 Major depressive disorder, recurrent, in partial remission: Secondary | ICD-10-CM | POA: Diagnosis not present

## 2021-07-25 DIAGNOSIS — K589 Irritable bowel syndrome without diarrhea: Secondary | ICD-10-CM | POA: Diagnosis not present

## 2021-07-25 DIAGNOSIS — R251 Tremor, unspecified: Secondary | ICD-10-CM | POA: Diagnosis not present

## 2021-07-25 DIAGNOSIS — M25552 Pain in left hip: Secondary | ICD-10-CM | POA: Diagnosis not present

## 2021-07-25 DIAGNOSIS — M159 Polyosteoarthritis, unspecified: Secondary | ICD-10-CM | POA: Diagnosis not present

## 2021-07-25 DIAGNOSIS — E876 Hypokalemia: Secondary | ICD-10-CM | POA: Diagnosis not present

## 2021-07-25 DIAGNOSIS — F988 Other specified behavioral and emotional disorders with onset usually occurring in childhood and adolescence: Secondary | ICD-10-CM | POA: Diagnosis not present

## 2021-07-25 DIAGNOSIS — R0609 Other forms of dyspnea: Secondary | ICD-10-CM | POA: Diagnosis not present

## 2021-07-25 DIAGNOSIS — R030 Elevated blood-pressure reading, without diagnosis of hypertension: Secondary | ICD-10-CM | POA: Diagnosis not present

## 2021-07-25 DIAGNOSIS — M545 Low back pain, unspecified: Secondary | ICD-10-CM | POA: Diagnosis not present

## 2021-07-25 DIAGNOSIS — R609 Edema, unspecified: Secondary | ICD-10-CM | POA: Diagnosis not present

## 2021-08-03 DIAGNOSIS — E242 Drug-induced Cushing's syndrome: Secondary | ICD-10-CM | POA: Diagnosis not present

## 2021-08-03 DIAGNOSIS — Z92241 Personal history of systemic steroid therapy: Secondary | ICD-10-CM | POA: Diagnosis not present

## 2021-08-03 DIAGNOSIS — E042 Nontoxic multinodular goiter: Secondary | ICD-10-CM | POA: Diagnosis not present

## 2021-08-03 DIAGNOSIS — E2749 Other adrenocortical insufficiency: Secondary | ICD-10-CM | POA: Diagnosis not present

## 2021-08-03 DIAGNOSIS — E0590 Thyrotoxicosis, unspecified without thyrotoxic crisis or storm: Secondary | ICD-10-CM | POA: Diagnosis not present

## 2021-08-04 ENCOUNTER — Other Ambulatory Visit: Payer: Self-pay | Admitting: Internal Medicine

## 2021-08-04 ENCOUNTER — Other Ambulatory Visit (HOSPITAL_COMMUNITY): Payer: Self-pay | Admitting: Internal Medicine

## 2021-08-04 DIAGNOSIS — E042 Nontoxic multinodular goiter: Secondary | ICD-10-CM

## 2021-08-04 DIAGNOSIS — E0590 Thyrotoxicosis, unspecified without thyrotoxic crisis or storm: Secondary | ICD-10-CM

## 2021-08-07 ENCOUNTER — Other Ambulatory Visit: Payer: Self-pay | Admitting: Cardiology

## 2021-08-07 DIAGNOSIS — M48061 Spinal stenosis, lumbar region without neurogenic claudication: Secondary | ICD-10-CM | POA: Diagnosis not present

## 2021-08-07 DIAGNOSIS — I10 Essential (primary) hypertension: Secondary | ICD-10-CM

## 2021-08-10 DIAGNOSIS — E042 Nontoxic multinodular goiter: Secondary | ICD-10-CM | POA: Diagnosis not present

## 2021-08-10 DIAGNOSIS — Z92241 Personal history of systemic steroid therapy: Secondary | ICD-10-CM | POA: Diagnosis not present

## 2021-08-10 DIAGNOSIS — E0590 Thyrotoxicosis, unspecified without thyrotoxic crisis or storm: Secondary | ICD-10-CM | POA: Diagnosis not present

## 2021-08-10 DIAGNOSIS — E2749 Other adrenocortical insufficiency: Secondary | ICD-10-CM | POA: Diagnosis not present

## 2021-08-10 DIAGNOSIS — E242 Drug-induced Cushing's syndrome: Secondary | ICD-10-CM | POA: Diagnosis not present

## 2021-08-11 DIAGNOSIS — E041 Nontoxic single thyroid nodule: Secondary | ICD-10-CM | POA: Diagnosis not present

## 2021-08-11 DIAGNOSIS — Z5321 Procedure and treatment not carried out due to patient leaving prior to being seen by health care provider: Secondary | ICD-10-CM | POA: Diagnosis not present

## 2021-08-11 DIAGNOSIS — R5383 Other fatigue: Secondary | ICD-10-CM | POA: Diagnosis not present

## 2021-08-11 DIAGNOSIS — R1310 Dysphagia, unspecified: Secondary | ICD-10-CM | POA: Diagnosis not present

## 2021-08-11 DIAGNOSIS — E042 Nontoxic multinodular goiter: Secondary | ICD-10-CM | POA: Diagnosis not present

## 2021-08-11 DIAGNOSIS — E0590 Thyrotoxicosis, unspecified without thyrotoxic crisis or storm: Secondary | ICD-10-CM | POA: Diagnosis not present

## 2021-08-11 DIAGNOSIS — R000 Tachycardia, unspecified: Secondary | ICD-10-CM | POA: Diagnosis not present

## 2021-08-13 DIAGNOSIS — R609 Edema, unspecified: Secondary | ICD-10-CM | POA: Diagnosis not present

## 2021-08-13 DIAGNOSIS — R030 Elevated blood-pressure reading, without diagnosis of hypertension: Secondary | ICD-10-CM | POA: Diagnosis not present

## 2021-08-13 DIAGNOSIS — E7800 Pure hypercholesterolemia, unspecified: Secondary | ICD-10-CM | POA: Diagnosis not present

## 2021-08-13 DIAGNOSIS — F988 Other specified behavioral and emotional disorders with onset usually occurring in childhood and adolescence: Secondary | ICD-10-CM | POA: Diagnosis not present

## 2021-08-13 DIAGNOSIS — M545 Low back pain, unspecified: Secondary | ICD-10-CM | POA: Diagnosis not present

## 2021-08-13 DIAGNOSIS — R0609 Other forms of dyspnea: Secondary | ICD-10-CM | POA: Diagnosis not present

## 2021-08-13 DIAGNOSIS — R251 Tremor, unspecified: Secondary | ICD-10-CM | POA: Diagnosis not present

## 2021-08-13 DIAGNOSIS — M818 Other osteoporosis without current pathological fracture: Secondary | ICD-10-CM | POA: Diagnosis not present

## 2021-08-13 DIAGNOSIS — M159 Polyosteoarthritis, unspecified: Secondary | ICD-10-CM | POA: Diagnosis not present

## 2021-08-13 DIAGNOSIS — K589 Irritable bowel syndrome without diarrhea: Secondary | ICD-10-CM | POA: Diagnosis not present

## 2021-08-13 DIAGNOSIS — F3341 Major depressive disorder, recurrent, in partial remission: Secondary | ICD-10-CM | POA: Diagnosis not present

## 2021-08-13 DIAGNOSIS — E876 Hypokalemia: Secondary | ICD-10-CM | POA: Diagnosis not present

## 2021-08-14 DIAGNOSIS — Z981 Arthrodesis status: Secondary | ICD-10-CM | POA: Diagnosis not present

## 2021-08-14 DIAGNOSIS — M545 Low back pain, unspecified: Secondary | ICD-10-CM | POA: Diagnosis not present

## 2021-08-14 DIAGNOSIS — M48061 Spinal stenosis, lumbar region without neurogenic claudication: Secondary | ICD-10-CM | POA: Diagnosis not present

## 2021-08-20 DIAGNOSIS — Q159 Congenital malformation of eye, unspecified: Secondary | ICD-10-CM | POA: Diagnosis not present

## 2021-08-20 DIAGNOSIS — E0590 Thyrotoxicosis, unspecified without thyrotoxic crisis or storm: Secondary | ICD-10-CM | POA: Diagnosis not present

## 2021-08-20 DIAGNOSIS — E042 Nontoxic multinodular goiter: Secondary | ICD-10-CM | POA: Diagnosis not present

## 2021-08-20 DIAGNOSIS — E242 Drug-induced Cushing's syndrome: Secondary | ICD-10-CM | POA: Diagnosis not present

## 2021-08-20 DIAGNOSIS — Z92241 Personal history of systemic steroid therapy: Secondary | ICD-10-CM | POA: Diagnosis not present

## 2021-08-20 DIAGNOSIS — E2749 Other adrenocortical insufficiency: Secondary | ICD-10-CM | POA: Diagnosis not present

## 2021-08-24 DIAGNOSIS — E0590 Thyrotoxicosis, unspecified without thyrotoxic crisis or storm: Secondary | ICD-10-CM | POA: Diagnosis not present

## 2021-08-24 DIAGNOSIS — Q159 Congenital malformation of eye, unspecified: Secondary | ICD-10-CM | POA: Diagnosis not present

## 2021-08-24 DIAGNOSIS — E2749 Other adrenocortical insufficiency: Secondary | ICD-10-CM | POA: Diagnosis not present

## 2021-08-25 DIAGNOSIS — M5417 Radiculopathy, lumbosacral region: Secondary | ICD-10-CM | POA: Diagnosis not present

## 2021-08-25 DIAGNOSIS — R030 Elevated blood-pressure reading, without diagnosis of hypertension: Secondary | ICD-10-CM | POA: Diagnosis not present

## 2021-08-25 DIAGNOSIS — F3341 Major depressive disorder, recurrent, in partial remission: Secondary | ICD-10-CM | POA: Diagnosis not present

## 2021-08-25 DIAGNOSIS — M545 Low back pain, unspecified: Secondary | ICD-10-CM | POA: Diagnosis not present

## 2021-08-25 DIAGNOSIS — F988 Other specified behavioral and emotional disorders with onset usually occurring in childhood and adolescence: Secondary | ICD-10-CM | POA: Diagnosis not present

## 2021-08-25 DIAGNOSIS — R0609 Other forms of dyspnea: Secondary | ICD-10-CM | POA: Diagnosis not present

## 2021-08-25 DIAGNOSIS — M159 Polyosteoarthritis, unspecified: Secondary | ICD-10-CM | POA: Diagnosis not present

## 2021-08-25 DIAGNOSIS — M818 Other osteoporosis without current pathological fracture: Secondary | ICD-10-CM | POA: Diagnosis not present

## 2021-08-25 DIAGNOSIS — R609 Edema, unspecified: Secondary | ICD-10-CM | POA: Diagnosis not present

## 2021-08-25 DIAGNOSIS — K589 Irritable bowel syndrome without diarrhea: Secondary | ICD-10-CM | POA: Diagnosis not present

## 2021-08-25 DIAGNOSIS — R251 Tremor, unspecified: Secondary | ICD-10-CM | POA: Diagnosis not present

## 2021-08-25 DIAGNOSIS — E876 Hypokalemia: Secondary | ICD-10-CM | POA: Diagnosis not present

## 2021-08-27 DIAGNOSIS — M5416 Radiculopathy, lumbar region: Secondary | ICD-10-CM | POA: Diagnosis not present

## 2021-09-01 DIAGNOSIS — R0609 Other forms of dyspnea: Secondary | ICD-10-CM | POA: Diagnosis not present

## 2021-09-01 DIAGNOSIS — F988 Other specified behavioral and emotional disorders with onset usually occurring in childhood and adolescence: Secondary | ICD-10-CM | POA: Diagnosis not present

## 2021-09-01 DIAGNOSIS — R609 Edema, unspecified: Secondary | ICD-10-CM | POA: Diagnosis not present

## 2021-09-01 DIAGNOSIS — R251 Tremor, unspecified: Secondary | ICD-10-CM | POA: Diagnosis not present

## 2021-09-01 DIAGNOSIS — R030 Elevated blood-pressure reading, without diagnosis of hypertension: Secondary | ICD-10-CM | POA: Diagnosis not present

## 2021-09-01 DIAGNOSIS — M818 Other osteoporosis without current pathological fracture: Secondary | ICD-10-CM | POA: Diagnosis not present

## 2021-09-01 DIAGNOSIS — Z6825 Body mass index (BMI) 25.0-25.9, adult: Secondary | ICD-10-CM | POA: Diagnosis not present

## 2021-09-01 DIAGNOSIS — F3341 Major depressive disorder, recurrent, in partial remission: Secondary | ICD-10-CM | POA: Diagnosis not present

## 2021-09-01 DIAGNOSIS — E876 Hypokalemia: Secondary | ICD-10-CM | POA: Diagnosis not present

## 2021-09-01 DIAGNOSIS — M545 Low back pain, unspecified: Secondary | ICD-10-CM | POA: Diagnosis not present

## 2021-09-01 DIAGNOSIS — M5417 Radiculopathy, lumbosacral region: Secondary | ICD-10-CM | POA: Diagnosis not present

## 2021-09-01 DIAGNOSIS — M159 Polyosteoarthritis, unspecified: Secondary | ICD-10-CM | POA: Diagnosis not present

## 2021-09-03 DIAGNOSIS — M5416 Radiculopathy, lumbar region: Secondary | ICD-10-CM | POA: Diagnosis not present

## 2021-09-03 DIAGNOSIS — M5116 Intervertebral disc disorders with radiculopathy, lumbar region: Secondary | ICD-10-CM | POA: Diagnosis not present

## 2021-09-08 DIAGNOSIS — Z92241 Personal history of systemic steroid therapy: Secondary | ICD-10-CM | POA: Diagnosis not present

## 2021-09-08 DIAGNOSIS — Q159 Congenital malformation of eye, unspecified: Secondary | ICD-10-CM | POA: Diagnosis not present

## 2021-09-08 DIAGNOSIS — E0590 Thyrotoxicosis, unspecified without thyrotoxic crisis or storm: Secondary | ICD-10-CM | POA: Diagnosis not present

## 2021-09-08 DIAGNOSIS — E2749 Other adrenocortical insufficiency: Secondary | ICD-10-CM | POA: Diagnosis not present

## 2021-09-08 DIAGNOSIS — E042 Nontoxic multinodular goiter: Secondary | ICD-10-CM | POA: Diagnosis not present

## 2021-09-08 DIAGNOSIS — E242 Drug-induced Cushing's syndrome: Secondary | ICD-10-CM | POA: Diagnosis not present

## 2021-09-10 DIAGNOSIS — F3341 Major depressive disorder, recurrent, in partial remission: Secondary | ICD-10-CM | POA: Diagnosis not present

## 2021-09-10 DIAGNOSIS — M818 Other osteoporosis without current pathological fracture: Secondary | ICD-10-CM | POA: Diagnosis not present

## 2021-09-10 DIAGNOSIS — R233 Spontaneous ecchymoses: Secondary | ICD-10-CM | POA: Diagnosis not present

## 2021-09-10 DIAGNOSIS — M159 Polyosteoarthritis, unspecified: Secondary | ICD-10-CM | POA: Diagnosis not present

## 2021-09-10 DIAGNOSIS — R251 Tremor, unspecified: Secondary | ICD-10-CM | POA: Diagnosis not present

## 2021-09-10 DIAGNOSIS — R030 Elevated blood-pressure reading, without diagnosis of hypertension: Secondary | ICD-10-CM | POA: Diagnosis not present

## 2021-09-10 DIAGNOSIS — J208 Acute bronchitis due to other specified organisms: Secondary | ICD-10-CM | POA: Diagnosis not present

## 2021-09-10 DIAGNOSIS — B9689 Other specified bacterial agents as the cause of diseases classified elsewhere: Secondary | ICD-10-CM | POA: Diagnosis not present

## 2021-09-10 DIAGNOSIS — F988 Other specified behavioral and emotional disorders with onset usually occurring in childhood and adolescence: Secondary | ICD-10-CM | POA: Diagnosis not present

## 2021-09-10 DIAGNOSIS — R609 Edema, unspecified: Secondary | ICD-10-CM | POA: Diagnosis not present

## 2021-09-10 DIAGNOSIS — E876 Hypokalemia: Secondary | ICD-10-CM | POA: Diagnosis not present

## 2021-09-10 DIAGNOSIS — M5417 Radiculopathy, lumbosacral region: Secondary | ICD-10-CM | POA: Diagnosis not present

## 2021-09-18 ENCOUNTER — Telehealth: Payer: Self-pay | Admitting: Oncology

## 2021-09-18 NOTE — Telephone Encounter (Signed)
Scheduled appt per 9/23 referral. PT is aware of appt date and time.

## 2021-09-21 DIAGNOSIS — M159 Polyosteoarthritis, unspecified: Secondary | ICD-10-CM | POA: Diagnosis not present

## 2021-09-21 DIAGNOSIS — E7800 Pure hypercholesterolemia, unspecified: Secondary | ICD-10-CM | POA: Diagnosis not present

## 2021-09-21 DIAGNOSIS — E876 Hypokalemia: Secondary | ICD-10-CM | POA: Diagnosis not present

## 2021-09-21 DIAGNOSIS — S0990XA Unspecified injury of head, initial encounter: Secondary | ICD-10-CM | POA: Diagnosis not present

## 2021-09-21 DIAGNOSIS — L0390 Cellulitis, unspecified: Secondary | ICD-10-CM | POA: Diagnosis not present

## 2021-09-21 DIAGNOSIS — R0609 Other forms of dyspnea: Secondary | ICD-10-CM | POA: Diagnosis not present

## 2021-09-21 DIAGNOSIS — F3341 Major depressive disorder, recurrent, in partial remission: Secondary | ICD-10-CM | POA: Diagnosis not present

## 2021-09-21 DIAGNOSIS — M818 Other osteoporosis without current pathological fracture: Secondary | ICD-10-CM | POA: Diagnosis not present

## 2021-09-21 DIAGNOSIS — R251 Tremor, unspecified: Secondary | ICD-10-CM | POA: Diagnosis not present

## 2021-09-21 DIAGNOSIS — R609 Edema, unspecified: Secondary | ICD-10-CM | POA: Diagnosis not present

## 2021-09-21 DIAGNOSIS — F988 Other specified behavioral and emotional disorders with onset usually occurring in childhood and adolescence: Secondary | ICD-10-CM | POA: Diagnosis not present

## 2021-09-21 DIAGNOSIS — R030 Elevated blood-pressure reading, without diagnosis of hypertension: Secondary | ICD-10-CM | POA: Diagnosis not present

## 2021-09-22 DIAGNOSIS — Q159 Congenital malformation of eye, unspecified: Secondary | ICD-10-CM | POA: Diagnosis not present

## 2021-09-22 DIAGNOSIS — E042 Nontoxic multinodular goiter: Secondary | ICD-10-CM | POA: Diagnosis not present

## 2021-09-22 DIAGNOSIS — E2749 Other adrenocortical insufficiency: Secondary | ICD-10-CM | POA: Diagnosis not present

## 2021-09-22 DIAGNOSIS — S0003XA Contusion of scalp, initial encounter: Secondary | ICD-10-CM | POA: Diagnosis not present

## 2021-09-22 DIAGNOSIS — S0990XA Unspecified injury of head, initial encounter: Secondary | ICD-10-CM | POA: Diagnosis not present

## 2021-09-22 DIAGNOSIS — E0590 Thyrotoxicosis, unspecified without thyrotoxic crisis or storm: Secondary | ICD-10-CM | POA: Diagnosis not present

## 2021-09-22 DIAGNOSIS — E242 Drug-induced Cushing's syndrome: Secondary | ICD-10-CM | POA: Diagnosis not present

## 2021-09-22 DIAGNOSIS — Z92241 Personal history of systemic steroid therapy: Secondary | ICD-10-CM | POA: Diagnosis not present

## 2021-09-23 ENCOUNTER — Ambulatory Visit: Payer: Medicare Other | Admitting: Internal Medicine

## 2021-09-28 DIAGNOSIS — R251 Tremor, unspecified: Secondary | ICD-10-CM | POA: Diagnosis not present

## 2021-09-28 DIAGNOSIS — R0609 Other forms of dyspnea: Secondary | ICD-10-CM | POA: Diagnosis not present

## 2021-09-28 DIAGNOSIS — E876 Hypokalemia: Secondary | ICD-10-CM | POA: Diagnosis not present

## 2021-09-28 DIAGNOSIS — M159 Polyosteoarthritis, unspecified: Secondary | ICD-10-CM | POA: Diagnosis not present

## 2021-09-28 DIAGNOSIS — F988 Other specified behavioral and emotional disorders with onset usually occurring in childhood and adolescence: Secondary | ICD-10-CM | POA: Diagnosis not present

## 2021-09-28 DIAGNOSIS — R030 Elevated blood-pressure reading, without diagnosis of hypertension: Secondary | ICD-10-CM | POA: Diagnosis not present

## 2021-09-28 DIAGNOSIS — M818 Other osteoporosis without current pathological fracture: Secondary | ICD-10-CM | POA: Diagnosis not present

## 2021-09-28 DIAGNOSIS — E7800 Pure hypercholesterolemia, unspecified: Secondary | ICD-10-CM | POA: Diagnosis not present

## 2021-09-28 DIAGNOSIS — R609 Edema, unspecified: Secondary | ICD-10-CM | POA: Diagnosis not present

## 2021-09-28 DIAGNOSIS — F3341 Major depressive disorder, recurrent, in partial remission: Secondary | ICD-10-CM | POA: Diagnosis not present

## 2021-09-28 DIAGNOSIS — M545 Low back pain, unspecified: Secondary | ICD-10-CM | POA: Diagnosis not present

## 2021-09-28 DIAGNOSIS — L0390 Cellulitis, unspecified: Secondary | ICD-10-CM | POA: Diagnosis not present

## 2021-10-01 ENCOUNTER — Other Ambulatory Visit: Payer: Self-pay | Admitting: Oncology

## 2021-10-01 DIAGNOSIS — R233 Spontaneous ecchymoses: Secondary | ICD-10-CM

## 2021-10-02 ENCOUNTER — Other Ambulatory Visit: Payer: Self-pay | Admitting: Hematology and Oncology

## 2021-10-02 ENCOUNTER — Inpatient Hospital Stay: Payer: Medicare Other

## 2021-10-02 ENCOUNTER — Inpatient Hospital Stay: Payer: Medicare Other | Attending: Oncology | Admitting: Oncology

## 2021-10-02 ENCOUNTER — Encounter: Payer: Self-pay | Admitting: Oncology

## 2021-10-02 DIAGNOSIS — R233 Spontaneous ecchymoses: Secondary | ICD-10-CM | POA: Diagnosis not present

## 2021-10-02 DIAGNOSIS — E538 Deficiency of other specified B group vitamins: Secondary | ICD-10-CM | POA: Diagnosis not present

## 2021-10-02 DIAGNOSIS — D692 Other nonthrombocytopenic purpura: Secondary | ICD-10-CM | POA: Diagnosis not present

## 2021-10-02 DIAGNOSIS — D649 Anemia, unspecified: Secondary | ICD-10-CM | POA: Diagnosis not present

## 2021-10-02 LAB — HEPATIC FUNCTION PANEL
ALT: 30 (ref 7–35)
AST: 33 (ref 13–35)
Alkaline Phosphatase: 86 (ref 25–125)
Bilirubin, Total: 0.7

## 2021-10-02 LAB — PROTIME-INR
INR: 0.9 (ref 0.8–1.2)
Prothrombin Time: 11.9 seconds (ref 11.4–15.2)

## 2021-10-02 LAB — BASIC METABOLIC PANEL
BUN: 33 — AB (ref 4–21)
CO2: 30 — AB (ref 13–22)
Chloride: 101 (ref 99–108)
Creatinine: 1.3 — AB (ref 0.5–1.1)
Glucose: 128
Potassium: 4.6 (ref 3.4–5.3)
Sodium: 138 (ref 137–147)

## 2021-10-02 LAB — CBC AND DIFFERENTIAL
HCT: 36 (ref 36–46)
Hemoglobin: 12.3 (ref 12.0–16.0)
Neutrophils Absolute: 7.13
Platelets: 302 (ref 150–399)
WBC: 9.5

## 2021-10-02 LAB — COMPREHENSIVE METABOLIC PANEL
Albumin: 3.9 (ref 3.5–5.0)
Calcium: 9 (ref 8.7–10.7)

## 2021-10-02 LAB — CBC: RBC: 3.82 — AB (ref 3.87–5.11)

## 2021-10-02 LAB — APTT: aPTT: 26 seconds (ref 24–36)

## 2021-10-03 LAB — VON WILLEBRAND PANEL
Coagulation Factor VIII: 296 % — ABNORMAL HIGH (ref 56–140)
Ristocetin Co-factor, Plasma: 343 % — ABNORMAL HIGH (ref 50–200)
Von Willebrand Antigen, Plasma: 398 % — ABNORMAL HIGH (ref 50–200)

## 2021-10-03 LAB — COAG STUDIES INTERP REPORT

## 2021-10-05 DIAGNOSIS — E876 Hypokalemia: Secondary | ICD-10-CM | POA: Diagnosis not present

## 2021-10-05 DIAGNOSIS — M159 Polyosteoarthritis, unspecified: Secondary | ICD-10-CM | POA: Diagnosis not present

## 2021-10-05 DIAGNOSIS — F988 Other specified behavioral and emotional disorders with onset usually occurring in childhood and adolescence: Secondary | ICD-10-CM | POA: Diagnosis not present

## 2021-10-05 DIAGNOSIS — F3341 Major depressive disorder, recurrent, in partial remission: Secondary | ICD-10-CM | POA: Diagnosis not present

## 2021-10-05 DIAGNOSIS — R030 Elevated blood-pressure reading, without diagnosis of hypertension: Secondary | ICD-10-CM | POA: Diagnosis not present

## 2021-10-05 DIAGNOSIS — Z6825 Body mass index (BMI) 25.0-25.9, adult: Secondary | ICD-10-CM | POA: Diagnosis not present

## 2021-10-05 DIAGNOSIS — R251 Tremor, unspecified: Secondary | ICD-10-CM | POA: Diagnosis not present

## 2021-10-05 DIAGNOSIS — E7800 Pure hypercholesterolemia, unspecified: Secondary | ICD-10-CM | POA: Diagnosis not present

## 2021-10-05 DIAGNOSIS — E538 Deficiency of other specified B group vitamins: Secondary | ICD-10-CM | POA: Diagnosis not present

## 2021-10-05 DIAGNOSIS — M818 Other osteoporosis without current pathological fracture: Secondary | ICD-10-CM | POA: Diagnosis not present

## 2021-10-05 DIAGNOSIS — R609 Edema, unspecified: Secondary | ICD-10-CM | POA: Diagnosis not present

## 2021-10-06 DIAGNOSIS — S0101XA Laceration without foreign body of scalp, initial encounter: Secondary | ICD-10-CM | POA: Diagnosis not present

## 2021-10-07 DIAGNOSIS — S0101XA Laceration without foreign body of scalp, initial encounter: Secondary | ICD-10-CM | POA: Diagnosis not present

## 2021-10-07 DIAGNOSIS — Z23 Encounter for immunization: Secondary | ICD-10-CM | POA: Diagnosis not present

## 2021-10-09 DIAGNOSIS — S81802A Unspecified open wound, left lower leg, initial encounter: Secondary | ICD-10-CM | POA: Diagnosis not present

## 2021-10-09 DIAGNOSIS — S0101XA Laceration without foreign body of scalp, initial encounter: Secondary | ICD-10-CM | POA: Diagnosis not present

## 2021-10-13 DIAGNOSIS — S81802A Unspecified open wound, left lower leg, initial encounter: Secondary | ICD-10-CM | POA: Diagnosis not present

## 2021-10-13 DIAGNOSIS — S0101XA Laceration without foreign body of scalp, initial encounter: Secondary | ICD-10-CM | POA: Diagnosis not present

## 2021-10-14 DIAGNOSIS — S0101XA Laceration without foreign body of scalp, initial encounter: Secondary | ICD-10-CM | POA: Diagnosis not present

## 2021-10-14 NOTE — Progress Notes (Signed)
[facility]  [address] [place],  [place]  [address] [phone]  Clinic Day:  10/02/2021  Referring physician: Cher Nakai, MD   HISTORY OF PRESENT ILLNESS:  The patient is a 75 y.o. female  who I was asked to re-evaluate for ecchymoses.  She recently fell, which led to a hematoma developing.    She complains of having bruises over her forearms and bilateral lower legs.  However, she denies having bleeding/bruising issues elsewhere with her body.  She also denies having problems with epistaxis or gingival bleeding.  She claims to have had normal menstrual cycles while premenopausal.  She denies having problems with postsurgical bleeding. There is no personal history of hemarthroses.  There is no family history of bleeding disorders.  Of note, this patient was seen by me in 2012 for the same problem; she was also seen by me in November 2021 for this.  She has had an extensive workup, including screening for von Willebrand's disease and coagulopathies, all of which came back negative.  The patient does take Celebrex for arthritis, but denies being on any other type of blood thinner.  She also takes hydrocortisone for adrenal insufficiency.    REVIEW OF SYSTEMS:  Review of Systems  Constitutional:  Negative for fatigue and fever.  HENT:   Positive for hearing loss. Negative for sore throat.   Eyes:  Negative for eye problems.  Respiratory:  Positive for cough (attributes this to her GERD). Negative for chest tightness and hemoptysis.   Cardiovascular:  Negative for chest pain and palpitations.  Gastrointestinal:  Negative for abdominal distention, abdominal pain, blood in stool, constipation, diarrhea, nausea and vomiting.  Endocrine: Negative for hot flashes.  Genitourinary:  Negative for difficulty urinating, dysuria, frequency, hematuria and nocturia.   Musculoskeletal:  Positive for arthralgias. Negative for back pain, gait problem and myalgias.  Skin:   Positive for rash (extremity bruising). Negative for itching.  Neurological: Negative.  Negative for dizziness, extremity weakness, gait problem, headaches, light-headedness and numbness.  Hematological: Negative.   Psychiatric/Behavioral:  Positive for depression. Negative for suicidal ideas. The patient is not nervous/anxious.  Decreased energy level  PHYSICAL EXAM:  Blood pressure (!) 153/88, pulse 75, temperature 98.1 F (36.7 C), resp. rate 16, height 5\' 5"  (1.651 m), weight 133 lb 14.4 oz (60.7 kg), SpO2 97 %. Wt Readings from Last 3 Encounters:  10/02/21 133 lb 14.4 oz (60.7 kg)  04/17/21 133 lb (60.3 kg)  02/26/21 128 lb 6.4 oz (58.2 kg)   Body mass index is 22.28 kg/m. Performance status (ECOG): 0 GENERAL: The patient is alert and oriented x3, in no acute distress. HEENT EXAM: Clear oropharynx with no exudate or lesions appreciated. LUNG EXAM: Clear to auscultation bilaterally. CARDIAC EXAM: Regular rate and rhythm. No murmurs, rubs, or gallops. ABDOMINAL EXAM: Soft, nontender and nondistended; no hepatosplenomegaly; normal abdominal bowel sounds EXTREMITY EXAM: No clubbing, cyanosis, or edema. LYMPH NODE SURVEY: No palpable cervical, supraclavicular, axillary, or inguinal lymphadenopathy. NEUROLOGIC EXAM: Cranial nerves II-XII and cerebellar functions are grossly intact. SKIN EXAM: Areas of ecchymoses are appreciated over her forearms and lower legs.  Veins are clearly visible coursing through her skin, which appears thin and friable.   LABS:    Ref. Range 10/02/2021 00:00  Sodium Latest Ref Range: 137 - 147  138  Potassium Latest Ref Range: 3.4 - 5.3  4.6  Chloride Latest Ref Range: 99 - 108  101  CO2 Latest Ref Range: 13 - 22  30 (A)  Glucose Unknown 128  BUN Latest Ref Range: 4 - 21  33 (A)  Creatinine Latest Ref Range: 0.5 - 1.1  1.3 (A)  Calcium Latest Ref Range: 8.7 - 10.7  9.0  Alkaline Phosphatase Latest Ref Range: 25 - 125  86  Albumin Latest Ref Range: 3.5 -  5.0  3.9  AST Latest Ref Range: 13 - 35  33  ALT Latest Ref Range: 7 - 35  30  Bilirubin, Total Unknown 0.7  WBC Unknown 9.5  RBC Latest Ref Range: 3.87 - 5.11  3.82 (A)  Hemoglobin Latest Ref Range: 12.0 - 16.0  12.3  HCT Latest Ref Range: 36 - 46  36  Platelets Latest Ref Range: 150 - 399  302  NEUT# Unknown 7.13    Ref. Range 10/02/2021 15:08  Prothrombin Time Latest Ref Range: 11.4 - 15.2 seconds 11.9  INR Latest Ref Range: 0.8 - 1.2  0.9  APTT Latest Ref Range: 24 - 36 seconds 26      Ref. Range 11/06/2020 14:16  Prothrombin Time Latest Ref Range: 11.4 - 15.2 seconds 11.4  INR Latest Ref Range: 0.8 - 1.2  0.9  APTT Latest Ref Range: 24 - 36 seconds 25   ASSESSMENT & PLAN:  A 75 y.o. female who I was asked to re-evaluate for ecchymoses/easy bruising.  In clinic today, I reviewed current and previous labs with her, neither of which showed any evidence of an underlying bleeding disorder being present.  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  This now marks the 3rd time she has seen me for this same problem, for which all visits have never pointed to an underlying hematologic disorder being present.  I reassured her no ominous disease process is present.  I do feel comfortable turning her care back over to her primary care.  The patient and her husband understand all the plans discussed today and are in agreement with them.   Merleen Picazo Macarthur Critchley, MD

## 2021-10-15 DIAGNOSIS — R233 Spontaneous ecchymoses: Secondary | ICD-10-CM | POA: Insufficient documentation

## 2021-10-19 DIAGNOSIS — S0101XA Laceration without foreign body of scalp, initial encounter: Secondary | ICD-10-CM | POA: Diagnosis not present

## 2021-10-19 DIAGNOSIS — S81802A Unspecified open wound, left lower leg, initial encounter: Secondary | ICD-10-CM | POA: Diagnosis not present

## 2021-10-20 DIAGNOSIS — B9689 Other specified bacterial agents as the cause of diseases classified elsewhere: Secondary | ICD-10-CM | POA: Diagnosis not present

## 2021-10-20 DIAGNOSIS — M159 Polyosteoarthritis, unspecified: Secondary | ICD-10-CM | POA: Diagnosis not present

## 2021-10-20 DIAGNOSIS — F3341 Major depressive disorder, recurrent, in partial remission: Secondary | ICD-10-CM | POA: Diagnosis not present

## 2021-10-20 DIAGNOSIS — J208 Acute bronchitis due to other specified organisms: Secondary | ICD-10-CM | POA: Diagnosis not present

## 2021-10-20 DIAGNOSIS — R5382 Chronic fatigue, unspecified: Secondary | ICD-10-CM | POA: Diagnosis not present

## 2021-10-20 DIAGNOSIS — M818 Other osteoporosis without current pathological fracture: Secondary | ICD-10-CM | POA: Diagnosis not present

## 2021-10-20 DIAGNOSIS — E876 Hypokalemia: Secondary | ICD-10-CM | POA: Diagnosis not present

## 2021-10-20 DIAGNOSIS — M5417 Radiculopathy, lumbosacral region: Secondary | ICD-10-CM | POA: Diagnosis not present

## 2021-10-20 DIAGNOSIS — R030 Elevated blood-pressure reading, without diagnosis of hypertension: Secondary | ICD-10-CM | POA: Diagnosis not present

## 2021-10-20 DIAGNOSIS — R609 Edema, unspecified: Secondary | ICD-10-CM | POA: Diagnosis not present

## 2021-10-20 DIAGNOSIS — E7800 Pure hypercholesterolemia, unspecified: Secondary | ICD-10-CM | POA: Diagnosis not present

## 2021-10-20 DIAGNOSIS — F988 Other specified behavioral and emotional disorders with onset usually occurring in childhood and adolescence: Secondary | ICD-10-CM | POA: Diagnosis not present

## 2021-10-26 DIAGNOSIS — E2749 Other adrenocortical insufficiency: Secondary | ICD-10-CM | POA: Diagnosis not present

## 2021-10-26 DIAGNOSIS — Z92241 Personal history of systemic steroid therapy: Secondary | ICD-10-CM | POA: Diagnosis not present

## 2021-10-26 DIAGNOSIS — E242 Drug-induced Cushing's syndrome: Secondary | ICD-10-CM | POA: Diagnosis not present

## 2021-10-26 DIAGNOSIS — E042 Nontoxic multinodular goiter: Secondary | ICD-10-CM | POA: Diagnosis not present

## 2021-10-26 DIAGNOSIS — E230 Hypopituitarism: Secondary | ICD-10-CM | POA: Diagnosis not present

## 2021-10-27 DIAGNOSIS — R030 Elevated blood-pressure reading, without diagnosis of hypertension: Secondary | ICD-10-CM | POA: Diagnosis not present

## 2021-10-27 DIAGNOSIS — M5417 Radiculopathy, lumbosacral region: Secondary | ICD-10-CM | POA: Diagnosis not present

## 2021-10-27 DIAGNOSIS — F3341 Major depressive disorder, recurrent, in partial remission: Secondary | ICD-10-CM | POA: Diagnosis not present

## 2021-10-27 DIAGNOSIS — F988 Other specified behavioral and emotional disorders with onset usually occurring in childhood and adolescence: Secondary | ICD-10-CM | POA: Diagnosis not present

## 2021-10-27 DIAGNOSIS — M818 Other osteoporosis without current pathological fracture: Secondary | ICD-10-CM | POA: Diagnosis not present

## 2021-10-27 DIAGNOSIS — R609 Edema, unspecified: Secondary | ICD-10-CM | POA: Diagnosis not present

## 2021-10-27 DIAGNOSIS — R5382 Chronic fatigue, unspecified: Secondary | ICD-10-CM | POA: Diagnosis not present

## 2021-10-27 DIAGNOSIS — R0609 Other forms of dyspnea: Secondary | ICD-10-CM | POA: Diagnosis not present

## 2021-10-27 DIAGNOSIS — R251 Tremor, unspecified: Secondary | ICD-10-CM | POA: Diagnosis not present

## 2021-10-27 DIAGNOSIS — E876 Hypokalemia: Secondary | ICD-10-CM | POA: Diagnosis not present

## 2021-10-27 DIAGNOSIS — E7800 Pure hypercholesterolemia, unspecified: Secondary | ICD-10-CM | POA: Diagnosis not present

## 2021-10-27 DIAGNOSIS — M159 Polyosteoarthritis, unspecified: Secondary | ICD-10-CM | POA: Diagnosis not present

## 2021-11-04 DIAGNOSIS — S0101XA Laceration without foreign body of scalp, initial encounter: Secondary | ICD-10-CM | POA: Diagnosis not present

## 2021-11-04 DIAGNOSIS — S81802A Unspecified open wound, left lower leg, initial encounter: Secondary | ICD-10-CM | POA: Diagnosis not present

## 2021-11-05 ENCOUNTER — Other Ambulatory Visit: Payer: Self-pay | Admitting: Cardiology

## 2021-11-05 DIAGNOSIS — I10 Essential (primary) hypertension: Secondary | ICD-10-CM

## 2021-11-06 DIAGNOSIS — E230 Hypopituitarism: Secondary | ICD-10-CM | POA: Diagnosis not present

## 2021-11-06 DIAGNOSIS — G319 Degenerative disease of nervous system, unspecified: Secondary | ICD-10-CM | POA: Diagnosis not present

## 2021-11-06 DIAGNOSIS — E2749 Other adrenocortical insufficiency: Secondary | ICD-10-CM | POA: Diagnosis not present

## 2021-11-12 DIAGNOSIS — M159 Polyosteoarthritis, unspecified: Secondary | ICD-10-CM | POA: Diagnosis not present

## 2021-11-12 DIAGNOSIS — R030 Elevated blood-pressure reading, without diagnosis of hypertension: Secondary | ICD-10-CM | POA: Diagnosis not present

## 2021-11-12 DIAGNOSIS — R251 Tremor, unspecified: Secondary | ICD-10-CM | POA: Diagnosis not present

## 2021-11-12 DIAGNOSIS — F3341 Major depressive disorder, recurrent, in partial remission: Secondary | ICD-10-CM | POA: Diagnosis not present

## 2021-11-12 DIAGNOSIS — M5417 Radiculopathy, lumbosacral region: Secondary | ICD-10-CM | POA: Diagnosis not present

## 2021-11-12 DIAGNOSIS — M818 Other osteoporosis without current pathological fracture: Secondary | ICD-10-CM | POA: Diagnosis not present

## 2021-11-12 DIAGNOSIS — R0609 Other forms of dyspnea: Secondary | ICD-10-CM | POA: Diagnosis not present

## 2021-11-12 DIAGNOSIS — F988 Other specified behavioral and emotional disorders with onset usually occurring in childhood and adolescence: Secondary | ICD-10-CM | POA: Diagnosis not present

## 2021-11-12 DIAGNOSIS — R609 Edema, unspecified: Secondary | ICD-10-CM | POA: Diagnosis not present

## 2021-11-12 DIAGNOSIS — E876 Hypokalemia: Secondary | ICD-10-CM | POA: Diagnosis not present

## 2021-11-12 DIAGNOSIS — E7800 Pure hypercholesterolemia, unspecified: Secondary | ICD-10-CM | POA: Diagnosis not present

## 2021-11-12 DIAGNOSIS — M545 Low back pain, unspecified: Secondary | ICD-10-CM | POA: Diagnosis not present

## 2021-11-13 DIAGNOSIS — M5416 Radiculopathy, lumbar region: Secondary | ICD-10-CM | POA: Diagnosis not present

## 2021-11-17 DIAGNOSIS — S81802A Unspecified open wound, left lower leg, initial encounter: Secondary | ICD-10-CM | POA: Diagnosis not present

## 2021-11-24 DIAGNOSIS — E230 Hypopituitarism: Secondary | ICD-10-CM | POA: Diagnosis not present

## 2021-11-24 DIAGNOSIS — E2749 Other adrenocortical insufficiency: Secondary | ICD-10-CM | POA: Diagnosis not present

## 2021-11-24 DIAGNOSIS — E042 Nontoxic multinodular goiter: Secondary | ICD-10-CM | POA: Diagnosis not present

## 2021-11-24 DIAGNOSIS — Z92241 Personal history of systemic steroid therapy: Secondary | ICD-10-CM | POA: Diagnosis not present

## 2021-11-24 DIAGNOSIS — E242 Drug-induced Cushing's syndrome: Secondary | ICD-10-CM | POA: Diagnosis not present

## 2021-12-07 DIAGNOSIS — Z8669 Personal history of other diseases of the nervous system and sense organs: Secondary | ICD-10-CM | POA: Diagnosis not present

## 2021-12-07 DIAGNOSIS — H9319 Tinnitus, unspecified ear: Secondary | ICD-10-CM | POA: Diagnosis not present

## 2021-12-07 DIAGNOSIS — J342 Deviated nasal septum: Secondary | ICD-10-CM | POA: Diagnosis not present

## 2021-12-07 DIAGNOSIS — H9190 Unspecified hearing loss, unspecified ear: Secondary | ICD-10-CM | POA: Diagnosis not present

## 2021-12-07 DIAGNOSIS — D170 Benign lipomatous neoplasm of skin and subcutaneous tissue of head, face and neck: Secondary | ICD-10-CM | POA: Diagnosis not present

## 2021-12-08 DIAGNOSIS — S81802A Unspecified open wound, left lower leg, initial encounter: Secondary | ICD-10-CM | POA: Diagnosis not present

## 2021-12-10 DIAGNOSIS — R609 Edema, unspecified: Secondary | ICD-10-CM | POA: Diagnosis not present

## 2021-12-10 DIAGNOSIS — J069 Acute upper respiratory infection, unspecified: Secondary | ICD-10-CM | POA: Diagnosis not present

## 2021-12-10 DIAGNOSIS — F3341 Major depressive disorder, recurrent, in partial remission: Secondary | ICD-10-CM | POA: Diagnosis not present

## 2021-12-10 DIAGNOSIS — R0609 Other forms of dyspnea: Secondary | ICD-10-CM | POA: Diagnosis not present

## 2021-12-10 DIAGNOSIS — M159 Polyosteoarthritis, unspecified: Secondary | ICD-10-CM | POA: Diagnosis not present

## 2021-12-10 DIAGNOSIS — F988 Other specified behavioral and emotional disorders with onset usually occurring in childhood and adolescence: Secondary | ICD-10-CM | POA: Diagnosis not present

## 2021-12-10 DIAGNOSIS — R251 Tremor, unspecified: Secondary | ICD-10-CM | POA: Diagnosis not present

## 2021-12-10 DIAGNOSIS — R030 Elevated blood-pressure reading, without diagnosis of hypertension: Secondary | ICD-10-CM | POA: Diagnosis not present

## 2021-12-10 DIAGNOSIS — E876 Hypokalemia: Secondary | ICD-10-CM | POA: Diagnosis not present

## 2021-12-10 DIAGNOSIS — E7800 Pure hypercholesterolemia, unspecified: Secondary | ICD-10-CM | POA: Diagnosis not present

## 2021-12-10 DIAGNOSIS — M818 Other osteoporosis without current pathological fracture: Secondary | ICD-10-CM | POA: Diagnosis not present

## 2021-12-10 DIAGNOSIS — M5417 Radiculopathy, lumbosacral region: Secondary | ICD-10-CM | POA: Diagnosis not present

## 2021-12-17 DIAGNOSIS — K219 Gastro-esophageal reflux disease without esophagitis: Secondary | ICD-10-CM | POA: Diagnosis not present

## 2021-12-17 DIAGNOSIS — R5382 Chronic fatigue, unspecified: Secondary | ICD-10-CM | POA: Diagnosis not present

## 2021-12-17 DIAGNOSIS — E7849 Other hyperlipidemia: Secondary | ICD-10-CM | POA: Diagnosis not present

## 2021-12-17 DIAGNOSIS — Z1211 Encounter for screening for malignant neoplasm of colon: Secondary | ICD-10-CM | POA: Diagnosis not present

## 2021-12-17 DIAGNOSIS — G4709 Other insomnia: Secondary | ICD-10-CM | POA: Diagnosis not present

## 2021-12-17 DIAGNOSIS — M159 Polyosteoarthritis, unspecified: Secondary | ICD-10-CM | POA: Diagnosis not present

## 2021-12-17 DIAGNOSIS — K2289 Other specified disease of esophagus: Secondary | ICD-10-CM | POA: Diagnosis not present

## 2021-12-17 DIAGNOSIS — E242 Drug-induced Cushing's syndrome: Secondary | ICD-10-CM | POA: Diagnosis not present

## 2021-12-17 DIAGNOSIS — E876 Hypokalemia: Secondary | ICD-10-CM | POA: Diagnosis not present

## 2021-12-17 DIAGNOSIS — M810 Age-related osteoporosis without current pathological fracture: Secondary | ICD-10-CM | POA: Diagnosis not present

## 2021-12-17 DIAGNOSIS — M545 Low back pain, unspecified: Secondary | ICD-10-CM | POA: Diagnosis not present

## 2021-12-17 DIAGNOSIS — I10 Essential (primary) hypertension: Secondary | ICD-10-CM | POA: Diagnosis not present

## 2021-12-17 DIAGNOSIS — Z1159 Encounter for screening for other viral diseases: Secondary | ICD-10-CM | POA: Diagnosis not present

## 2021-12-17 DIAGNOSIS — R233 Spontaneous ecchymoses: Secondary | ICD-10-CM | POA: Diagnosis not present

## 2021-12-22 ENCOUNTER — Other Ambulatory Visit: Payer: Self-pay

## 2021-12-22 ENCOUNTER — Other Ambulatory Visit: Payer: Self-pay | Admitting: Cardiology

## 2021-12-22 MED ORDER — ROSUVASTATIN CALCIUM 10 MG PO TABS: 10.0000 mg | ORAL_TABLET | Freq: Every day | ORAL | 0 refills | Status: DC

## 2021-12-22 NOTE — Telephone Encounter (Signed)
Rosuvastatin 10 mg # 90 only with message for patient needs appointment for future refills sent to   [place], [place]

## 2022-01-07 DIAGNOSIS — R609 Edema, unspecified: Secondary | ICD-10-CM | POA: Diagnosis not present

## 2022-01-07 DIAGNOSIS — E876 Hypokalemia: Secondary | ICD-10-CM | POA: Diagnosis not present

## 2022-01-07 DIAGNOSIS — R251 Tremor, unspecified: Secondary | ICD-10-CM | POA: Diagnosis not present

## 2022-01-07 DIAGNOSIS — F3341 Major depressive disorder, recurrent, in partial remission: Secondary | ICD-10-CM | POA: Diagnosis not present

## 2022-01-07 DIAGNOSIS — M5417 Radiculopathy, lumbosacral region: Secondary | ICD-10-CM | POA: Diagnosis not present

## 2022-01-07 DIAGNOSIS — M159 Polyosteoarthritis, unspecified: Secondary | ICD-10-CM | POA: Diagnosis not present

## 2022-01-07 DIAGNOSIS — M545 Low back pain, unspecified: Secondary | ICD-10-CM | POA: Diagnosis not present

## 2022-01-07 DIAGNOSIS — F988 Other specified behavioral and emotional disorders with onset usually occurring in childhood and adolescence: Secondary | ICD-10-CM | POA: Diagnosis not present

## 2022-01-07 DIAGNOSIS — R030 Elevated blood-pressure reading, without diagnosis of hypertension: Secondary | ICD-10-CM | POA: Diagnosis not present

## 2022-01-07 DIAGNOSIS — R0609 Other forms of dyspnea: Secondary | ICD-10-CM | POA: Diagnosis not present

## 2022-01-07 DIAGNOSIS — E7800 Pure hypercholesterolemia, unspecified: Secondary | ICD-10-CM | POA: Diagnosis not present

## 2022-01-07 DIAGNOSIS — M818 Other osteoporosis without current pathological fracture: Secondary | ICD-10-CM | POA: Diagnosis not present

## 2022-01-12 ENCOUNTER — Other Ambulatory Visit: Payer: Self-pay

## 2022-01-12 DIAGNOSIS — S81802A Unspecified open wound, left lower leg, initial encounter: Secondary | ICD-10-CM | POA: Diagnosis not present

## 2022-01-12 DIAGNOSIS — I10 Essential (primary) hypertension: Secondary | ICD-10-CM

## 2022-01-12 MED ORDER — ACEBUTOLOL HCL 200 MG PO CAPS: 200.0000 mg | ORAL_CAPSULE | Freq: Two times a day (BID) | ORAL | 0 refills | Status: DC

## 2022-01-15 DIAGNOSIS — Z1211 Encounter for screening for malignant neoplasm of colon: Secondary | ICD-10-CM | POA: Diagnosis not present

## 2022-01-18 DIAGNOSIS — I10 Essential (primary) hypertension: Secondary | ICD-10-CM | POA: Diagnosis not present

## 2022-01-18 DIAGNOSIS — G4700 Insomnia, unspecified: Secondary | ICD-10-CM | POA: Diagnosis not present

## 2022-01-18 DIAGNOSIS — K5900 Constipation, unspecified: Secondary | ICD-10-CM | POA: Diagnosis not present

## 2022-01-18 DIAGNOSIS — F419 Anxiety disorder, unspecified: Secondary | ICD-10-CM | POA: Diagnosis not present

## 2022-01-18 DIAGNOSIS — E242 Drug-induced Cushing's syndrome: Secondary | ICD-10-CM | POA: Diagnosis not present

## 2022-01-18 DIAGNOSIS — K219 Gastro-esophageal reflux disease without esophagitis: Secondary | ICD-10-CM | POA: Diagnosis not present

## 2022-01-18 DIAGNOSIS — M810 Age-related osteoporosis without current pathological fracture: Secondary | ICD-10-CM | POA: Diagnosis not present

## 2022-01-18 DIAGNOSIS — E876 Hypokalemia: Secondary | ICD-10-CM | POA: Diagnosis not present

## 2022-01-18 DIAGNOSIS — R5382 Chronic fatigue, unspecified: Secondary | ICD-10-CM | POA: Diagnosis not present

## 2022-01-18 DIAGNOSIS — E042 Nontoxic multinodular goiter: Secondary | ICD-10-CM | POA: Diagnosis not present

## 2022-01-18 DIAGNOSIS — E2749 Other adrenocortical insufficiency: Secondary | ICD-10-CM | POA: Diagnosis not present

## 2022-01-18 DIAGNOSIS — F341 Dysthymic disorder: Secondary | ICD-10-CM | POA: Diagnosis not present

## 2022-01-25 DIAGNOSIS — M159 Polyosteoarthritis, unspecified: Secondary | ICD-10-CM | POA: Diagnosis not present

## 2022-01-25 DIAGNOSIS — R5383 Other fatigue: Secondary | ICD-10-CM | POA: Diagnosis not present

## 2022-01-25 DIAGNOSIS — R600 Localized edema: Secondary | ICD-10-CM | POA: Diagnosis not present

## 2022-01-25 DIAGNOSIS — E2749 Other adrenocortical insufficiency: Secondary | ICD-10-CM | POA: Diagnosis not present

## 2022-01-25 DIAGNOSIS — I10 Essential (primary) hypertension: Secondary | ICD-10-CM | POA: Diagnosis not present

## 2022-01-25 DIAGNOSIS — G4709 Other insomnia: Secondary | ICD-10-CM | POA: Diagnosis not present

## 2022-01-25 DIAGNOSIS — R0989 Other specified symptoms and signs involving the circulatory and respiratory systems: Secondary | ICD-10-CM | POA: Diagnosis not present

## 2022-01-25 DIAGNOSIS — R197 Diarrhea, unspecified: Secondary | ICD-10-CM | POA: Diagnosis not present

## 2022-01-26 DIAGNOSIS — K219 Gastro-esophageal reflux disease without esophagitis: Secondary | ICD-10-CM | POA: Diagnosis not present

## 2022-01-26 DIAGNOSIS — E785 Hyperlipidemia, unspecified: Secondary | ICD-10-CM | POA: Diagnosis not present

## 2022-02-04 DIAGNOSIS — Z139 Encounter for screening, unspecified: Secondary | ICD-10-CM | POA: Diagnosis not present

## 2022-02-04 DIAGNOSIS — F988 Other specified behavioral and emotional disorders with onset usually occurring in childhood and adolescence: Secondary | ICD-10-CM | POA: Diagnosis not present

## 2022-02-04 DIAGNOSIS — M818 Other osteoporosis without current pathological fracture: Secondary | ICD-10-CM | POA: Diagnosis not present

## 2022-02-04 DIAGNOSIS — M159 Polyosteoarthritis, unspecified: Secondary | ICD-10-CM | POA: Diagnosis not present

## 2022-02-04 DIAGNOSIS — R609 Edema, unspecified: Secondary | ICD-10-CM | POA: Diagnosis not present

## 2022-02-04 DIAGNOSIS — R030 Elevated blood-pressure reading, without diagnosis of hypertension: Secondary | ICD-10-CM | POA: Diagnosis not present

## 2022-02-04 DIAGNOSIS — E876 Hypokalemia: Secondary | ICD-10-CM | POA: Diagnosis not present

## 2022-02-04 DIAGNOSIS — R0609 Other forms of dyspnea: Secondary | ICD-10-CM | POA: Diagnosis not present

## 2022-02-04 DIAGNOSIS — E7800 Pure hypercholesterolemia, unspecified: Secondary | ICD-10-CM | POA: Diagnosis not present

## 2022-02-04 DIAGNOSIS — F3341 Major depressive disorder, recurrent, in partial remission: Secondary | ICD-10-CM | POA: Diagnosis not present

## 2022-02-04 DIAGNOSIS — R251 Tremor, unspecified: Secondary | ICD-10-CM | POA: Diagnosis not present

## 2022-02-04 DIAGNOSIS — M5417 Radiculopathy, lumbosacral region: Secondary | ICD-10-CM | POA: Diagnosis not present

## 2022-02-08 DIAGNOSIS — G4700 Insomnia, unspecified: Secondary | ICD-10-CM | POA: Diagnosis not present

## 2022-02-08 DIAGNOSIS — I10 Essential (primary) hypertension: Secondary | ICD-10-CM | POA: Diagnosis not present

## 2022-02-08 DIAGNOSIS — R5383 Other fatigue: Secondary | ICD-10-CM | POA: Diagnosis not present

## 2022-02-08 DIAGNOSIS — F419 Anxiety disorder, unspecified: Secondary | ICD-10-CM | POA: Diagnosis not present

## 2022-02-08 DIAGNOSIS — E2749 Other adrenocortical insufficiency: Secondary | ICD-10-CM | POA: Diagnosis not present

## 2022-02-08 DIAGNOSIS — F32A Depression, unspecified: Secondary | ICD-10-CM | POA: Diagnosis not present

## 2022-02-08 DIAGNOSIS — E785 Hyperlipidemia, unspecified: Secondary | ICD-10-CM | POA: Diagnosis not present

## 2022-02-08 DIAGNOSIS — L299 Pruritus, unspecified: Secondary | ICD-10-CM | POA: Diagnosis not present

## 2022-02-08 DIAGNOSIS — E042 Nontoxic multinodular goiter: Secondary | ICD-10-CM | POA: Diagnosis not present

## 2022-02-08 DIAGNOSIS — E876 Hypokalemia: Secondary | ICD-10-CM | POA: Diagnosis not present

## 2022-02-08 DIAGNOSIS — K5900 Constipation, unspecified: Secondary | ICD-10-CM | POA: Diagnosis not present

## 2022-02-08 DIAGNOSIS — K219 Gastro-esophageal reflux disease without esophagitis: Secondary | ICD-10-CM | POA: Diagnosis not present

## 2022-02-15 DIAGNOSIS — E242 Drug-induced Cushing's syndrome: Secondary | ICD-10-CM | POA: Diagnosis not present

## 2022-02-15 DIAGNOSIS — E230 Hypopituitarism: Secondary | ICD-10-CM | POA: Diagnosis not present

## 2022-02-15 DIAGNOSIS — E2749 Other adrenocortical insufficiency: Secondary | ICD-10-CM | POA: Diagnosis not present

## 2022-02-15 DIAGNOSIS — E042 Nontoxic multinodular goiter: Secondary | ICD-10-CM | POA: Diagnosis not present

## 2022-02-15 DIAGNOSIS — Z92241 Personal history of systemic steroid therapy: Secondary | ICD-10-CM | POA: Diagnosis not present

## 2022-03-08 DIAGNOSIS — K219 Gastro-esophageal reflux disease without esophagitis: Secondary | ICD-10-CM | POA: Diagnosis not present

## 2022-03-08 DIAGNOSIS — K5900 Constipation, unspecified: Secondary | ICD-10-CM | POA: Diagnosis not present

## 2022-03-08 DIAGNOSIS — I10 Essential (primary) hypertension: Secondary | ICD-10-CM | POA: Diagnosis not present

## 2022-03-08 DIAGNOSIS — M159 Polyosteoarthritis, unspecified: Secondary | ICD-10-CM | POA: Diagnosis not present

## 2022-03-08 DIAGNOSIS — E538 Deficiency of other specified B group vitamins: Secondary | ICD-10-CM | POA: Diagnosis not present

## 2022-03-08 DIAGNOSIS — E559 Vitamin D deficiency, unspecified: Secondary | ICD-10-CM | POA: Diagnosis not present

## 2022-03-08 DIAGNOSIS — M810 Age-related osteoporosis without current pathological fracture: Secondary | ICD-10-CM | POA: Diagnosis not present

## 2022-03-08 DIAGNOSIS — E785 Hyperlipidemia, unspecified: Secondary | ICD-10-CM | POA: Diagnosis not present

## 2022-03-08 DIAGNOSIS — F419 Anxiety disorder, unspecified: Secondary | ICD-10-CM | POA: Diagnosis not present

## 2022-03-08 DIAGNOSIS — F32A Depression, unspecified: Secondary | ICD-10-CM | POA: Diagnosis not present

## 2022-03-08 DIAGNOSIS — R5382 Chronic fatigue, unspecified: Secondary | ICD-10-CM | POA: Diagnosis not present

## 2022-03-08 DIAGNOSIS — E2749 Other adrenocortical insufficiency: Secondary | ICD-10-CM | POA: Diagnosis not present

## 2022-04-16 DIAGNOSIS — I10 Essential (primary) hypertension: Secondary | ICD-10-CM | POA: Diagnosis not present

## 2022-04-16 DIAGNOSIS — R5382 Chronic fatigue, unspecified: Secondary | ICD-10-CM | POA: Diagnosis not present

## 2022-04-25 DIAGNOSIS — K219 Gastro-esophageal reflux disease without esophagitis: Secondary | ICD-10-CM | POA: Diagnosis not present

## 2022-04-25 DIAGNOSIS — I10 Essential (primary) hypertension: Secondary | ICD-10-CM | POA: Diagnosis not present

## 2022-04-27 DIAGNOSIS — M818 Other osteoporosis without current pathological fracture: Secondary | ICD-10-CM | POA: Diagnosis not present

## 2022-05-04 DIAGNOSIS — F3341 Major depressive disorder, recurrent, in partial remission: Secondary | ICD-10-CM | POA: Diagnosis not present

## 2022-05-04 DIAGNOSIS — E7800 Pure hypercholesterolemia, unspecified: Secondary | ICD-10-CM | POA: Diagnosis not present

## 2022-05-04 DIAGNOSIS — R030 Elevated blood-pressure reading, without diagnosis of hypertension: Secondary | ICD-10-CM | POA: Diagnosis not present

## 2022-05-04 DIAGNOSIS — R609 Edema, unspecified: Secondary | ICD-10-CM | POA: Diagnosis not present

## 2022-05-04 DIAGNOSIS — F988 Other specified behavioral and emotional disorders with onset usually occurring in childhood and adolescence: Secondary | ICD-10-CM | POA: Diagnosis not present

## 2022-05-04 DIAGNOSIS — M545 Low back pain, unspecified: Secondary | ICD-10-CM | POA: Diagnosis not present

## 2022-05-04 DIAGNOSIS — R251 Tremor, unspecified: Secondary | ICD-10-CM | POA: Diagnosis not present

## 2022-05-04 DIAGNOSIS — M159 Polyosteoarthritis, unspecified: Secondary | ICD-10-CM | POA: Diagnosis not present

## 2022-05-04 DIAGNOSIS — R0609 Other forms of dyspnea: Secondary | ICD-10-CM | POA: Diagnosis not present

## 2022-05-04 DIAGNOSIS — M818 Other osteoporosis without current pathological fracture: Secondary | ICD-10-CM | POA: Diagnosis not present

## 2022-05-04 DIAGNOSIS — E876 Hypokalemia: Secondary | ICD-10-CM | POA: Diagnosis not present

## 2022-05-04 DIAGNOSIS — M5417 Radiculopathy, lumbosacral region: Secondary | ICD-10-CM | POA: Diagnosis not present

## 2022-05-06 DIAGNOSIS — R0781 Pleurodynia: Secondary | ICD-10-CM | POA: Diagnosis not present

## 2022-05-06 DIAGNOSIS — I771 Stricture of artery: Secondary | ICD-10-CM | POA: Diagnosis not present

## 2022-05-11 DIAGNOSIS — L578 Other skin changes due to chronic exposure to nonionizing radiation: Secondary | ICD-10-CM | POA: Diagnosis not present

## 2022-05-11 DIAGNOSIS — L820 Inflamed seborrheic keratosis: Secondary | ICD-10-CM | POA: Diagnosis not present

## 2022-05-11 DIAGNOSIS — D225 Melanocytic nevi of trunk: Secondary | ICD-10-CM | POA: Diagnosis not present

## 2022-05-11 DIAGNOSIS — L814 Other melanin hyperpigmentation: Secondary | ICD-10-CM | POA: Diagnosis not present

## 2022-06-03 DIAGNOSIS — R251 Tremor, unspecified: Secondary | ICD-10-CM | POA: Diagnosis not present

## 2022-06-03 DIAGNOSIS — R609 Edema, unspecified: Secondary | ICD-10-CM | POA: Diagnosis not present

## 2022-06-03 DIAGNOSIS — M818 Other osteoporosis without current pathological fracture: Secondary | ICD-10-CM | POA: Diagnosis not present

## 2022-06-03 DIAGNOSIS — E7800 Pure hypercholesterolemia, unspecified: Secondary | ICD-10-CM | POA: Diagnosis not present

## 2022-06-03 DIAGNOSIS — M5417 Radiculopathy, lumbosacral region: Secondary | ICD-10-CM | POA: Diagnosis not present

## 2022-06-03 DIAGNOSIS — R030 Elevated blood-pressure reading, without diagnosis of hypertension: Secondary | ICD-10-CM | POA: Diagnosis not present

## 2022-06-03 DIAGNOSIS — F988 Other specified behavioral and emotional disorders with onset usually occurring in childhood and adolescence: Secondary | ICD-10-CM | POA: Diagnosis not present

## 2022-06-03 DIAGNOSIS — E876 Hypokalemia: Secondary | ICD-10-CM | POA: Diagnosis not present

## 2022-06-03 DIAGNOSIS — Z6826 Body mass index (BMI) 26.0-26.9, adult: Secondary | ICD-10-CM | POA: Diagnosis not present

## 2022-06-03 DIAGNOSIS — F3341 Major depressive disorder, recurrent, in partial remission: Secondary | ICD-10-CM | POA: Diagnosis not present

## 2022-06-03 DIAGNOSIS — M159 Polyosteoarthritis, unspecified: Secondary | ICD-10-CM | POA: Diagnosis not present

## 2022-06-15 DIAGNOSIS — E2749 Other adrenocortical insufficiency: Secondary | ICD-10-CM | POA: Diagnosis not present

## 2022-06-15 DIAGNOSIS — G4700 Insomnia, unspecified: Secondary | ICD-10-CM | POA: Diagnosis not present

## 2022-06-15 DIAGNOSIS — R4586 Emotional lability: Secondary | ICD-10-CM | POA: Diagnosis not present

## 2022-06-17 DIAGNOSIS — I10 Essential (primary) hypertension: Secondary | ICD-10-CM | POA: Diagnosis not present

## 2022-06-17 DIAGNOSIS — M5417 Radiculopathy, lumbosacral region: Secondary | ICD-10-CM | POA: Diagnosis not present

## 2022-06-17 DIAGNOSIS — M818 Other osteoporosis without current pathological fracture: Secondary | ICD-10-CM | POA: Diagnosis not present

## 2022-06-17 DIAGNOSIS — M159 Polyosteoarthritis, unspecified: Secondary | ICD-10-CM | POA: Diagnosis not present

## 2022-06-17 DIAGNOSIS — R251 Tremor, unspecified: Secondary | ICD-10-CM | POA: Diagnosis not present

## 2022-06-17 DIAGNOSIS — F3341 Major depressive disorder, recurrent, in partial remission: Secondary | ICD-10-CM | POA: Diagnosis not present

## 2022-06-17 DIAGNOSIS — E876 Hypokalemia: Secondary | ICD-10-CM | POA: Diagnosis not present

## 2022-06-17 DIAGNOSIS — R609 Edema, unspecified: Secondary | ICD-10-CM | POA: Diagnosis not present

## 2022-06-17 DIAGNOSIS — E7800 Pure hypercholesterolemia, unspecified: Secondary | ICD-10-CM | POA: Diagnosis not present

## 2022-06-17 DIAGNOSIS — F988 Other specified behavioral and emotional disorders with onset usually occurring in childhood and adolescence: Secondary | ICD-10-CM | POA: Diagnosis not present

## 2022-06-17 DIAGNOSIS — J069 Acute upper respiratory infection, unspecified: Secondary | ICD-10-CM | POA: Diagnosis not present

## 2022-06-17 DIAGNOSIS — L309 Dermatitis, unspecified: Secondary | ICD-10-CM | POA: Diagnosis not present

## 2022-06-22 DIAGNOSIS — R5382 Chronic fatigue, unspecified: Secondary | ICD-10-CM | POA: Diagnosis not present

## 2022-07-09 DIAGNOSIS — R0683 Snoring: Secondary | ICD-10-CM | POA: Diagnosis not present

## 2022-07-09 DIAGNOSIS — R5383 Other fatigue: Secondary | ICD-10-CM | POA: Diagnosis not present

## 2022-08-03 DIAGNOSIS — Z6826 Body mass index (BMI) 26.0-26.9, adult: Secondary | ICD-10-CM | POA: Diagnosis not present

## 2022-08-03 DIAGNOSIS — M5417 Radiculopathy, lumbosacral region: Secondary | ICD-10-CM | POA: Diagnosis not present

## 2022-08-03 DIAGNOSIS — F988 Other specified behavioral and emotional disorders with onset usually occurring in childhood and adolescence: Secondary | ICD-10-CM | POA: Diagnosis not present

## 2022-08-03 DIAGNOSIS — R609 Edema, unspecified: Secondary | ICD-10-CM | POA: Diagnosis not present

## 2022-08-03 DIAGNOSIS — M818 Other osteoporosis without current pathological fracture: Secondary | ICD-10-CM | POA: Diagnosis not present

## 2022-08-03 DIAGNOSIS — F3341 Major depressive disorder, recurrent, in partial remission: Secondary | ICD-10-CM | POA: Diagnosis not present

## 2022-08-03 DIAGNOSIS — E7800 Pure hypercholesterolemia, unspecified: Secondary | ICD-10-CM | POA: Diagnosis not present

## 2022-08-03 DIAGNOSIS — M159 Polyosteoarthritis, unspecified: Secondary | ICD-10-CM | POA: Diagnosis not present

## 2022-08-03 DIAGNOSIS — E876 Hypokalemia: Secondary | ICD-10-CM | POA: Diagnosis not present

## 2022-08-03 DIAGNOSIS — G4733 Obstructive sleep apnea (adult) (pediatric): Secondary | ICD-10-CM | POA: Diagnosis not present

## 2022-08-03 DIAGNOSIS — I10 Essential (primary) hypertension: Secondary | ICD-10-CM | POA: Diagnosis not present

## 2022-08-06 DIAGNOSIS — M5417 Radiculopathy, lumbosacral region: Secondary | ICD-10-CM | POA: Diagnosis not present

## 2022-08-06 DIAGNOSIS — R0683 Snoring: Secondary | ICD-10-CM | POA: Diagnosis not present

## 2022-08-06 DIAGNOSIS — F988 Other specified behavioral and emotional disorders with onset usually occurring in childhood and adolescence: Secondary | ICD-10-CM | POA: Diagnosis not present

## 2022-08-06 DIAGNOSIS — G4733 Obstructive sleep apnea (adult) (pediatric): Secondary | ICD-10-CM | POA: Diagnosis not present

## 2022-08-06 DIAGNOSIS — E7800 Pure hypercholesterolemia, unspecified: Secondary | ICD-10-CM | POA: Diagnosis not present

## 2022-08-06 DIAGNOSIS — G2581 Restless legs syndrome: Secondary | ICD-10-CM | POA: Diagnosis not present

## 2022-08-06 DIAGNOSIS — M818 Other osteoporosis without current pathological fracture: Secondary | ICD-10-CM | POA: Diagnosis not present

## 2022-08-06 DIAGNOSIS — E876 Hypokalemia: Secondary | ICD-10-CM | POA: Diagnosis not present

## 2022-08-06 DIAGNOSIS — R5383 Other fatigue: Secondary | ICD-10-CM | POA: Diagnosis not present

## 2022-08-06 DIAGNOSIS — I10 Essential (primary) hypertension: Secondary | ICD-10-CM | POA: Diagnosis not present

## 2022-08-06 DIAGNOSIS — M159 Polyosteoarthritis, unspecified: Secondary | ICD-10-CM | POA: Diagnosis not present

## 2022-08-06 DIAGNOSIS — R609 Edema, unspecified: Secondary | ICD-10-CM | POA: Diagnosis not present

## 2022-08-06 DIAGNOSIS — J309 Allergic rhinitis, unspecified: Secondary | ICD-10-CM | POA: Diagnosis not present

## 2022-08-06 DIAGNOSIS — F3341 Major depressive disorder, recurrent, in partial remission: Secondary | ICD-10-CM | POA: Diagnosis not present

## 2022-08-17 ENCOUNTER — Encounter: Payer: Self-pay | Admitting: Neurology

## 2022-08-17 ENCOUNTER — Ambulatory Visit (INDEPENDENT_AMBULATORY_CARE_PROVIDER_SITE_OTHER): Payer: Medicare Other | Admitting: Neurology

## 2022-08-17 DIAGNOSIS — R0683 Snoring: Secondary | ICD-10-CM | POA: Diagnosis not present

## 2022-08-17 DIAGNOSIS — G5690 Unspecified mononeuropathy of unspecified upper limb: Secondary | ICD-10-CM

## 2022-08-17 DIAGNOSIS — M5442 Lumbago with sciatica, left side: Secondary | ICD-10-CM | POA: Diagnosis not present

## 2022-08-17 DIAGNOSIS — G2581 Restless legs syndrome: Secondary | ICD-10-CM

## 2022-08-17 DIAGNOSIS — R5382 Chronic fatigue, unspecified: Secondary | ICD-10-CM | POA: Diagnosis not present

## 2022-08-17 DIAGNOSIS — M791 Myalgia, unspecified site: Secondary | ICD-10-CM

## 2022-08-17 DIAGNOSIS — G8929 Other chronic pain: Secondary | ICD-10-CM

## 2022-08-17 DIAGNOSIS — E7801 Familial hypercholesterolemia: Secondary | ICD-10-CM

## 2022-08-17 DIAGNOSIS — E2749 Other adrenocortical insufficiency: Secondary | ICD-10-CM

## 2022-08-17 DIAGNOSIS — R946 Abnormal results of thyroid function studies: Secondary | ICD-10-CM | POA: Diagnosis not present

## 2022-08-17 DIAGNOSIS — G4719 Other hypersomnia: Secondary | ICD-10-CM | POA: Diagnosis not present

## 2022-08-17 DIAGNOSIS — M5441 Lumbago with sciatica, right side: Secondary | ICD-10-CM

## 2022-08-17 DIAGNOSIS — G4701 Insomnia due to medical condition: Secondary | ICD-10-CM

## 2022-08-17 HISTORY — DX: Restless legs syndrome: G25.81

## 2022-08-17 NOTE — Progress Notes (Signed)
SLEEP MEDICINE CLINIC    Provider:  Larey Seat, MD  Primary Care Physician:  Cher Nakai, MD [place] [id number]     Referring Provider: Cher Nakai, Md [place],  [place] [address]     Primary neurologist : Dr Krista Blue.        Chief Complaint according to patient   Patient presents with:     New Patient (Initial Visit)           HISTORY OF PRESENT ILLNESS:  Meghan Tucker is a 76 y.o. female patient seen upon referral on 08/17/2022 from Dr Truman Hayward.  Chief concern according to patient :  " I have trouble sleeping partially due to back pain, finally diagnosed  with adrenal insufficiency and gained 18 pounds under the treatment".  I ache all over - sleepiness all and fatigue most days. My pain is a deep burning , radiating into hands and fingers, stiffness in AM",  Snoring was witnessed, but not apnea. 7 years ago she did a negative HST.     I have the pleasure of seeing Malyiah Fellows today. She  has a past medical history of Adrenal insufficiency ([place]), Arthritis, Chronic back pain, Diverticulosis, Essential hypertension, GERD (gastroesophageal reflux disease), History of hiatal hernia, History of kidney stones, History of kidney stones, History of migraine- ended with menopause, Hyperlipidemia, Hypertension, Insomnia with chronic pain, myalgia.  Thyroid disease, Tingling, and Tremor.   Sleep relevant medical history: sinusitis, congestion, deviated septum , phlegm, coughing.     Family medical /sleep history: No other family member on CPAP with OSA, insomnia, sleep walkers.    Social history:  Patient is married, she sleeps alone - retired from Printmaker / homemaker- and lives in a household with home- Family status is married , with 2 adult sons,  grandchildren.   Tobacco use: none .  ETOH use: Champagne  1 glass with dinner,  Caffeine intake :she drinks RED BULL (!)  energy drinks. Regular exercise in form of not anymore.    Hobbies :fund raising.    Sleep habits are as follows: The patient's dinner time is between 7-8 PM. The patient goes to bed at 10 PM and takes a sleep aid, continues to sleep from 12- 9 AM , 9 hours, wakes rarely for bathroom breaks. She freezes in bed, but by 4 AM she feels hot,  wakes in a pool of sweat. Reports palpitations.  The preferred sleep position is laterally or prone, with the support of 1 pillow.  Dreams are reportedly crazy- frequent/vivid.  8-9  AM is the usual rise time. The patient wakes up spontaneously but not easily.  She reports not feeling refreshed or restored in AM, with symptoms such as dry mouth, stiffness, pain, sometimes  morning headaches, and residual fatigue.  Naps are taken daily, lasting from 60-90  minutes and are no more  refreshing than nocturnal sleep.    Review of Systems: Out of a complete 14 system review, the patient complains of only the following symptoms, and all other reviewed systems are negative.:  Fatigue, sleepiness , snoring, fragmented sleep, lethargic, pain and  aches.  ROS : RLS new onset.     How likely are you to doze in the following situations: 0 = not likely, 1 = slight chance, 2 = moderate chance, 3 = high chance   Sitting and Reading? Watching Television? Sitting inactive in a public place (theater or  meeting)? As a passenger in a car for an hour without a break? Lying down in the afternoon when circumstances permit? Sitting and talking to someone? Sitting quietly after lunch without alcohol? In a car, while stopped for a few minutes in traffic?   Total = 12/ 24 points   FSS endorsed at 63/ 63 points.   Retired Metallurgist.    Social History   Socioeconomic History   Marital status: Married    Spouse name: [name]   Number of children: 2   Years of education: two years of college   Highest education level: Not on file  Occupational History   Occupation: Retired  Tobacco Use   Smoking status: Never   Smokeless  tobacco: Never  Vaping Use   Vaping Use: Never used  Substance and Sexual Activity   Alcohol use: Yes    Comment: 1-2 glasses champagne nightly   Drug use: Never   Sexual activity: Not on file  Other Topics Concern   Not on file  Social History Narrative   Lives at home with her husband.   Left-handed.   Small can of Red Bull daily (does not always finish).   Social Determinants of Health   Financial Resource Strain: Not on file  Food Insecurity: Not on file  Transportation Needs: Not on file  Physical Activity: Not on file  Stress: Not on file  Social Connections: Not on file    Family History  Problem Relation Age of Onset   Heart disease Father    Heart attack Father 63   Stroke Mother 70   Colon cancer Neg Hx    Esophageal cancer Neg Hx    Cancer Neg Hx     Past Medical History:  Diagnosis Date   Adrenal insufficiency ([place])    Arthritis    "all over"   Chronic back pain    2 buldging disc,stenosis   Diverticulosis    Essential hypertension    takes Metoprolol daily   GERD (gastroesophageal reflux disease)    takes Omeprazole daily   History of hiatal hernia    History of kidney stones    History of kidney stones    History of migraine    none since menopause yrs ago   Hyperlipidemia    takes Fish Oil daily   Hypertension    takes Metoprolol daily   Insomnia    takes Ambien nightly   Sciatica    Sciatica    Thyroid disease    Tingling    right arm.Both feet    Tremor     Past Surgical History:  Procedure Laterality Date   CARPAL TUNNEL RELEASE Left    cataract surgery Bilateral    COLONOSCOPY     Dr. Jerilynn Mages in [place]   ESOPHAGOGASTRODUODENOSCOPY     Dr Jerilynn Mages in [place]   ESOPHAGOGASTRODUODENOSCOPY (EGD) WITH ESOPHAGEAL DILATION  05/14/2015   with Propofol   ESOPHAGOGASTRODUODENOSCOPY (EGD) WITH PROPOFOL  10/19/2013   GANGLION CYST EXCISION     HAMMERTOE RECONSTRUCTION WITH WEIL OSTEOTOMY Left 07/17/2015   Procedure: LEFT 2-3 WEIL OSTEOTOMY AND  HAMMERTOE CORRECTION ;  Surgeon: [name], MD;  Location: [facility];  Service: Orthopedics;  Laterality: Left;     Current Outpatient Medications on File Prior to Visit  Medication Sig Dispense Refill   acebutolol (SECTRAL) 200 MG capsule Take 1 capsule (200 mg total) by mouth 2 (two) times daily. Need to request future refills from primary care doctor or schedule  appointment for future refills 60 capsule 0   Apoaequorin (PREVAGEN PO) Take 1 tablet by mouth daily. Prevagen     buPROPion (WELLBUTRIN XL) 150 MG 24 hr tablet Take 150 mg by mouth every morning.     CALCIUM CITRATE PO Take 1 tablet by mouth every other day.     dicyclomine (BENTYL) 10 MG capsule Take 1 capsule (10 mg total) by mouth 2 (two) times daily. 60 capsule 3   DULoxetine (CYMBALTA) 20 MG capsule Take 1 capsule (20 mg total) by mouth daily. 30 capsule 6   DULoxetine (CYMBALTA) 60 MG capsule Take 1 capsule (60 mg total) by mouth daily. 30 capsule 6   ezetimibe (ZETIA) 10 MG tablet Take 10 mg by mouth daily.     FIBER PO Take 1 tablet by mouth daily.     folic acid (FOLVITE) 1 MG tablet Take 1 mg by mouth daily.     furosemide (LASIX) 20 MG tablet Take 10 mg by mouth daily.     hydrocortisone (CORTEF) 10 MG tablet Take 10 mg by mouth daily.     hydrOXYzine (ATARAX/VISTARIL) 10 MG tablet Take 10 mg by mouth 2 (two) times daily.     METAMUCIL FIBER PO Take 2 tablets by mouth daily.     pantoprazole (PROTONIX) 40 MG tablet Take 1 tablet (40 mg total) by mouth daily. 30 tablet 11   Probiotic Product (ALIGN PO) Take by mouth.     Psyllium (METAMUCIL PO) Take by mouth every morning. 3 in 1 fiber     rosuvastatin (CRESTOR) 10 MG tablet Take 1 tablet (10 mg total) by mouth daily. Need appointment for future refills 90 tablet 0   traZODone (DESYREL) 50 MG tablet Take 50 mg by mouth at bedtime.     zolpidem (AMBIEN CR) 12.5 MG CR tablet Take 12.5 mg by mouth at bedtime.     No current facility-administered  medications on file prior to visit.    Allergies  Allergen Reactions   Atorvastatin Other (See Comments)    MYALGIAS MYALGIAS Other reaction(s): Myalgias (intolerance), Other, Other (See Comments) MYALGIAS MYALGIAS MYALGIAS   Rosuvastatin Other (See Comments)    MYALGIAS MYALGIAS Other reaction(s): Myalgias (intolerance), Other, Other (See Comments) MYALGIAS MYALGIAS MYALGIAS    Physical exam:  There were no vitals filed for this visit. There is no height or weight on file to calculate BMI.   Wt Readings from Last 3 Encounters:  10/02/21 133 lb 14.4 oz (60.7 kg)  04/17/21 133 lb (60.3 kg)  02/26/21 128 lb 6.4 oz (58.2 kg)     Ht Readings from Last 3 Encounters:  10/02/21 '5\' 5"'$  (1.651 m)  04/17/21 '5\' 5"'$  (1.651 m)  02/26/21 '5\' 5"'$  (1.651 m)      General: The patient is awake, alert and appears not in acute distress. The patient is well groomed. Head: Normocephalic, atraumatic. Neck is supple.  Mallampati 2 ,  neck circumference:15.5 inches . Nasal airflow  patent.  Retrognathia is not seen.  Dental status: biological  Cardiovascular:  Regular rate and cardiac rhythm by pulse,  without distended neck veins. Respiratory: Lungs are clear to auscultation.  Skin:  Without evidence of ankle edema, or rash. Trunk: The patient's posture is erect.   Neurologic exam : The patient is awake and alert, oriented to place and time.   Memory subjective described as intact.  Attention span & concentration ability appears normal.  Speech is fluent,  without dysarthria, dysphonia or aphasia.  Mood and  affect are appropriate.   Cranial nerves: no loss of smell or taste reported  Pupils are equal and briskly reactive to light. Funduscopic exam deferred.  Extraocular movements in vertical and horizontal planes were intact and without nystagmus. No Diplopia. Visual fields by finger perimetry are intact. Hearing was intact to soft voice and finger rubbing.    Facial sensation intact  to fine touch.  Facial motor strength is symmetric and tongue and uvula move midline.  Neck ROM : rotation, tilt and flexion extension were normal for age and shoulder shrug was symmetrical.    Motor exam:  Symmetric bulk, tone and ROM.   Normal tone without cog -wheeling, symmetric grip strength - there is slightly higher resistance in the left shoulder and biceps.   Sensory:  Fine touch and vibration were tested  and  normal.  Left hand feel numb in the mornings, there is a burning pain radiating from the shoulder into the hands- like a burning rod.  Proprioception tested in the upper extremities was normal.   Coordination: Rapid alternating movements in the fingers/hands were of normal speed.  The Finger-to-nose maneuver was intact without evidence of ataxia, dysmetria or tremor.   Gait and station: Patient could rise unassisted from a seated position, walked without assistive device.  Stance is of normal width/ base and the patient turned with 3 steps.  Toe and heel walk were deferred.  Deep tendon reflexes: in the  upper and lower extremities are symmetric and intact.  Babinski response was deferred.        After spending a total time of  45  minutes face to face and additional time for physical and neurologic examination, review of laboratory studies,  personal review of imaging studies, reports and results of other testing and review of referral information / records as far as provided in visit, I have established the following assessments:    1) chronic pain, myalgia, deep radicular pain radiating from the shoulders especially the left into the hand, history of chronic back pain which has improved after back surgery-she developed pain radiating from the lumbar spine into the hamstrings and gluteus towards the lateral thigh.  2) It seems to have been a constant inflammatory process that may have affected her sleep, her level of fatigue, her daytime excessive sleepiness in order to  get sleep she is taking sleep aids allowing her to have 8 or 9 hours of nocturnal sleep with minimal interruption and yet with no restorative refreshing quality.   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 Since then, still dealing with persistent fatigue. She had features of iatrogenic cushings on exam. We have reduced her total daily dose from '35mg'$  to '20mg'$ . Her fatigue is stable. No longer gaining weight.  4) snoring and dry mouth - OSA screening. Twin brother has OSA and is using CPAP>   08/17/22   My Plan is to proceed with:  1) I like to obtain a neuropathy- panel, CKMB,   2) and a sleep study to screen for OSA..    I would like to thank Cher Nakai, MD and Cher Nakai, Md [place],  [place] [address] for allowing me to meet with and to take care of this pleasant patient.    I plan to follow up either personally or through our NP within 3-4 months.   CC: I will share my notes with .  Electronically signed by: Larey Seat, MD 08/17/2022 3:33 PM  [facility] and [facility] Sleep Board certified by The [employer] of Sleep Medicine and Diplomate of the [employer] of Sleep Medicine. Board certified In Neurology through the [place], Fellow of the [employer] of Neurology. Medical Director of [employer].

## 2022-08-19 DIAGNOSIS — M47816 Spondylosis without myelopathy or radiculopathy, lumbar region: Secondary | ICD-10-CM | POA: Diagnosis not present

## 2022-08-19 DIAGNOSIS — M791 Myalgia, unspecified site: Secondary | ICD-10-CM | POA: Diagnosis not present

## 2022-08-19 DIAGNOSIS — R197 Diarrhea, unspecified: Secondary | ICD-10-CM | POA: Diagnosis not present

## 2022-08-19 DIAGNOSIS — Z7952 Long term (current) use of systemic steroids: Secondary | ICD-10-CM | POA: Diagnosis not present

## 2022-08-19 DIAGNOSIS — K30 Functional dyspepsia: Secondary | ICD-10-CM | POA: Diagnosis not present

## 2022-08-19 DIAGNOSIS — G4700 Insomnia, unspecified: Secondary | ICD-10-CM | POA: Diagnosis not present

## 2022-08-19 DIAGNOSIS — Z79899 Other long term (current) drug therapy: Secondary | ICD-10-CM | POA: Diagnosis not present

## 2022-08-19 DIAGNOSIS — Z9189 Other specified personal risk factors, not elsewhere classified: Secondary | ICD-10-CM | POA: Diagnosis not present

## 2022-08-19 DIAGNOSIS — E278 Other specified disorders of adrenal gland: Secondary | ICD-10-CM | POA: Diagnosis not present

## 2022-08-19 DIAGNOSIS — R29898 Other symptoms and signs involving the musculoskeletal system: Secondary | ICD-10-CM | POA: Diagnosis not present

## 2022-08-19 DIAGNOSIS — R5382 Chronic fatigue, unspecified: Secondary | ICD-10-CM | POA: Diagnosis not present

## 2022-08-19 DIAGNOSIS — E2749 Other adrenocortical insufficiency: Secondary | ICD-10-CM | POA: Diagnosis not present

## 2022-08-19 DIAGNOSIS — G2581 Restless legs syndrome: Secondary | ICD-10-CM | POA: Diagnosis not present

## 2022-08-19 DIAGNOSIS — K219 Gastro-esophageal reflux disease without esophagitis: Secondary | ICD-10-CM | POA: Diagnosis not present

## 2022-08-19 DIAGNOSIS — Z79620 Long term (current) use of immunosuppressive biologic: Secondary | ICD-10-CM | POA: Diagnosis not present

## 2022-08-19 DIAGNOSIS — M810 Age-related osteoporosis without current pathological fracture: Secondary | ICD-10-CM | POA: Diagnosis not present

## 2022-08-19 DIAGNOSIS — E785 Hyperlipidemia, unspecified: Secondary | ICD-10-CM | POA: Diagnosis not present

## 2022-08-23 DIAGNOSIS — R4586 Emotional lability: Secondary | ICD-10-CM | POA: Diagnosis not present

## 2022-08-23 DIAGNOSIS — R5382 Chronic fatigue, unspecified: Secondary | ICD-10-CM | POA: Diagnosis not present

## 2022-08-23 DIAGNOSIS — M810 Age-related osteoporosis without current pathological fracture: Secondary | ICD-10-CM | POA: Diagnosis not present

## 2022-08-23 DIAGNOSIS — I10 Essential (primary) hypertension: Secondary | ICD-10-CM | POA: Diagnosis not present

## 2022-08-23 DIAGNOSIS — E2749 Other adrenocortical insufficiency: Secondary | ICD-10-CM | POA: Diagnosis not present

## 2022-08-23 DIAGNOSIS — G4700 Insomnia, unspecified: Secondary | ICD-10-CM | POA: Diagnosis not present

## 2022-08-23 LAB — SJOGREN'S SYNDROME ANTIBODS(SSA + SSB)
ENA SSA (RO) Ab: <0.2 AI (ref 0.0–0.9)
ENA SSB (LA) Ab: <0.2 AI (ref 0.0–0.9)

## 2022-08-23 LAB — C-REACTIVE PROTEIN: CRP: 2 mg/L (ref 0–10)

## 2022-08-23 LAB — CBC WITH DIFFERENTIAL/PLATELET
Basophils Absolute: 0.1 10*3/uL (ref 0.0–0.2)
Basos: 1 %
EOS (ABSOLUTE): 0.1 10*3/uL (ref 0.0–0.4)
Eos: 2 %
Hematocrit: 40.5 % (ref 34.0–46.6)
Hemoglobin: 13.6 g/dL (ref 11.1–15.9)
Immature Grans (Abs): 0 10*3/uL (ref 0.0–0.1)
Immature Granulocytes: 0 %
Lymphocytes Absolute: 2.1 10*3/uL (ref 0.7–3.1)
Lymphs: 29 %
MCH: 29.1 pg (ref 26.6–33.0)
MCHC: 33.6 g/dL (ref 31.5–35.7)
MCV: 87 fL (ref 79–97)
Monocytes Absolute: 0.6 10*3/uL (ref 0.1–0.9)
Monocytes: 9 %
Neutrophils Absolute: 4.2 10*3/uL (ref 1.4–7.0)
Neutrophils: 59 %
Platelets: 313 10*3/uL (ref 150–450)
RBC: 4.67 x10E6/uL (ref 3.77–5.28)
RDW: 14.4 % (ref 11.7–15.4)
WBC: 7.1 10*3/uL (ref 3.4–10.8)

## 2022-08-23 LAB — MULTIPLE MYELOMA PANEL, SERUM
Albumin SerPl Elph-Mcnc: 3.9 g/dL (ref 2.9–4.4)
Albumin/Glob SerPl: 1.5 (ref 0.7–1.7)
Alpha 1: 0.2 g/dL (ref 0.0–0.4)
Alpha2 Glob SerPl Elph-Mcnc: 0.7 g/dL (ref 0.4–1.0)
B-Globulin SerPl Elph-Mcnc: 1.1 g/dL (ref 0.7–1.3)
Gamma Glob SerPl Elph-Mcnc: 0.7 g/dL (ref 0.4–1.8)
Globulin, Total: 2.7 g/dL (ref 2.2–3.9)
IgA/Immunoglobulin A, Serum: 195 mg/dL (ref 64–422)
IgG (Immunoglobin G), Serum: 741 mg/dL (ref 586–1602)
IgM (Immunoglobulin M), Srm: 103 mg/dL (ref 26–217)

## 2022-08-23 LAB — COMPREHENSIVE METABOLIC PANEL
ALT: 24 IU/L (ref 0–32)
AST: 28 IU/L (ref 0–40)
Albumin/Globulin Ratio: 1.8 (ref 1.2–2.2)
Albumin: 4.2 g/dL (ref 3.8–4.8)
Alkaline Phosphatase: 63 IU/L (ref 44–121)
BUN/Creatinine Ratio: 15 (ref 12–28)
BUN: 17 mg/dL (ref 8–27)
Bilirubin Total: 0.4 mg/dL (ref 0.0–1.2)
CO2: 25 mmol/L (ref 20–29)
Calcium: 9.8 mg/dL (ref 8.7–10.3)
Chloride: 102 mmol/L (ref 96–106)
Creatinine, Ser: 1.16 mg/dL — ABNORMAL HIGH (ref 0.57–1.00)
Globulin, Total: 2.4 g/dL (ref 1.5–4.5)
Glucose: 88 mg/dL (ref 70–99)
Potassium: 5 mmol/L (ref 3.5–5.2)
Sodium: 141 mmol/L (ref 134–144)
Total Protein: 6.6 g/dL (ref 6.0–8.5)
eGFR: 49 mL/min/{1.73_m2} — ABNORMAL LOW (ref >59)

## 2022-08-23 LAB — CK TOTAL AND CKMB (NOT AT ARMC)
CK-MB Index: 2.3 ng/mL (ref 0.0–5.3)
Total CK: 77 U/L (ref 32–182)

## 2022-08-23 LAB — HOMOCYSTEINE: Homocysteine: 15 umol/L (ref 0.0–19.2)

## 2022-08-23 LAB — SEDIMENTATION RATE: Sed Rate: 3 mm/hr (ref 0–40)

## 2022-08-23 LAB — ANA W/REFLEX: ANA Titer 1: NEGATIVE

## 2022-08-23 LAB — METHYLMALONIC ACID, SERUM: Methylmalonic Acid: 313 nmol/L (ref 0–378)

## 2022-08-24 ENCOUNTER — Telehealth: Payer: Self-pay | Admitting: Neurology

## 2022-08-24 ENCOUNTER — Encounter: Payer: Self-pay | Admitting: Neurology

## 2022-08-24 NOTE — Telephone Encounter (Signed)
NPSG- Medicare/[employer] supp no auth req.  Patient is scheduled at [facility] for 09/13/22 at 9 pm.  Mailed packet to the patient.

## 2022-09-03 DIAGNOSIS — R5382 Chronic fatigue, unspecified: Secondary | ICD-10-CM | POA: Diagnosis not present

## 2022-09-03 DIAGNOSIS — E559 Vitamin D deficiency, unspecified: Secondary | ICD-10-CM | POA: Diagnosis not present

## 2022-09-03 DIAGNOSIS — I10 Essential (primary) hypertension: Secondary | ICD-10-CM | POA: Diagnosis not present

## 2022-09-03 DIAGNOSIS — E0590 Thyrotoxicosis, unspecified without thyrotoxic crisis or storm: Secondary | ICD-10-CM | POA: Diagnosis not present

## 2022-09-10 DIAGNOSIS — R5383 Other fatigue: Secondary | ICD-10-CM | POA: Diagnosis not present

## 2022-09-13 ENCOUNTER — Ambulatory Visit (INDEPENDENT_AMBULATORY_CARE_PROVIDER_SITE_OTHER): Payer: Medicare Other | Admitting: Neurology

## 2022-09-13 DIAGNOSIS — R0683 Snoring: Secondary | ICD-10-CM

## 2022-09-13 DIAGNOSIS — G8929 Other chronic pain: Secondary | ICD-10-CM

## 2022-09-13 DIAGNOSIS — G5690 Unspecified mononeuropathy of unspecified upper limb: Secondary | ICD-10-CM

## 2022-09-13 DIAGNOSIS — M791 Myalgia, unspecified site: Secondary | ICD-10-CM

## 2022-09-13 DIAGNOSIS — G4719 Other hypersomnia: Secondary | ICD-10-CM

## 2022-09-13 DIAGNOSIS — E2749 Other adrenocortical insufficiency: Secondary | ICD-10-CM

## 2022-09-13 DIAGNOSIS — R5382 Chronic fatigue, unspecified: Secondary | ICD-10-CM

## 2022-09-16 ENCOUNTER — Telehealth: Payer: Self-pay | Admitting: *Deleted

## 2022-09-16 NOTE — Telephone Encounter (Signed)
LVM for pt letting her know results still pending. She completed on 09/13/22 and can take 2-3 weeks to get results back. Once we have results from MD, we will call her back to go over that with her.

## 2022-09-16 NOTE — Telephone Encounter (Signed)
Pt called for sleep study results. Please call [phone]

## 2022-09-20 DIAGNOSIS — M25542 Pain in joints of left hand: Secondary | ICD-10-CM | POA: Diagnosis not present

## 2022-09-20 DIAGNOSIS — M659 Synovitis and tenosynovitis, unspecified: Secondary | ICD-10-CM | POA: Diagnosis not present

## 2022-09-20 DIAGNOSIS — M25541 Pain in joints of right hand: Secondary | ICD-10-CM | POA: Diagnosis not present

## 2022-09-22 DIAGNOSIS — M25541 Pain in joints of right hand: Secondary | ICD-10-CM | POA: Diagnosis not present

## 2022-09-22 DIAGNOSIS — M659 Synovitis and tenosynovitis, unspecified: Secondary | ICD-10-CM | POA: Diagnosis not present

## 2022-09-22 DIAGNOSIS — M25542 Pain in joints of left hand: Secondary | ICD-10-CM | POA: Diagnosis not present

## 2022-09-24 ENCOUNTER — Telehealth: Payer: Self-pay | Admitting: Neurology

## 2022-09-24 DIAGNOSIS — R5382 Chronic fatigue, unspecified: Secondary | ICD-10-CM | POA: Insufficient documentation

## 2022-09-24 DIAGNOSIS — G4719 Other hypersomnia: Secondary | ICD-10-CM | POA: Insufficient documentation

## 2022-09-24 DIAGNOSIS — G8929 Other chronic pain: Secondary | ICD-10-CM | POA: Insufficient documentation

## 2022-09-24 NOTE — Procedures (Signed)
POLYSOMNOGRAPHY  INTERPRETATION REPORT   STUDY DATE:  09/13/2022     PATIENT NAME:  Meghan Tucker         DATE OF BIRTH:  1946/11/05  PATIENT ID:  [id number]    TYPE OF STUDY:  PSG  READING PHYSICIAN: Larey Seat, MD Dr. Krista Blue is neurologist. REFERRED BY: PCP Dr. Cher Nakai SCORING TECHNICIAN: Richard Miu, RPSGT   HISTORY: 08-17-2022 Meghan Tucker is 76 year-old Female patient and has a chief complaint of pain at night which is not allowing her "good " , sustained  and restful sleep, RLS but also recent weight gain.  Past medical history: adrenal insufficiency, HTN, GERD, sinusitis, congestion, coughing, migraines, thyroid disease.   ADDITIONAL INFORMATION:  The Epworth Sleepiness Scale was endorsed at 12 /24 points (scores above or equal to 10 are suggestive of hypersomnolence). FSS endorsed at  63 /63 points.  Height: 60 in Weight: 138 lbs (BMI 26) Neck Size: 16"   MEDICATIONS: Sectral, Prevagen, Wellbutrin XL, Calcium Carbonate, Bentyl, Cymbalta, Zetia, Fiber, Folvite, Lasix, Cortef, Atarax, Metamucil, Protonix, Align, Crestor, Trazodone, Ambien  TECHNICAL DESCRIPTION: A registered sleep technologist (RPSGT) was in attendance for the duration of the recording.  Data collection, scoring, video monitoring, and reporting were performed in compliance with the AASM Manual for the Scoring of Sleep and Associated Events; (Hypopnea is scored based on the criteria listed in Section VIII D. 1b in the AASM Manual V2.6 using a 4% oxygen desaturation rule or Hypopnea is scored based on the criteria listed in Section VIII D. 1a in the AASM Manual V2.6 using 3% oxygen desaturation and /or arousal rule).   SLEEP CONTINUITY AND SLEEP ARCHITECTURE:  Lights-out was at 21:56: and lights-on at  05:16:, with  7.3 hours ( 440 minutes)  of recording time . Total sleep time (TST) was 374.5 minutes with a normal sleep efficiency at 85.2%. Wake after sleep onset (WASO) time accounted for 32 minutes. Sleep latency was 32.5  minutes.  REM sleep latency was increased at 312.5 minutes. Of the total sleep time, the percentage of stage N1 sleep was 1.6%, stage N2 sleep was 56%, stage N3 sleep was 19.8%, and REM sleep was 22.6%. BODY POSITION:  TST was divided between the following sleep positions: 2.5% in supine; 97.5% in lateral sleep position 365 minutes = right sided 174 minutes (46%), left sided 191 minutes (51%), and prone 00 minutes (0%).  Total supine REM sleep time was 04 minutes (5% of total REM sleep)  RESPIRATORY MONITORING:  Based on CMS criteria (4% oxygen desaturation rule), 1 apnea (0 obstructive; 1 central; 0 mixed), and 28 hypopneas were seen.  The Apnea index (AI) was 0.2. Hypopnea index (HI) was 4.5. The total AHI (apnea-hypopnea index) was 4.6/h overall. AHI in supine 6.3/h, AHI in non-supine sleep was 2/h . There was a REM AHI of 2.8/h and 5.2/h NREM AHI,  13.3/h supine REM sleep AHI. OXIMETRY: Oxyhemoglobin Saturation Nadir during sleep was at 88 % from a mean of 93%.  Of the Total sleep time (TST) hypoxemia (<89%) was present for 0.8 minutes, or 0.2% of total sleep time.  LIMB MOVEMENTS: There were 64 periodic limb movements of sleep (10.3/h), of which 0 (0.0/h) were associated with an arousal. AROUSAL: There were 0 respiratory effort-related arousals (RERAs) scored. There were 58 arousals in total, for an arousal index of 7 /hour.  Of these, 9 were identified as respiratory-related arousals (1 /h), 0 were PLM-related arousals (0 /h), and 43 were non-specific arousals (7 /h).  There was one Stage R period observed on this study night, 23 awakenings (i.e. transitions to Stage W from any sleep stage), and 63 total stage transitions. EEG:  PSG EEG was of normal amplitude and frequency, with symmetric manifestation of sleep stages. EKG: The electrocardiogram documented all NSR except for a period of during REM sleep- I printed screen shots- the technologist stated this was artefact.   The average heart rate  during sleep was 72 bpm.  The heart rate during sleep varied between a minimum of 46 and a maximum of  123 bpm. AUDIO and VIDEO: No vocalization, rhythmic motor activity or complex movements. Snoring was mild to moderate.   IMPRESSION: 1) Sleep disordered breathing was present. But AHI of 5/h or below is not considered Sleep Apnea.    2) Limb movements were frequently seen but did rarely cause arousals. 3) Most arousals were spontaneous, unrelated to the physiological functions monitored during Polysomnography. Total sleep time was within normal limits at 374.5 minutes.  Sleep efficiency was normal at 85.2%.      RECOMMENDATIONS: I reviewed the sleep study for a 3 % desaturation threshold and still could not see the AHI rise above 5/h. I believe her sleep disorder is related to micro-arousals related to pain and discomfort.  Not sleeping in supine position decreases the snoring volume and the AHI.  RECOMMENDATIONS  Larey Seat, MD Medical Director of [facility] Sleep.

## 2022-09-27 ENCOUNTER — Ambulatory Visit (INDEPENDENT_AMBULATORY_CARE_PROVIDER_SITE_OTHER): Payer: Medicare Other | Admitting: Nurse Practitioner

## 2022-09-27 ENCOUNTER — Encounter: Payer: Self-pay | Admitting: Neurology

## 2022-09-27 ENCOUNTER — Encounter: Payer: Self-pay | Admitting: Nurse Practitioner

## 2022-09-27 VITALS — BP 124/72 | HR 72 | Ht 61.0 in | Wt 134.0 lb

## 2022-09-27 DIAGNOSIS — R143 Flatulence: Secondary | ICD-10-CM | POA: Diagnosis not present

## 2022-09-27 DIAGNOSIS — R194 Change in bowel habit: Secondary | ICD-10-CM | POA: Diagnosis not present

## 2022-09-27 DIAGNOSIS — K589 Irritable bowel syndrome without diarrhea: Secondary | ICD-10-CM

## 2022-09-27 NOTE — Patient Instructions (Addendum)
If you are age 76 or older, your body mass index should be between 23-30. Your Body mass index is 25.32 kg/m. If this is out of the aforementioned range listed, please consider follow up with your Primary Care Provider.  If you are age 6 or younger, your body mass index should be between 19-25. Your Body mass index is 25.32 kg/m. If this is out of the aformentioned range listed, please consider follow up with your Primary Care Provider.   ________________________________________________________  The [facility] providers would like to encourage you to use [facility] to communicate with providers for non-urgent requests or questions.  Due to long hold times on the telephone, sending your provider a message by [facility] may be a faster and more efficient way to get a response.  Please allow 48 business hours for a response.  Please remember that this is for non-urgent requests.  _______________________________________________________   Meghan Tucker have been given a testing kit to check for small intestine bacterial overgrowth (SIBO) which is completed by a company named [facility]. Make sure to return your test in the mail using the return mailing label given to you along with the kit. Your demographic and insurance information have already been sent to the company and they should be in contact with you over the next 1-2 weeks regarding this test. [facility] will collect an upfront charge of $99.74 for commercial insurance plans and $209.74 is you are paying cash. Make sure to discuss with [facility] PRIOR to having the test to see if they have gotten information from your insurance company as to how much your testing will cost out of pocket, if any. Please keep in mind that you will be getting a call from phone number [phone] or a similar number. If you do not hear from them within this time frame, please call our office at [phone] or call [facility] directly at [phone].    We  will contact you once your results are back from the SIBO test.  We have scheduled your follow up appointment with Dr.Gupta for 11/25/22 at 8:30 AM.  It was a pleasure to see you today!  Thank you for trusting me with your gastrointestinal care!

## 2022-09-27 NOTE — Progress Notes (Signed)
Chief Complaint:  bowel changes, gas   Assessment &  Plan   # 76 yo female with chronic intermittent loose stool / increased flatulence / history of IBS-D. Extensive previous evaluation unrevealing. Here with increase in chronic symptoms. Having increase flatulence and increased frequency of BMs which get progressively loose throughout the day. Had some recent medication changes. Specifically,  weaning off percocet over the last several weeks which may be contributing. SIBO is a consideration. Constipation with overflow is also a possibility but I 'm less suspicious of this.   Apparenlty didn't get SIBO test done at time of last visit when here with similar symptoms.  Unable to find results in Epic and she doesn't recall doing the test. For persistent / worsening symptoms will proceed with evaluation of SIBO . Follow up Dr. Lyndel Safe in a couple of months. In the interim I will call her with results and further recommendations.  If not improving will consider discontinuing fiber as it can cause bloating in some people.   HPI   Meghan Tucker is a 76 y.o. female known to Dr.  Lyndel Safe with a past medical history significant for chronic fatigue, anxiety, depression, migraine hypertension, osteoporosis, diverticulosis , small hiatal hernia See PMH /PSH for additional history   Patient has a long history of chronic GI issues and prior diagnosis of GERD and IBS/D.  She has seen several gastroenterologist in the past and had extensive work-up .  She was last seen in the office November 2021 by Nicoletta Ba, PA for evaluation of persistent belching, burping, gassiness, lethargy, nausea with poor appetite, intermittent sweating and chills, RLQ pain.  Amy kept her on omeprazole and famotidine as well as Bentyl.  She was started on a low gas diet, scheduled for CT scan of the abdomen and pelvis with contrast as well as a hydrogen test for SIBO.  Labs obtained,  including celiac serologies  and were all  unremarkable.  CT scan without any acute findings.   Interval History:  Meghan Tucker has diarrhea 3-4 times a week, increased over the the last couple of months from her normal pattern of about two loose bms a month. She has "a lot of gas". Takes imodium as needed.   Generally first BM of day is formed, subsequent BMs get more loose as the day progresses. She may have 4 BMs a day with the 4th one being the least formed. No blood in stool. She hasn't recently started any new medications but some have been adjusted. Being weaned from percocet over the last several weeks. Steriods were stopped recently and lasix dose reduced.    Previous GI Evaluation   Celiac serologies negative.   May 2016 EGD for dysphagia -ENDOSCOPIC IMPRESSION: 1. mild distal esophageal stricture status post dilation from 14-16 mm 2. Small reducible hiatal hernia 3. Mild-to-moderate antral gastritis. Status post biopsies to rule out H. pylor  EGD 06/09/2017: Hiatal hernia (Dr Earlean Shawl)  Colonoscopy 06/09/2017 - Sigmoid colonic diverticulosis, internal hemorrhoids.  Negative random colonic biopsies.  Ba swallow 02/01/2019: Small HH, 2 episodes of reflux. Ba tab passed without any difficulty   Labs:     Latest Ref Rng & Units 08/17/2022    4:22 PM 10/02/2021   12:00 AM 11/06/2020   12:00 AM  CBC  WBC 3.4 - 10.8 x10E3/uL 7.1  9.5     8.8      Hemoglobin 11.1 - 15.9 g/dL 13.6  12.3     14.1  Hematocrit 34.0 - 46.6 % 40.5  36     42      Platelets 150 - 450 x10E3/uL 313  302     244         This result is from an external source.       Latest Ref Rng & Units 08/17/2022    4:22 PM 10/02/2021   12:00 AM 04/17/2021    1:43 PM  Hepatic Function  Total Protein 6.0 - 8.5 g/dL 6.6   6.4   Albumin 3.8 - 4.8 g/dL 4.2  3.9     3.8   AST 0 - 40 IU/L 28  33     25   ALT 0 - 32 IU/L 24  30     30    Alk Phosphatase 44 - 121 IU/L 63  86     57   Total Bilirubin 0.0 - 1.2 mg/dL 0.4   0.7      This result is from an external  source.     Past Medical History:  Diagnosis Date   Adrenal insufficiency ([place])    Arthritis    "all over"   Chronic back pain    2 buldging disc,stenosis   Diverticulosis    Essential hypertension    takes Metoprolol daily   GERD (gastroesophageal reflux disease)    takes Omeprazole daily   History of hiatal hernia    History of kidney stones    History of kidney stones    History of migraine    none since menopause yrs ago   Hyperlipidemia    takes Fish Oil daily   Hypertension    takes Metoprolol daily   Insomnia    takes Ambien nightly   Sciatica    Sciatica    Thyroid disease    Tingling    right arm.Both feet    Tremor     Past Surgical History:  Procedure Laterality Date   CARPAL TUNNEL RELEASE Left    cataract surgery Bilateral    COLONOSCOPY     Dr. Jerilynn Mages in [place]   ESOPHAGOGASTRODUODENOSCOPY     Dr Jerilynn Mages in [place]   ESOPHAGOGASTRODUODENOSCOPY (EGD) WITH ESOPHAGEAL DILATION  05/14/2015   with Propofol   ESOPHAGOGASTRODUODENOSCOPY (EGD) WITH PROPOFOL  10/19/2013   GANGLION CYST EXCISION     HAMMERTOE RECONSTRUCTION WITH WEIL OSTEOTOMY Left 07/17/2015   Procedure: LEFT 2-3 WEIL OSTEOTOMY AND HAMMERTOE CORRECTION ;  Surgeon: [name], MD;  Location: [facility];  Service: Orthopedics;  Laterality: Left;    Current Medications, Allergies, Family History and Social History were reviewed in [employer] record.     Current Outpatient Medications  Medication Sig Dispense Refill   acebutolol (SECTRAL) 200 MG capsule Take 1 capsule (200 mg total) by mouth 2 (two) times daily. Need to request future refills from primary care doctor or schedule appointment for future refills 60 capsule 0   Apoaequorin (PREVAGEN PO) Take 1 tablet by mouth daily. Prevagen     buPROPion (WELLBUTRIN XL) 150 MG 24 hr tablet Take 150 mg by mouth every morning.     CALCIUM CITRATE PO Take 1 tablet by mouth every other day.     dicyclomine (BENTYL)  10 MG capsule Take 1 capsule (10 mg total) by mouth 2 (two) times daily. 60 capsule 3   DULoxetine (CYMBALTA) 20 MG capsule Take 1 capsule (20 mg total) by mouth daily. 30 capsule 6   DULoxetine (CYMBALTA) 60 MG capsule Take  1 capsule (60 mg total) by mouth daily. 30 capsule 6   ezetimibe (ZETIA) 10 MG tablet Take 10 mg by mouth daily.     FIBER PO Take 1 tablet by mouth daily.     folic acid (FOLVITE) 1 MG tablet Take 1 mg by mouth daily.     furosemide (LASIX) 20 MG tablet Take 10 mg by mouth daily.     hydrocortisone (CORTEF) 10 MG tablet Take 10 mg by mouth daily.     hydrOXYzine (ATARAX/VISTARIL) 10 MG tablet Take 10 mg by mouth 2 (two) times daily.     METAMUCIL FIBER PO Take 2 tablets by mouth daily.     pantoprazole (PROTONIX) 40 MG tablet Take 1 tablet (40 mg total) by mouth daily. 30 tablet 11   Probiotic Product (ALIGN PO) Take by mouth.     Psyllium (METAMUCIL PO) Take by mouth every morning. 3 in 1 fiber     rosuvastatin (CRESTOR) 10 MG tablet Take 1 tablet (10 mg total) by mouth daily. Need appointment for future refills 90 tablet 0   traZODone (DESYREL) 50 MG tablet Take 50 mg by mouth at bedtime.     zolpidem (AMBIEN CR) 12.5 MG CR tablet Take 12.5 mg by mouth at bedtime.     No current facility-administered medications for this visit.    Review of Systems: No chest pain. No shortness of breath. No urinary complaints.    Physical Exam  Wt Readings from Last 3 Encounters:  10/02/21 133 lb 14.4 oz (60.7 kg)  04/17/21 133 lb (60.3 kg)  02/26/21 128 lb 6.4 oz (58.2 kg)    BP 124/72   Pulse 72   Ht 5' 1"  (1.549 m)   Wt 134 lb (60.8 kg)   BMI 25.32 kg/m  Constitutional:  Generally well appearing female in no acute distress. Psychiatric: Pleasant. Normal mood and affect. Behavior is normal. EENT: Pupils normal.  Conjunctivae are normal. No scleral icterus. Neck supple.  Cardiovascular: Normal rate, regular rhythm. No edema Pulmonary/chest: Effort normal and breath  sounds normal. No wheezing, rales or rhonchi. Abdominal: Soft, nondistended, nontender. Bowel sounds active throughout. There are no masses palpable. No hepatomegaly. Neurological: Alert and oriented to person place and time. Skin: Skin is warm and dry. No rashes noted.  Tye Savoy, NP  09/27/2022, 8:30 AM

## 2022-09-28 DIAGNOSIS — R5382 Chronic fatigue, unspecified: Secondary | ICD-10-CM | POA: Diagnosis not present

## 2022-09-28 DIAGNOSIS — M25542 Pain in joints of left hand: Secondary | ICD-10-CM | POA: Diagnosis not present

## 2022-09-28 DIAGNOSIS — M791 Myalgia, unspecified site: Secondary | ICD-10-CM | POA: Diagnosis not present

## 2022-09-28 DIAGNOSIS — M25541 Pain in joints of right hand: Secondary | ICD-10-CM | POA: Diagnosis not present

## 2022-09-28 DIAGNOSIS — M659 Synovitis and tenosynovitis, unspecified: Secondary | ICD-10-CM | POA: Diagnosis not present

## 2022-09-30 DIAGNOSIS — M25541 Pain in joints of right hand: Secondary | ICD-10-CM | POA: Diagnosis not present

## 2022-09-30 DIAGNOSIS — M659 Synovitis and tenosynovitis, unspecified: Secondary | ICD-10-CM | POA: Diagnosis not present

## 2022-09-30 DIAGNOSIS — M25542 Pain in joints of left hand: Secondary | ICD-10-CM | POA: Diagnosis not present

## 2022-10-01 DIAGNOSIS — J310 Chronic rhinitis: Secondary | ICD-10-CM | POA: Diagnosis not present

## 2022-10-01 DIAGNOSIS — N959 Unspecified menopausal and perimenopausal disorder: Secondary | ICD-10-CM | POA: Diagnosis not present

## 2022-10-01 DIAGNOSIS — J309 Allergic rhinitis, unspecified: Secondary | ICD-10-CM | POA: Diagnosis not present

## 2022-10-01 DIAGNOSIS — G2581 Restless legs syndrome: Secondary | ICD-10-CM | POA: Diagnosis not present

## 2022-10-01 DIAGNOSIS — K219 Gastro-esophageal reflux disease without esophagitis: Secondary | ICD-10-CM | POA: Diagnosis not present

## 2022-10-01 DIAGNOSIS — G4700 Insomnia, unspecified: Secondary | ICD-10-CM | POA: Diagnosis not present

## 2022-10-01 DIAGNOSIS — K589 Irritable bowel syndrome without diarrhea: Secondary | ICD-10-CM | POA: Diagnosis not present

## 2022-10-01 DIAGNOSIS — R748 Abnormal levels of other serum enzymes: Secondary | ICD-10-CM | POA: Diagnosis not present

## 2022-10-01 DIAGNOSIS — E7800 Pure hypercholesterolemia, unspecified: Secondary | ICD-10-CM | POA: Diagnosis not present

## 2022-10-01 DIAGNOSIS — E538 Deficiency of other specified B group vitamins: Secondary | ICD-10-CM | POA: Diagnosis not present

## 2022-10-01 DIAGNOSIS — I10 Essential (primary) hypertension: Secondary | ICD-10-CM | POA: Diagnosis not present

## 2022-10-01 DIAGNOSIS — M659 Synovitis and tenosynovitis, unspecified: Secondary | ICD-10-CM | POA: Diagnosis not present

## 2022-10-01 DIAGNOSIS — E2740 Unspecified adrenocortical insufficiency: Secondary | ICD-10-CM | POA: Diagnosis not present

## 2022-10-05 ENCOUNTER — Telehealth: Payer: Self-pay | Admitting: Nurse Practitioner

## 2022-10-05 DIAGNOSIS — M25542 Pain in joints of left hand: Secondary | ICD-10-CM | POA: Diagnosis not present

## 2022-10-05 DIAGNOSIS — M659 Synovitis and tenosynovitis, unspecified: Secondary | ICD-10-CM | POA: Diagnosis not present

## 2022-10-05 DIAGNOSIS — M25541 Pain in joints of right hand: Secondary | ICD-10-CM | POA: Diagnosis not present

## 2022-10-05 NOTE — Telephone Encounter (Signed)
Spoke with pt's son and pt son stated he received mychart message and had no further questions.

## 2022-10-05 NOTE — Telephone Encounter (Signed)
Inbound call from patient son stating he spoke with you in regards to being added to patient DPR. States he will be loading an authorization form into patient mychart. Please give a call back to further advise.  Thank you

## 2022-10-07 DIAGNOSIS — M8589 Other specified disorders of bone density and structure, multiple sites: Secondary | ICD-10-CM | POA: Diagnosis not present

## 2022-10-07 DIAGNOSIS — N959 Unspecified menopausal and perimenopausal disorder: Secondary | ICD-10-CM | POA: Diagnosis not present

## 2022-10-08 DIAGNOSIS — M659 Synovitis and tenosynovitis, unspecified: Secondary | ICD-10-CM | POA: Diagnosis not present

## 2022-10-08 DIAGNOSIS — M25542 Pain in joints of left hand: Secondary | ICD-10-CM | POA: Diagnosis not present

## 2022-10-08 DIAGNOSIS — M25541 Pain in joints of right hand: Secondary | ICD-10-CM | POA: Diagnosis not present

## 2022-10-13 DIAGNOSIS — M659 Synovitis and tenosynovitis, unspecified: Secondary | ICD-10-CM | POA: Diagnosis not present

## 2022-10-13 DIAGNOSIS — M25542 Pain in joints of left hand: Secondary | ICD-10-CM | POA: Diagnosis not present

## 2022-10-13 DIAGNOSIS — M25541 Pain in joints of right hand: Secondary | ICD-10-CM | POA: Diagnosis not present

## 2022-10-15 DIAGNOSIS — R5383 Other fatigue: Secondary | ICD-10-CM | POA: Diagnosis not present

## 2022-10-17 NOTE — Progress Notes (Signed)
Agree with assessment/plan.  Raj Leronda Lewers, MD  GI [phone]  

## 2022-10-21 DIAGNOSIS — J309 Allergic rhinitis, unspecified: Secondary | ICD-10-CM | POA: Diagnosis not present

## 2022-10-21 DIAGNOSIS — I10 Essential (primary) hypertension: Secondary | ICD-10-CM | POA: Diagnosis not present

## 2022-10-21 DIAGNOSIS — J310 Chronic rhinitis: Secondary | ICD-10-CM | POA: Diagnosis not present

## 2022-10-21 DIAGNOSIS — H9193 Unspecified hearing loss, bilateral: Secondary | ICD-10-CM | POA: Diagnosis not present

## 2022-10-21 DIAGNOSIS — E2740 Unspecified adrenocortical insufficiency: Secondary | ICD-10-CM | POA: Diagnosis not present

## 2022-10-21 DIAGNOSIS — R748 Abnormal levels of other serum enzymes: Secondary | ICD-10-CM | POA: Diagnosis not present

## 2022-10-21 DIAGNOSIS — N959 Unspecified menopausal and perimenopausal disorder: Secondary | ICD-10-CM | POA: Diagnosis not present

## 2022-10-21 DIAGNOSIS — K219 Gastro-esophageal reflux disease without esophagitis: Secondary | ICD-10-CM | POA: Diagnosis not present

## 2022-10-21 DIAGNOSIS — M659 Synovitis and tenosynovitis, unspecified: Secondary | ICD-10-CM | POA: Diagnosis not present

## 2022-10-21 DIAGNOSIS — G2581 Restless legs syndrome: Secondary | ICD-10-CM | POA: Diagnosis not present

## 2022-10-21 DIAGNOSIS — K589 Irritable bowel syndrome without diarrhea: Secondary | ICD-10-CM | POA: Diagnosis not present

## 2022-10-21 DIAGNOSIS — G4700 Insomnia, unspecified: Secondary | ICD-10-CM | POA: Diagnosis not present

## 2022-10-23 DIAGNOSIS — Z23 Encounter for immunization: Secondary | ICD-10-CM | POA: Diagnosis not present

## 2022-11-10 DIAGNOSIS — M76829 Posterior tibial tendinitis, unspecified leg: Secondary | ICD-10-CM

## 2022-11-10 DIAGNOSIS — M76822 Posterior tibial tendinitis, left leg: Secondary | ICD-10-CM | POA: Diagnosis not present

## 2022-11-10 DIAGNOSIS — M19072 Primary osteoarthritis, left ankle and foot: Secondary | ICD-10-CM | POA: Diagnosis not present

## 2022-11-10 DIAGNOSIS — M79672 Pain in left foot: Secondary | ICD-10-CM | POA: Diagnosis not present

## 2022-11-10 HISTORY — DX: Posterior tibial tendinitis, unspecified leg: M76.829

## 2022-11-15 DIAGNOSIS — R2689 Other abnormalities of gait and mobility: Secondary | ICD-10-CM | POA: Diagnosis not present

## 2022-11-15 DIAGNOSIS — M76822 Posterior tibial tendinitis, left leg: Secondary | ICD-10-CM | POA: Diagnosis not present

## 2022-11-16 DIAGNOSIS — M76822 Posterior tibial tendinitis, left leg: Secondary | ICD-10-CM | POA: Diagnosis not present

## 2022-11-16 DIAGNOSIS — R2689 Other abnormalities of gait and mobility: Secondary | ICD-10-CM | POA: Diagnosis not present

## 2022-11-20 DIAGNOSIS — G4700 Insomnia, unspecified: Secondary | ICD-10-CM | POA: Diagnosis not present

## 2022-11-20 DIAGNOSIS — K219 Gastro-esophageal reflux disease without esophagitis: Secondary | ICD-10-CM | POA: Diagnosis not present

## 2022-11-20 DIAGNOSIS — N959 Unspecified menopausal and perimenopausal disorder: Secondary | ICD-10-CM | POA: Diagnosis not present

## 2022-11-20 DIAGNOSIS — G2581 Restless legs syndrome: Secondary | ICD-10-CM | POA: Diagnosis not present

## 2022-11-20 DIAGNOSIS — R748 Abnormal levels of other serum enzymes: Secondary | ICD-10-CM | POA: Diagnosis not present

## 2022-11-20 DIAGNOSIS — J309 Allergic rhinitis, unspecified: Secondary | ICD-10-CM | POA: Diagnosis not present

## 2022-11-20 DIAGNOSIS — I10 Essential (primary) hypertension: Secondary | ICD-10-CM | POA: Diagnosis not present

## 2022-11-20 DIAGNOSIS — E538 Deficiency of other specified B group vitamins: Secondary | ICD-10-CM | POA: Diagnosis not present

## 2022-11-20 DIAGNOSIS — J310 Chronic rhinitis: Secondary | ICD-10-CM | POA: Diagnosis not present

## 2022-11-20 DIAGNOSIS — E2740 Unspecified adrenocortical insufficiency: Secondary | ICD-10-CM | POA: Diagnosis not present

## 2022-11-20 DIAGNOSIS — M159 Polyosteoarthritis, unspecified: Secondary | ICD-10-CM | POA: Diagnosis not present

## 2022-11-20 DIAGNOSIS — K589 Irritable bowel syndrome without diarrhea: Secondary | ICD-10-CM | POA: Diagnosis not present

## 2022-11-25 ENCOUNTER — Telehealth: Payer: Self-pay | Admitting: Pharmacy Technician

## 2022-11-25 ENCOUNTER — Encounter: Payer: Self-pay | Admitting: Gastroenterology

## 2022-11-25 ENCOUNTER — Ambulatory Visit (INDEPENDENT_AMBULATORY_CARE_PROVIDER_SITE_OTHER): Payer: Medicare Other | Admitting: Gastroenterology

## 2022-11-25 ENCOUNTER — Telehealth: Payer: Self-pay | Admitting: Gastroenterology

## 2022-11-25 ENCOUNTER — Other Ambulatory Visit (HOSPITAL_COMMUNITY): Payer: Self-pay

## 2022-11-25 ENCOUNTER — Other Ambulatory Visit (INDEPENDENT_AMBULATORY_CARE_PROVIDER_SITE_OTHER): Payer: Medicare Other

## 2022-11-25 VITALS — BP 122/84 | HR 76 | Ht 61.0 in | Wt 139.0 lb

## 2022-11-25 DIAGNOSIS — K580 Irritable bowel syndrome with diarrhea: Secondary | ICD-10-CM

## 2022-11-25 DIAGNOSIS — K638219 Small intestinal bacterial overgrowth, unspecified: Secondary | ICD-10-CM | POA: Diagnosis not present

## 2022-11-25 LAB — COMPREHENSIVE METABOLIC PANEL
ALT: 16 U/L (ref 0–35)
AST: 18 U/L (ref 0–37)
Albumin: 4 g/dL (ref 3.5–5.2)
Alkaline Phosphatase: 59 U/L (ref 39–117)
BUN: 22 mg/dL (ref 6–23)
CO2: 26 mEq/L (ref 19–32)
Calcium: 9.4 mg/dL (ref 8.4–10.5)
Chloride: 100 mEq/L (ref 96–112)
Creatinine, Ser: 1.09 mg/dL (ref 0.40–1.20)
GFR: 49.51 mL/min — ABNORMAL LOW (ref >60.00)
Glucose, Bld: 111 mg/dL — ABNORMAL HIGH (ref 70–99)
Potassium: 4 mEq/L (ref 3.5–5.1)
Sodium: 136 mEq/L (ref 135–145)
Total Bilirubin: 0.5 mg/dL (ref 0.2–1.2)
Total Protein: 6.8 g/dL (ref 6.0–8.3)

## 2022-11-25 LAB — CBC WITH DIFFERENTIAL/PLATELET
Basophils Absolute: 0 10*3/uL (ref 0.0–0.1)
Basophils Relative: 0.5 % (ref 0.0–3.0)
Eosinophils Absolute: 0.1 10*3/uL (ref 0.0–0.7)
Eosinophils Relative: 1.4 % (ref 0.0–5.0)
HCT: 41.1 % (ref 36.0–46.0)
Hemoglobin: 14.3 g/dL (ref 12.0–15.0)
Lymphocytes Relative: 21.4 % (ref 12.0–46.0)
Lymphs Abs: 1.9 10*3/uL (ref 0.7–4.0)
MCHC: 34.7 g/dL (ref 30.0–36.0)
MCV: 91.8 fl (ref 78.0–100.0)
Monocytes Absolute: 1 10*3/uL (ref 0.1–1.0)
Monocytes Relative: 11.1 % (ref 3.0–12.0)
Neutro Abs: 5.9 10*3/uL (ref 1.4–7.7)
Neutrophils Relative %: 65.6 % (ref 43.0–77.0)
Platelets: 297 10*3/uL (ref 150.0–400.0)
RBC: 4.48 Mil/uL (ref 3.87–5.11)
RDW: 14.5 % (ref 11.5–15.5)
WBC: 8.9 10*3/uL (ref 4.0–10.5)

## 2022-11-25 LAB — C-REACTIVE PROTEIN: CRP: <1 mg/dL (ref 0.5–20.0)

## 2022-11-25 LAB — FOLATE: Folate: 9.3 ng/mL (ref >5.9)

## 2022-11-25 LAB — VITAMIN B12: Vitamin B-12: 752 pg/mL (ref 211–911)

## 2022-11-25 MED ORDER — RIFAXIMIN 550 MG PO TABS: 550.0000 mg | ORAL_TABLET | Freq: Three times a day (TID) | ORAL | 0 refills | Status: DC

## 2022-11-25 NOTE — Progress Notes (Signed)
Chief Complaint: FU  Referring Provider:  Cher Nakai, MD      ASSESSMENT AND PLAN;   #1. SIBO  #2. IBS-D  Plan: -Rifaxamin '550mg'$  po TID x 2 weeks. -Call in 2 weeks -Check CBC, CMP, CRP, B12, folate   HPI:    Meghan Tucker is a 76 y.o. female  For FU  Seen by Domenica Fail.  SIBO breath test-highly suggestive of small bowel bacterial overgrowth.  She has postprandial abdominal bloating and intermittent postprandial diarrhea without nocturnal symptoms.  Abdo pain/discomfort 1 hour after eating.  Better with Align. Some nausea but no vomiting.  Extensive GI evaluation as below.  Occ heartburn, better with as needed omeprazole.  No odynophagia or dysphagia.  No wt loss  Wt Readings from Last 3 Encounters:  11/25/22 139 lb (63 kg)  09/27/22 134 lb (60.8 kg)  10/02/21 133 lb 14.4 oz (60.7 kg)          Previous GI Evaluation    Celiac serologies negative.   CT AP 10/2020 with contrast: Negative.   May 2016 EGD for dysphagia -ENDOSCOPIC IMPRESSION: 1. mild distal esophageal stricture status post dilation from 14-16 mm 2. Small reducible hiatal hernia 3. Mild-to-moderate antral gastritis. Status post biopsies to rule out H. pylor   EGD 06/09/2017: Hiatal hernia (Dr Earlean Shawl)   Colonoscopy 06/09/2017 - Sigmoid colonic diverticulosis, internal hemorrhoids.  Negative random colonic biopsies.   Ba swallow 02/01/2019: Small HH, 2 episodes of reflux. Ba tab passed without any difficulty      Past Medical History:  Diagnosis Date   Adrenal insufficiency ([place])    Arthritis    "all over"   Chronic back pain    2 buldging disc,stenosis   Diverticulosis    Essential hypertension    takes Metoprolol daily   GERD (gastroesophageal reflux disease)    takes Omeprazole daily   History of hiatal hernia    History of kidney stones    History of kidney stones    History of migraine    none since menopause yrs ago   Hyperlipidemia    takes Fish Oil daily    Hypertension    takes Metoprolol daily   Insomnia    takes Ambien nightly   Sciatica    Sciatica    Thyroid disease    Tingling    right arm.Both feet    Tremor     Past Surgical History:  Procedure Laterality Date   CARPAL TUNNEL RELEASE Left    cataract surgery Bilateral    COLONOSCOPY     Dr. Jerilynn Mages in [place]   ESOPHAGOGASTRODUODENOSCOPY     Dr Jerilynn Mages in [place]   ESOPHAGOGASTRODUODENOSCOPY (EGD) WITH ESOPHAGEAL DILATION  05/14/2015   with Propofol   ESOPHAGOGASTRODUODENOSCOPY (EGD) WITH PROPOFOL  10/19/2013   GANGLION CYST EXCISION     HAMMERTOE RECONSTRUCTION WITH WEIL OSTEOTOMY Left 07/17/2015   Procedure: LEFT 2-3 WEIL OSTEOTOMY AND HAMMERTOE CORRECTION ;  Surgeon: [name], MD;  Location: [facility];  Service: Orthopedics;  Laterality: Left;    Family History  Problem Relation Age of Onset   Heart disease Father    Heart attack Father 57   Stroke Mother 57   Colon cancer Neg Hx    Esophageal cancer Neg Hx    Cancer Neg Hx     Social History   Tobacco Use   Smoking status: Never   Smokeless tobacco: Never  Vaping Use   Vaping Use: Never used  Substance Use  Topics   Alcohol use: Yes    Comment: 1-2 glasses champagne nightly   Drug use: Never    Current Outpatient Medications  Medication Sig Dispense Refill   acebutolol (SECTRAL) 200 MG capsule Take 1 capsule by mouth daily.     buPROPion (WELLBUTRIN XL) 150 MG 24 hr tablet Take 150 mg by mouth every morning.     CALCIUM CITRATE PO Take 1 tablet by mouth every other day.     citalopram (CELEXA) 10 MG tablet Take 10 mg by mouth daily.     dicyclomine (BENTYL) 10 MG capsule Take 1 capsule (10 mg total) by mouth 2 (two) times daily. 60 capsule 3   ezetimibe (ZETIA) 10 MG tablet Take 10 mg by mouth daily.     furosemide (LASIX) 20 MG tablet Take 10 mg by mouth every other day.     hydrOXYzine (ATARAX/VISTARIL) 10 MG tablet Take 10 mg by mouth 2 (two) times daily.     Inulin (FIBER CHOICE PO) Take  by mouth.     NON FORMULARY Metamucil 1 a day     traZODone (DESYREL) 50 MG tablet Take 50 mg by mouth at bedtime.     zolpidem (AMBIEN) 5 MG tablet TAKE 1/2 TABLET AT NIGHT FOR SLEEP AS NEEDED. MAY TAKE AN EXTRA 1/2 TABLET IF NEEDED.     Apoaequorin (PREVAGEN PO) Take 1 tablet by mouth daily. Prevagen (Patient not taking: Reported on 11/25/2022)     No current facility-administered medications for this visit.    Allergies  Allergen Reactions   Atorvastatin Other (See Comments)    MYALGIAS MYALGIAS Other reaction(s): Myalgias (intolerance), Other, Other (See Comments) MYALGIAS MYALGIAS MYALGIAS   Rosuvastatin Other (See Comments)    MYALGIAS MYALGIAS Other reaction(s): Myalgias (intolerance), Other, Other (See Comments) MYALGIAS MYALGIAS MYALGIAS    Review of Systems:  neg     Physical Exam:    BP 122/84   Pulse 76   Ht '5\' 1"'$  (1.549 m)   Wt 139 lb (63 kg)   BMI 26.26 kg/m  Wt Readings from Last 3 Encounters:  11/25/22 139 lb (63 kg)  09/27/22 134 lb (60.8 kg)  10/02/21 133 lb 14.4 oz (60.7 kg)   Constitutional:  Well-developed, in no acute distress. Psychiatric: Normal mood and affect. Behavior is normal. HEENT: Pupils normal.  Conjunctivae are normal. No scleral icterus. Cardiovascular: Normal rate, regular rhythm. No edema Pulmonary/chest: Effort normal and breath sounds normal. No wheezing, rales or rhonchi. Abdominal: Soft, nondistended. Nontender. Bowel sounds active throughout. There are no masses palpable. No hepatomegaly. Rectal: Deferred Neurological: Alert and oriented to person place and time. Skin: Skin is warm and dry. No rashes noted.  Data Reviewed: I have personally reviewed following labs and imaging studies  CBC:    Latest Ref Rng & Units 08/17/2022    4:22 PM 10/02/2021   12:00 AM 11/06/2020   12:00 AM  CBC  WBC 3.4 - 10.8 x10E3/uL 7.1  9.5     8.8      Hemoglobin 11.1 - 15.9 g/dL 13.6  12.3     14.1      Hematocrit 34.0 - 46.6 % 40.5   36     42      Platelets 150 - 450 x10E3/uL 313  302     244         This result is from an external source.    CMP:    Latest Ref Rng & Units 08/17/2022  4:22 PM 10/02/2021   12:00 AM 04/17/2021    1:43 PM  CMP  Glucose 70 - 99 mg/dL 88   106   BUN 8 - 27 mg/dL 17  33     23   Creatinine 0.57 - 1.00 mg/dL 1.16  1.3     0.96   Sodium 134 - 144 mmol/L 141  138     137   Potassium 3.5 - 5.2 mmol/L 5.0  4.6     4.4   Chloride 96 - 106 mmol/L 102  101     101   CO2 20 - 29 mmol/L '25  30     28   '$ Calcium 8.7 - 10.3 mg/dL 9.8  9.0     9.5   Total Protein 6.0 - 8.5 g/dL 6.6   6.4   Total Bilirubin 0.0 - 1.2 mg/dL 0.4   0.7   Alkaline Phos 44 - 121 IU/L 63  86     57   AST 0 - 40 IU/L 28  33     25   ALT 0 - 32 IU/L '24  30     30      '$ This result is from an external source.        Carmell Austria, MD 11/25/2022, 9:02 AM  Cc: Cher Nakai, MD

## 2022-11-25 NOTE — Telephone Encounter (Signed)
Patient is calling states her current pharmacy is not able to access her Meghan Tucker Rx and it will need to be sent to a different pharmacy. Please advise

## 2022-11-25 NOTE — Telephone Encounter (Signed)
Patient Advocate Encounter  Received notification from [facility] that prior authorization for XIFAXAN '550MG'$  is required.   PA submitted on 11.30.23 Key [id number] Status is pending

## 2022-11-25 NOTE — Patient Instructions (Addendum)
_______________________________________________________  If you are age 75 or older, your body mass index should be between 23-30. Your Body mass index is 26.26 kg/m. If this is out of the aforementioned range listed, please consider follow up with your Primary Care Provider.  If you are age 48 or younger, your body mass index should be between 19-25. Your Body mass index is 26.26 kg/m. If this is out of the aformentioned range listed, please consider follow up with your Primary Care Provider.   ________________________________________________________  The [facility] providers would like to encourage you to use [facility] to communicate with providers for non-urgent requests or questions.  Due to long hold times on the telephone, sending your provider a message by [facility] may be a faster and more efficient way to get a response.  Please allow 48 business hours for a response.  Please remember that this is for non-urgent requests.  _______________________________________________________  Your provider has requested that you go to the basement level for lab work before leaving today. Press "B" on the elevator. The lab is located at the first door on the left as you exit the elevator.  We have sent the following medications to your pharmacy for you to pick up at your convenience: Xifaxan '550mg'$  3 times daily for 14 days   Call us in 2 weeks with an update to the nurse.  Thank you,  Dr. Jackquline Denmark

## 2022-11-25 NOTE — Telephone Encounter (Signed)
Sent to [facility] per patient. Was told to call back if still having problems

## 2022-11-26 NOTE — Telephone Encounter (Signed)
Patient's husband is calling states that there was no prescription at the pharmacy and also states he was told by the pharmacist it was going to be $1500+ is wanting some advise on what to do. Please advise

## 2022-11-30 DIAGNOSIS — L820 Inflamed seborrheic keratosis: Secondary | ICD-10-CM | POA: Diagnosis not present

## 2022-11-30 NOTE — Telephone Encounter (Signed)
On 11-26-2022 patient picked up partial fill and paid 300 something dollars and then on 11-30-2022 patient picked up remainder and paid 400 dollars. Picked it up from the [facility] in [address]. She had it transferred from [facility]

## 2022-12-02 DIAGNOSIS — R5382 Chronic fatigue, unspecified: Secondary | ICD-10-CM | POA: Diagnosis not present

## 2022-12-06 ENCOUNTER — Encounter: Payer: Self-pay | Admitting: Nurse Practitioner

## 2022-12-08 NOTE — Telephone Encounter (Signed)
Patient Advocate Encounter  Prior Authorization for XIFAXAN '550MG'$  has been approved.    PA# [id number] Effective dates: 12.1.23 through 12.31.24  Nahshon Reich B. CPhT P: [phone] F: [phone]

## 2023-01-04 ENCOUNTER — Telehealth: Payer: Self-pay

## 2023-01-04 NOTE — Patient Outreach (Signed)
  Care Coordination   01/04/2023 Name: Meghan Tucker MRN: [id number] DOB: [birth year]   Care Coordination Outreach Attempts:  An unsuccessful telephone outreach was attempted today to offer the patient information about available care coordination services as a benefit of their health plan.   Follow Up Plan:  Additional outreach attempts will be made to offer the patient care coordination information and services.   Encounter Outcome:  No Answer   Care Coordination Interventions:  No, not indicated    Tomasa Rand, RN, BSN, [facility] [employer] [phone]

## 2023-01-06 ENCOUNTER — Telehealth: Payer: Self-pay

## 2023-01-06 NOTE — Patient Outreach (Signed)
  Care Coordination   Initial Visit Note   01/06/2023 Name: Meghan Tucker MRN: [id number] DOB: January 05, 1946  Meghan Tucker is a 77 y.o. year old female who sees Cher Nakai, MD for primary care. I spoke with  [name] by phone today.  What matters to the patients health and wellness today?  Placed call to patient to review [facility] care coordination program.  Patient reports that she followed MD to his pricate practice. Dr. Truman Hayward.  Patient is no longer eligible for services as Dr. Truman Hayward does not participate in [facility].     SDOH assessments and interventions completed:  No     Care Coordination Interventions:  No, not indicated   Follow up plan: No further intervention required.   Encounter Outcome:  Pt. Visit Completed   Tomasa Rand, RN, BSN, [facility] [place] Coordinator [phone]

## 2023-01-13 ENCOUNTER — Ambulatory Visit: Payer: Medicare Other | Admitting: Gastroenterology

## 2023-01-17 DIAGNOSIS — M65331 Trigger finger, right middle finger: Secondary | ICD-10-CM | POA: Insufficient documentation

## 2023-01-17 DIAGNOSIS — M65311 Trigger thumb, right thumb: Secondary | ICD-10-CM | POA: Insufficient documentation

## 2023-01-17 HISTORY — DX: Trigger thumb, right thumb: M65.311

## 2023-02-02 NOTE — Telephone Encounter (Signed)
error 

## 2023-03-04 ENCOUNTER — Ambulatory Visit: Payer: Medicare Other | Admitting: Gastroenterology

## 2023-04-19 ENCOUNTER — Ambulatory Visit: Payer: Medicare Other | Admitting: Gastroenterology

## 2023-04-19 ENCOUNTER — Telehealth: Payer: Self-pay | Admitting: Gastroenterology

## 2023-04-24 NOTE — Telephone Encounter (Signed)
Prior to the appointment, lets  -Trial of cholestyramine 4 g p.o. daily.  Can put it in applesauce.  Take 2 hours before or after rest of the medications. -Please make sure that she is not taking Mg supplements. (Multivitamins or if her calcium has magnesium added).  If yes, stop. -Check stool for GI pathogens, calprotectin, fecal elastase -Can continue Imodium on as-needed basis  RG

## 2023-04-25 ENCOUNTER — Other Ambulatory Visit: Payer: Self-pay

## 2023-04-25 DIAGNOSIS — R197 Diarrhea, unspecified: Secondary | ICD-10-CM

## 2023-04-25 MED ORDER — CHOLESTYRAMINE 4 GM/DOSE PO POWD: ORAL | 1 refills | Status: DC

## 2023-04-29 ENCOUNTER — Other Ambulatory Visit: Payer: Medicare Other

## 2023-05-10 ENCOUNTER — Other Ambulatory Visit: Payer: Medicare Other

## 2023-05-10 DIAGNOSIS — R197 Diarrhea, unspecified: Secondary | ICD-10-CM

## 2023-05-11 LAB — GI PROFILE, STOOL, PCR

## 2023-05-16 LAB — PANCREATIC ELASTASE, FECAL: Pancreatic Elastase, Fecal: 68 ug Elast./g — ABNORMAL LOW (ref >200)

## 2023-05-17 LAB — CALPROTECTIN, FECAL: Calprotectin, Fecal: 48 ug/g (ref 0–120)

## 2023-05-27 ENCOUNTER — Telehealth: Payer: Self-pay | Admitting: Gastroenterology

## 2023-05-27 ENCOUNTER — Other Ambulatory Visit: Payer: Self-pay | Admitting: *Deleted

## 2023-05-27 NOTE — Telephone Encounter (Signed)
Patient calling states pharmacy has not received her medication yet. Please advise

## 2023-05-27 NOTE — Telephone Encounter (Signed)
Dr Chales Abrahams-  You recommended patient start Creon (lip 40K)-1 with each meal. Just want to clarify- did you want Zenpep 40k or did you want Creon 36k?

## 2023-05-30 MED ORDER — ZENPEP 40000-126000 UNITS PO CPEP: 40000.0000 [IU] | ORAL_CAPSULE | Freq: Three times a day (TID) | ORAL | 3 refills | Status: DC

## 2023-05-30 NOTE — Telephone Encounter (Signed)
Zenpep 40,000 sent for 1 with each meal to pharmacy

## 2023-05-30 NOTE — Telephone Encounter (Signed)
[facility]te[facility] Morita called to ask for a script of 100 for the patient since they want to order the whole bottle and it is 100 dollars. Script sent

## 2023-05-30 NOTE — Addendum Note (Signed)
Addended by: Alberteen Sam E on: 05/30/2023 04:28 PM   Modules accepted: Orders

## 2023-05-30 NOTE — Telephone Encounter (Signed)
Zenpep 40000 sent to do 1 capsule with each meal

## 2023-05-30 NOTE — Telephone Encounter (Signed)
Patient made aware.

## 2023-06-14 ENCOUNTER — Encounter: Payer: Self-pay | Admitting: Gastroenterology

## 2023-06-14 ENCOUNTER — Other Ambulatory Visit (INDEPENDENT_AMBULATORY_CARE_PROVIDER_SITE_OTHER): Payer: Medicare Other

## 2023-06-14 ENCOUNTER — Telehealth: Payer: Self-pay | Admitting: Gastroenterology

## 2023-06-14 ENCOUNTER — Ambulatory Visit (INDEPENDENT_AMBULATORY_CARE_PROVIDER_SITE_OTHER): Payer: Medicare Other | Admitting: Gastroenterology

## 2023-06-14 VITALS — BP 118/66 | HR 95 | Ht 60.0 in | Wt 131.6 lb

## 2023-06-14 DIAGNOSIS — R1013 Epigastric pain: Secondary | ICD-10-CM

## 2023-06-14 DIAGNOSIS — K638219 Small intestinal bacterial overgrowth, unspecified: Secondary | ICD-10-CM | POA: Diagnosis not present

## 2023-06-14 DIAGNOSIS — K580 Irritable bowel syndrome with diarrhea: Secondary | ICD-10-CM

## 2023-06-14 LAB — CBC WITH DIFFERENTIAL/PLATELET
Basophils Absolute: 0 10*3/uL (ref 0.0–0.1)
Basophils Relative: 0.5 % (ref 0.0–3.0)
Eosinophils Absolute: 0.2 10*3/uL (ref 0.0–0.7)
Eosinophils Relative: 2.2 % (ref 0.0–5.0)
HCT: 41.6 % (ref 36.0–46.0)
Hemoglobin: 14.1 g/dL (ref 12.0–15.0)
Lymphocytes Relative: 28.9 % (ref 12.0–46.0)
Lymphs Abs: 2.4 10*3/uL (ref 0.7–4.0)
MCHC: 34 g/dL (ref 30.0–36.0)
MCV: 98.5 fl (ref 78.0–100.0)
Monocytes Absolute: 0.7 10*3/uL (ref 0.1–1.0)
Monocytes Relative: 8.6 % (ref 3.0–12.0)
Neutro Abs: 4.9 10*3/uL (ref 1.4–7.7)
Neutrophils Relative %: 59.8 % (ref 43.0–77.0)
Platelets: 332 10*3/uL (ref 150.0–400.0)
RBC: 4.22 Mil/uL (ref 3.87–5.11)
RDW: 13.3 % (ref 11.5–15.5)
WBC: 8.2 10*3/uL (ref 4.0–10.5)

## 2023-06-14 LAB — COMPREHENSIVE METABOLIC PANEL
ALT: 15 U/L (ref 0–35)
AST: 19 U/L (ref 0–37)
Albumin: 4.1 g/dL (ref 3.5–5.2)
Alkaline Phosphatase: 112 U/L (ref 39–117)
BUN: 18 mg/dL (ref 6–23)
CO2: 25 mEq/L (ref 19–32)
Calcium: 9.9 mg/dL (ref 8.4–10.5)
Chloride: 104 mEq/L (ref 96–112)
Creatinine, Ser: 1.03 mg/dL (ref 0.40–1.20)
GFR: 52.79 mL/min — ABNORMAL LOW (ref >60.00)
Glucose, Bld: 98 mg/dL (ref 70–99)
Potassium: 4.4 mEq/L (ref 3.5–5.1)
Sodium: 138 mEq/L (ref 135–145)
Total Bilirubin: 0.7 mg/dL (ref 0.2–1.2)
Total Protein: 7.3 g/dL (ref 6.0–8.3)

## 2023-06-14 MED ORDER — ZENPEP 20000-63000 UNITS PO CPEP: 1.0000 | ORAL_CAPSULE | Freq: Three times a day (TID) | ORAL | 3 refills | Status: DC

## 2023-06-14 MED ORDER — ZENPEP 20000-63000 UNITS PO CPEP: ORAL_CAPSULE | ORAL | 3 refills | Status: DC

## 2023-06-14 MED ORDER — ZENPEP 40000-126000 UNITS PO CPEP: 40000.0000 [IU] | ORAL_CAPSULE | Freq: Three times a day (TID) | ORAL | 3 refills | Status: DC

## 2023-06-14 NOTE — Telephone Encounter (Signed)
Inbound call from patient's pharmacy. Stated two orders for the same medication was sent in. Requesting a call back to be advised on the correct medication and instructions. Please advise, thank you.

## 2023-06-14 NOTE — Progress Notes (Unsigned)
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 IBS-D  Plan:  - Zenpep 40K with each meal, add 20K with each snack -Continue cholestramaine 4g every day -Continue imodium at bedtime. -Call in 2 weeks. If still with problems, lomitil  -Check CBC, CMP.   HPI:    Meghan Tucker is a 77 y.o. female  For FU  Better  Wt Readings from Last 3 Encounters:  06/14/23 131 lb 9.6 oz (59.7 kg)  11/25/22 139 lb (63 kg)  09/27/22 134 lb (60.8 kg)     Seen by Reynaldo Minium.  SIBO breath test-highly suggestive of small bowel bacterial overgrowth.  She has postprandial abdominal bloating and intermittent postprandial diarrhea without nocturnal symptoms.  Abdo pain/discomfort 1 hour after eating.  Better with Align. Some nausea but no vomiting.  Extensive GI evaluation as below.  Occ heartburn, better with as needed omeprazole.  No odynophagia or dysphagia.  No wt loss  Wt Readings from Last 3 Encounters:  06/14/23 131 lb 9.6 oz (59.7 kg)  11/25/22 139 lb (63 kg)  09/27/22 134 lb (60.8 kg)          Previous GI Evaluation    Celiac serologies negative.   CT AP 10/2020 with contrast: Negative.   May 2016 EGD for dysphagia -ENDOSCOPIC IMPRESSION: 1. mild distal esophageal stricture status post dilation from 14-16 mm 2. Small reducible hiatal hernia 3. Mild-to-moderate antral gastritis. Status post biopsies to rule out H. pylor   EGD 06/09/2017: Hiatal hernia (Dr Kinnie Scales)   Colonoscopy 06/09/2017 - Sigmoid colonic diverticulosis, internal hemorrhoids.  Negative random colonic biopsies.   Ba swallow 02/01/2019: Small HH, 2 episodes of reflux. Ba tab passed without any difficulty      Past Medical History:  Diagnosis Date   Adrenal insufficiency (HCC)    Arthritis    "all over"   Chronic back pain    2 buldging disc,stenosis   Diverticulosis    Essential hypertension     takes Metoprolol daily   GERD (gastroesophageal reflux disease)    takes Omeprazole daily   History of hiatal hernia    History of kidney stones    History of kidney stones    History of migraine    none since menopause yrs ago   Hyperlipidemia    takes Fish Oil daily   Hypertension    takes Metoprolol daily   Insomnia    takes Ambien nightly   Sciatica    Sciatica    Thyroid disease    Tingling    right arm.Both feet    Tremor     Past Surgical History:  Procedure Laterality Date   CARPAL TUNNEL RELEASE Left    cataract surgery Bilateral    COLONOSCOPY     Dr. Judie Petit in [place]   ESOPHAGOGASTRODUODENOSCOPY     Dr Judie Petit in [place]   ESOPHAGOGASTRODUODENOSCOPY (EGD) WITH ESOPHAGEAL DILATION  05/14/2015   with Propofol   ESOPHAGOGASTRODUODENOSCOPY (EGD) WITH PROPOFOL  10/19/2013   GANGLION CYST EXCISION     HAMMERTOE RECONSTRUCTION WITH WEIL OSTEOTOMY Left 07/17/2015   Procedure: LEFT 2-3 WEIL OSTEOTOMY AND HAMMERTOE CORRECTION ;  Surgeon: Toni Arthurs, MD;  Location: [facility];  Service: Orthopedics;  Laterality: Left;   HAND SURGERY Bilateral     Family History  Problem Relation Age of Onset   Heart disease Father  Heart attack Father 41   Stroke Mother 59   Colon cancer Neg Hx    Esophageal cancer Neg Hx    Cancer Neg Hx     Social History   Tobacco Use   Smoking status: Never   Smokeless tobacco: Never  Vaping Use   Vaping Use: Never used  Substance Use Topics   Alcohol use: Yes    Comment: 1-2 glasses champagne nightly   Drug use: Never    Current Outpatient Medications  Medication Sig Dispense Refill   acebutolol (SECTRAL) 200 MG capsule Take 1 capsule by mouth daily.     buPROPion (WELLBUTRIN XL) 150 MG 24 hr tablet Take 150 mg by mouth every morning.     CALCIUM CITRATE PO Take 1 tablet by mouth every other day.     cholestyramine (QUESTRAN) 4 GM/DOSE powder Take daily in apple sauce or beverage 378 g 1   citalopram (CELEXA) 10 MG  tablet Take 10 mg by mouth daily.     dicyclomine (BENTYL) 10 MG capsule Take 1 capsule (10 mg total) by mouth 2 (two) times daily. 60 capsule 3   ezetimibe (ZETIA) 10 MG tablet Take 10 mg by mouth daily.     furosemide (LASIX) 20 MG tablet Take 10 mg by mouth every other day.     hydrOXYzine (ATARAX/VISTARIL) 10 MG tablet Take 10 mg by mouth 2 (two) times daily.     Inulin (FIBER CHOICE PO) Take by mouth.     NON FORMULARY Metamucil 1 a day     Pancrelipase, Lip-Prot-Amyl, (ZENPEP) 40000-126000 units CPEP Take 1 capsule (40,000 Units total) by mouth with breakfast, with lunch, and with evening meal. 100 capsule 3   traZODone (DESYREL) 50 MG tablet Take 50 mg by mouth at bedtime.     Apoaequorin (PREVAGEN PO) Take 1 tablet by mouth daily. Prevagen (Patient not taking: Reported on 11/25/2022)     zolpidem (AMBIEN) 5 MG tablet TAKE 1/2 TABLET AT NIGHT FOR SLEEP AS NEEDED. MAY TAKE AN EXTRA 1/2 TABLET IF NEEDED. (Patient not taking: Reported on 06/14/2023)     No current facility-administered medications for this visit.    Allergies  Allergen Reactions   Atorvastatin Other (See Comments)    MYALGIAS MYALGIAS Other reaction(s): Myalgias (intolerance), Other, Other (See Comments) MYALGIAS MYALGIAS MYALGIAS   Rosuvastatin Other (See Comments)    MYALGIAS MYALGIAS Other reaction(s): Myalgias (intolerance), Other, Other (See Comments) MYALGIAS MYALGIAS MYALGIAS    Review of Systems:  neg     Physical Exam:    BP 118/66   Pulse 95   Ht 5' (1.524 m)   Wt 131 lb 9.6 oz (59.7 kg)   SpO2 96%   BMI 25.70 kg/m  Wt Readings from Last 3 Encounters:  06/14/23 131 lb 9.6 oz (59.7 kg)  11/25/22 139 lb (63 kg)  09/27/22 134 lb (60.8 kg)   Constitutional:  Well-developed, in no acute distress. Psychiatric: Normal mood and affect. Behavior is normal. HEENT: Pupils normal.  Conjunctivae are normal. No scleral icterus. Cardiovascular: Normal rate, regular rhythm. No  edema Pulmonary/chest: Effort normal and breath sounds normal. No wheezing, rales or rhonchi. Abdominal: Soft, nondistended. Nontender. Bowel sounds active throughout. There are no masses palpable. No hepatomegaly. Rectal: Deferred Neurological: Alert and oriented to person place and time. Skin: Skin is warm and dry. No rashes noted.  Data Reviewed: I have personally reviewed following labs and imaging studies  CBC:    Latest Ref Rng & Units 11/25/2022  9:35 AM 08/17/2022    4:22 PM 10/02/2021   12:00 AM  CBC  WBC 4.0 - 10.5 K/uL 8.9  7.1  9.5      Hemoglobin 12.0 - 15.0 g/dL 16.1  09.6  04.5      Hematocrit 36.0 - 46.0 % 41.1  40.5  36      Platelets 150.0 - 400.0 K/uL 297.0  313  302         This result is from an external source.    CMP:    Latest Ref Rng & Units 11/25/2022    9:35 AM 08/17/2022    4:22 PM 10/02/2021   12:00 AM  CMP  Glucose 70 - 99 mg/dL 409  88    BUN 6 - 23 mg/dL 22  17  33      Creatinine 0.40 - 1.20 mg/dL 8.11  9.14  1.3      Sodium 135 - 145 mEq/L 136  141  138      Potassium 3.5 - 5.1 mEq/L 4.0  5.0  4.6      Chloride 96 - 112 mEq/L 100  102  101      CO2 19 - 32 mEq/L 26  25  30       Calcium 8.4 - 10.5 mg/dL 9.4  9.8  9.0      Total Protein 6.0 - 8.3 g/dL 6.8  6.6    Total Bilirubin 0.2 - 1.2 mg/dL 0.5  0.4    Alkaline Phos 39 - 117 U/L 59  63  86      AST 0 - 37 U/L 18  28  33      ALT 0 - 35 U/L 16  24  30          This result is from an external source.        Edman Circle, MD 06/14/2023, 9:26 AM  Cc: Simone Curia, MD

## 2023-06-14 NOTE — Telephone Encounter (Signed)
Spoke to pharmacy regarding the 2 different ones I sent

## 2023-06-14 NOTE — Patient Instructions (Addendum)
_______________________________________________________  If your blood pressure at your visit was 140/90 or greater, please contact your primary care physician to follow up on this.  _______________________________________________________  If you are age 77 or older, your body mass index should be between 23-30. Your Body mass index is 25.7 kg/m. If this is out of the aforementioned range listed, please consider follow up with your Primary Care Provider.  If you are age 60 or younger, your body mass index should be between 19-25. Your Body mass index is 25.7 kg/m. If this is out of the aformentioned range listed, please consider follow up with your Primary Care Provider.   ________________________________________________________  The [facility] providers would like to encourage you to use [facility] to communicate with providers for non-urgent requests or questions.  Due to long hold times on the telephone, sending your provider a message by [facility] may be a faster and more efficient way to get a response.  Please allow 48 business hours for a response.  Please remember that this is for non-urgent requests.  _______________________________________________________  Your provider has requested that you go to the basement level for lab work before leaving today. Press "B" on the elevator. The lab is located at the first door on the left as you exit the elevator.  We have sent the following medications to your pharmacy for you to pick up at your convenience: Zenpep   Continue imodium at night and Questran (take 2 hours before or after all other medications) daily.  Please call Dr. Donne Hazel nurse in 2 weeks at [phone]  to let her now how you are doing.   Thank you,  Dr. Lynann Bologna

## 2023-06-15 ENCOUNTER — Ambulatory Visit: Payer: Medicare Other | Admitting: Gastroenterology

## 2023-07-06 ENCOUNTER — Ambulatory Visit: Payer: Medicare Other | Admitting: Gastroenterology

## 2023-07-18 NOTE — Telephone Encounter (Signed)
Patient called stated she is not getting any better and would like to schedule a colonoscopy please advise on scheduling.

## 2023-07-20 NOTE — Telephone Encounter (Signed)
Left message for pt to call back  °

## 2023-07-21 NOTE — Telephone Encounter (Signed)
Spoke with patient & she says her symptoms have not improved. She would like to schedule colonoscopy. Per OV note, it looks like this was discussed & as well as potential medication trial. Will route to MD to advise. Last seen with Dr. Chales Abrahams on 06/14/23.

## 2023-07-22 NOTE — Telephone Encounter (Signed)
Left message for patient to call back  

## 2023-07-22 NOTE — Telephone Encounter (Signed)
increase cholestyramine 4 g p.o. twice daily.  To be taken at least an hour before or after rest of the medications  proceed with colonoscopy with MiraLAX prep  RG

## 2023-07-25 ENCOUNTER — Other Ambulatory Visit: Payer: Self-pay

## 2023-07-25 DIAGNOSIS — R109 Unspecified abdominal pain: Secondary | ICD-10-CM

## 2023-07-25 DIAGNOSIS — R197 Diarrhea, unspecified: Secondary | ICD-10-CM

## 2023-07-25 NOTE — Telephone Encounter (Signed)
Pt was made aware of Dr Chales Abrahams recommendations: increase cholestyramine 4 g p.o. twice daily.  To be taken at least an hour before or after rest of the medications  proceed with colonoscopy with MiraLAX prep. Pt was scheduled for an colonoscopy on 09/07/2023 at 3:00 PM with Dr. Chales Abrahams. Pt made aware.  Ambulatory referral to GI placed in Epic. Pt made aware. Prep instructions were sent to pt via my chart and via mail. Pt made aware.  Pt verbalized understanding with all questions answered.

## 2023-09-07 ENCOUNTER — Encounter: Payer: Medicare Other | Admitting: Gastroenterology

## 2024-02-07 ENCOUNTER — Other Ambulatory Visit (HOSPITAL_BASED_OUTPATIENT_CLINIC_OR_DEPARTMENT_OTHER): Payer: Self-pay

## 2024-02-07 ENCOUNTER — Ambulatory Visit (HOSPITAL_BASED_OUTPATIENT_CLINIC_OR_DEPARTMENT_OTHER)
Admission: EM | Admit: 2024-02-07 | Discharge: 2024-02-07 | Disposition: A | Discharge status: Home / Self Care | Payer: Medicare Other

## 2024-02-07 ENCOUNTER — Encounter (HOSPITAL_BASED_OUTPATIENT_CLINIC_OR_DEPARTMENT_OTHER): Payer: Self-pay

## 2024-02-07 DIAGNOSIS — J111 Influenza due to unidentified influenza virus with other respiratory manifestations: Secondary | ICD-10-CM

## 2024-02-07 LAB — POC COVID19/FLU A&B COMBO
Covid Antigen, POC: NEGATIVE
Influenza A Antigen, POC: NEGATIVE
Influenza B Antigen, POC: NEGATIVE

## 2024-02-07 MED ORDER — ONDANSETRON 4 MG PO TBDP
4.0000 mg | ORAL_TABLET | Freq: Once | ORAL | Status: AC
  Administered 2024-02-07: 4 mg via ORAL

## 2024-02-07 MED ORDER — ONDANSETRON HCL 4 MG PO TABS
4.0000 mg | ORAL_TABLET | Freq: Three times a day (TID) | ORAL | 0 refills | Status: DC | PRN
  Filled 2024-02-07: qty 20, 7d supply, fill #0

## 2024-02-07 NOTE — ED Triage Notes (Signed)
Cough started Sunday afternoon. Today woke up with body aches, headache, N/V, runny nose.Patient has not had her blood pressure medication in 2 days. Is on day #2 of tamiflu that was prescribed via telehealth.

## 2024-02-07 NOTE — ED Provider Notes (Signed)
[facility]    CSN: [id number] Arrival date & time: 02/07/24  1516      History   Chief Complaint Chief Complaint  Patient presents with   Cough   Headache   Emesis   Nausea    HPI Meghan Tucker is a 78 y.o. female.  Patient here with spouse. Both are concern that patient is still experiencing flu symptoms after starting Tamiflu 2 days ago. Patient is now with nausea and vomiting and endorse poor food intake for 1 day, although able to tolerate fluids. Reports body aches, fever, and mild cough. Denies SOB or chest pain. Reports fu was diagnosed via virtual visit wants to be retested.  Past Medical History:  Diagnosis Date   Adrenal insufficiency (HCC)    Arthritis    "all over"   Chronic back pain    2 buldging disc,stenosis   Diverticulosis    Essential hypertension    takes Metoprolol daily   GERD (gastroesophageal reflux disease)    takes Omeprazole daily   History of hiatal hernia    History of kidney stones    History of kidney stones    History of migraine    none since menopause yrs ago   Hyperlipidemia    takes Fish Oil daily   Hypertension    takes Metoprolol daily   Insomnia    takes Ambien nightly   Sciatica    Sciatica    Thyroid disease    Tingling    right arm.Both feet    Tremor     Patient Active Problem List   Diagnosis Date Noted   Chronic fatigue 09/24/2022   Excessive daytime sleepiness 09/24/2022   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   RLS (restless legs syndrome) 08/17/2022   Ecchymoses, spontaneous 10/15/2021   Hyperthyroidism 04/17/2021   Tremor 02/26/2021   Gait abnormality 02/26/2021   Lumbar radiculopathy 02/26/2021   Abnormal thyroid function test 01/16/2021   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   Hypertension    Tingling    Sciatica    Insomnia    Essential hypertension    Hyperlipidemia    History of migraine    History of kidney stones    History of hiatal hernia     Diverticulosis    Chronic back pain    Arthritis    IBS (irritable bowel syndrome) 10/31/2020   Blepharitis of upper and lower eyelids of both eyes 07/11/2018   Dermatochalasis of both upper eyelids 04/04/2018   Bilateral posterior capsular opacification 05/03/2017   Dry eye 05/03/2017   Presbyopia of both eyes 05/03/2017   Pseudophakia of both eyes 05/03/2017   Corn of toe 01/19/2017   Hammertoe of left foot 01/19/2017   Spondylolisthesis of lumbar region 10/26/2016   Myalgia 08/23/2014   Asthma 08/22/2014   CTS (carpal tunnel syndrome) 08/22/2014   Migraines 08/22/2014   Morton's neuroma 08/22/2014   Osteopenia 08/22/2014   Shortness of breath 08/22/2014   Vitamin D deficiency 08/22/2014   Nausea alone 10/12/2013   GERD (gastroesophageal reflux disease) 10/12/2013    Past Surgical History:  Procedure Laterality Date   CARPAL TUNNEL RELEASE Left    cataract surgery Bilateral    COLONOSCOPY     Dr. Judie Petit in [place]   ESOPHAGOGASTRODUODENOSCOPY     Dr Judie Petit in [place]   ESOPHAGOGASTRODUODENOSCOPY (EGD) WITH ESOPHAGEAL DILATION  05/14/2015   with Propofol   ESOPHAGOGASTRODUODENOSCOPY (EGD) WITH PROPOFOL  10/19/2013   GANGLION CYST EXCISION  HAMMERTOE RECONSTRUCTION WITH WEIL OSTEOTOMY Left 07/17/2015   Procedure: LEFT 2-3 WEIL OSTEOTOMY AND HAMMERTOE CORRECTION ;  Surgeon: Toni Arthurs, MD;  Location: [facility];  Service: Orthopedics;  Laterality: Left;   HAND SURGERY Bilateral     OB History   No obstetric history on file.      Home Medications    Prior to Admission medications   Medication Sig Start Date End Date Taking? Authorizing Provider  amLODipine (NORVASC) 5 MG tablet Take 5 mg by mouth daily.   Yes [provider]  denosumab (PROLIA) 60 MG/ML SOSY injection Inject 60 mg into the skin every 6 (six) months.   Yes [provider]  lisinopril (ZESTRIL) 5 MG tablet Take 5 mg by mouth daily.   Yes [provider]   ondansetron (ZOFRAN) 4 MG tablet Take 1 tablet (4 mg total) by mouth every 8 (eight) hours as needed for nausea or vomiting. 02/07/24  Yes Bing Neighbors, NP  acebutolol (SECTRAL) 200 MG capsule Take 1 capsule by mouth daily. 08/23/22   [provider]  Apoaequorin (PREVAGEN PO) Take 1 tablet by mouth daily. Prevagen Patient not taking: Reported on 11/25/2022    [provider]  buPROPion (WELLBUTRIN XL) 150 MG 24 hr tablet Take 150 mg by mouth every morning. 08/16/22   [provider]  CALCIUM CITRATE PO Take 1 tablet by mouth every other day.    [provider]  cholestyramine Lanetta Inch) 4 GM/DOSE powder Take daily in apple sauce or beverage 04/25/23   Lynann Bologna, MD  citalopram (CELEXA) 10 MG tablet Take 10 mg by mouth daily.    [provider]  dicyclomine (BENTYL) 10 MG capsule Take 1 capsule (10 mg total) by mouth 2 (two) times daily. 10/31/20   Esterwood, Amy S, PA-C  ezetimibe (ZETIA) 10 MG tablet Take 10 mg by mouth daily. 08/16/22   [provider]  furosemide (LASIX) 20 MG tablet Take 10 mg by mouth every other day.    [provider]  hydrOXYzine (ATARAX/VISTARIL) 10 MG tablet Take 10 mg by mouth 2 (two) times daily.    [provider]  Inulin (FIBER CHOICE PO) Take by mouth.    [provider]  NON FORMULARY Metamucil 1 a day    [provider]  Pancrelipase, Lip-Prot-Amyl, (ZENPEP) 20000-63000 units CPEP Take 3 times a day with snack 06/14/23   Lynann Bologna, MD  Pancrelipase, Lip-Prot-Amyl, (ZENPEP) 40000-126000 units CPEP Take 1 capsule (40,000 Units total) by mouth with breakfast, with lunch, and with evening meal. 06/14/23   Lynann Bologna, MD  traZODone (DESYREL) 50 MG tablet Take 50 mg by mouth at bedtime. 08/16/22   [provider]  zolpidem (AMBIEN) 5 MG tablet TAKE 1/2 TABLET AT NIGHT FOR SLEEP AS NEEDED. MAY TAKE AN EXTRA 1/2 TABLET IF NEEDED. Patient not taking: Reported on  06/14/2023 11/14/22   [provider]    Family History Family History  Problem Relation Age of Onset   Heart disease Father    Heart attack Father 77   Stroke Mother 70   Colon cancer Neg Hx    Esophageal cancer Neg Hx    Cancer Neg Hx     Social History Social History   Tobacco Use   Smoking status: Never   Smokeless tobacco: Never  Vaping Use   Vaping status: Never Used  Substance Use Topics   Alcohol use: Yes    Comment: 1-2 glasses champagne nightly  Drug use: Never     Allergies   Atorvastatin, Rosuvastatin, and Prednisone   Review of Systems Review of Systems  Respiratory:  Positive for cough.   Gastrointestinal:  Positive for vomiting.  Neurological:  Positive for headaches.     Physical Exam Triage Vital Signs ED Triage Vitals  Encounter Vitals Group     BP 02/07/24 1648 (!) 156/103     Systolic BP Percentile --      Diastolic BP Percentile --      Pulse Rate 02/07/24 1648 76     Resp 02/07/24 1648 20     Temp 02/07/24 1648 99 F (37.2 C)     Temp Source 02/07/24 1648 Oral     SpO2 02/07/24 1648 96 %     Weight 02/07/24 1651 125 lb (56.7 kg)     Height --      Head Circumference --      Peak Flow --      Pain Score 02/07/24 1651 5     Pain Loc --      Pain Education --      Exclude from Growth Chart --    No data found.  Updated Vital Signs BP (!) 156/103 (BP Location: Right Arm)   Pulse 76   Temp 99 F (37.2 C) (Oral)   Resp 20   Wt 125 lb (56.7 kg)   SpO2 96%   BMI 24.41 kg/m   Visual Acuity Right Eye Distance:   Left Eye Distance:   Bilateral Distance:    Right Eye Near:   Left Eye Near:    Bilateral Near:     Physical Exam Vitals reviewed.  Constitutional:      General: She is not in acute distress.    Appearance: She is ill-appearing. She is not toxic-appearing.  HENT:     Head: Normocephalic and atraumatic.     Nose: Congestion and rhinorrhea present.     Mouth/Throat:     Mouth: Mucous membranes are  moist.  Eyes:     Extraocular Movements: Extraocular movements intact.     Pupils: Pupils are equal, round, and reactive to light.  Cardiovascular:     Rate and Rhythm: Normal rate and regular rhythm.  Pulmonary:     Effort: Pulmonary effort is normal.     Breath sounds: Normal breath sounds.  Abdominal:     General: Abdomen is flat. There is no distension.     Tenderness: There is no abdominal tenderness.  Musculoskeletal:        General: Normal range of motion.     Cervical back: Normal range of motion and neck supple.  Lymphadenopathy:     Cervical: No cervical adenopathy.  Skin:    General: Skin is warm and dry.  Neurological:     General: No focal deficit present.      UC Treatments / Results  Labs (all labs ordered are listed, but only abnormal results are displayed) Labs Reviewed  POC COVID19/FLU A&B COMBO - Normal    EKG   Radiology No results found.  Procedures Procedures (including critical care time)  Medications Ordered in UC Medications  ondansetron (ZOFRAN-ODT) disintegrating tablet 4 mg (4 mg Oral Given 02/07/24 1740)    Initial Impression / Assessment and Plan / UC Course  I have reviewed the triage vital signs and the nursing notes.  Pertinent labs & imaging results that were available during my care of the patient were reviewed by me and considered in  my medical decision making (see chart for details).    Treating you for an influenza-like illness. Negative POC test for both flu and covid. Symptoms are consistent with current Influenza like illness, encouraged to continue and complete Tamiflu. Zofran prescribed for nausea.  Recommended Tylenol for fever. Force fluids.ED precautions given if any RED Flag symptoms develop. Final diagnoses:  Influenza-like illness     Discharge Instructions      Continue Tamiflu.  Zofran prescribed for you to take every 6-8 hours as needed for nausea.  Encouraged to drink protein shake or eat bland starchy  food this will help with nausea symptoms associated with flu and taking medication on an empty symptoms.      ED Prescriptions     Medication Sig Dispense Auth. Provider   ondansetron (ZOFRAN) 4 MG tablet Take 1 tablet (4 mg total) by mouth every 8 (eight) hours as needed for nausea or vomiting. 20 tablet Bing Neighbors, NP      PDMP not reviewed this encounter.   Bing Neighbors, NP 02/10/24 [id number]

## 2024-02-07 NOTE — Discharge Instructions (Addendum)
Continue Tamiflu.  Zofran prescribed for you to take every 6-8 hours as needed for nausea.  Encouraged to drink protein shake or eat bland starchy food this will help with nausea symptoms associated with flu and taking medication on an empty symptoms.

## 2024-04-06 ENCOUNTER — Encounter: Payer: Self-pay | Admitting: *Deleted

## 2024-04-06 ENCOUNTER — Encounter: Payer: Self-pay | Admitting: Cardiology

## 2024-04-06 DIAGNOSIS — G4733 Obstructive sleep apnea (adult) (pediatric): Secondary | ICD-10-CM | POA: Insufficient documentation

## 2024-04-06 DIAGNOSIS — R768 Other specified abnormal immunological findings in serum: Secondary | ICD-10-CM | POA: Insufficient documentation

## 2024-04-06 DIAGNOSIS — D692 Other nonthrombocytopenic purpura: Secondary | ICD-10-CM | POA: Insufficient documentation

## 2024-04-06 DIAGNOSIS — F988 Other specified behavioral and emotional disorders with onset usually occurring in childhood and adolescence: Secondary | ICD-10-CM | POA: Insufficient documentation

## 2024-04-06 DIAGNOSIS — F3341 Major depressive disorder, recurrent, in partial remission: Secondary | ICD-10-CM | POA: Insufficient documentation

## 2024-04-06 DIAGNOSIS — E2740 Unspecified adrenocortical insufficiency: Secondary | ICD-10-CM | POA: Insufficient documentation

## 2024-04-12 ENCOUNTER — Ambulatory Visit: Admitting: Cardiology

## 2024-04-23 ENCOUNTER — Ambulatory Visit: Admitting: Cardiology

## 2024-05-02 ENCOUNTER — Ambulatory Visit: Attending: Cardiology | Admitting: Cardiology

## 2024-05-02 ENCOUNTER — Encounter: Payer: Self-pay | Admitting: Cardiology

## 2024-05-02 VITALS — BP 110/70 | HR 74 | Ht 60.0 in | Wt 131.2 lb

## 2024-05-02 DIAGNOSIS — R0789 Other chest pain: Secondary | ICD-10-CM | POA: Insufficient documentation

## 2024-05-02 DIAGNOSIS — R072 Precordial pain: Secondary | ICD-10-CM

## 2024-05-02 DIAGNOSIS — R768 Other specified abnormal immunological findings in serum: Secondary | ICD-10-CM | POA: Insufficient documentation

## 2024-05-02 DIAGNOSIS — E7800 Pure hypercholesterolemia, unspecified: Secondary | ICD-10-CM

## 2024-05-02 DIAGNOSIS — I10 Essential (primary) hypertension: Secondary | ICD-10-CM | POA: Diagnosis present

## 2024-05-02 DIAGNOSIS — R0602 Shortness of breath: Secondary | ICD-10-CM

## 2024-05-02 MED ORDER — METOPROLOL TARTRATE 100 MG PO TABS: ORAL_TABLET | ORAL | 0 refills | Status: AC

## 2024-05-02 NOTE — Patient Instructions (Addendum)
 Medication Instructions:  TAKE: Metoprolol  100mg  1 tablet 2 hours prior to CT scan   Lab Work: None Ordered If you have labs (blood work) drawn today and your tests are completely normal, you will receive your results only by: MyChart Message (if you have MyChart) OR A paper copy in the mail If you have any lab test that is abnormal or we need to change your treatment, we will call you to review the results.   Testing/Procedures:  Your physician has requested that you have an echocardiogram. Echocardiography is a painless test that uses sound waves to create images of your heart. It provides your doctor with information about the size and shape of your heart and how well your heart's chambers and valves are working. This procedure takes approximately one hour. There are no restrictions for this procedure. Please do NOT wear cologne, perfume, aftershave, or lotions (deodorant is allowed). Please arrive 15 minutes prior to your appointment time.  Please note: We ask at that you not bring children with you during ultrasound (echo/ vascular) testing. Due to room size and safety concerns, children are not allowed in the ultrasound rooms during exams. Our front office staff cannot provide observation of children in our lobby area while testing is being conducted. An adult accompanying a patient to their appointment will only be allowed in the ultrasound room at the discretion of the ultrasound technician under special circumstances. We apologize for any inconvenience.   Your cardiac CT will be scheduled at one of the below locations:   [facility] [address] [place], [place] [address]  Please follow these instructions carefully (unless otherwise directed):    On the Night Before the Test: Be sure to Drink plenty of water. Do not consume any caffeinated/decaffeinated beverages or chocolate 12 hours prior to your test. Do not take any antihistamines 12 hours prior to your  test.   On the Day of the Test: Drink plenty of water until 1 hour prior to the test. Do not eat any food 4 hours prior to the test. You may take your regular medications prior to the test.  Take metoprolol  (Lopressor ) two hours prior to test. FEMALES- please wear underwire-free bra if available, avoid dresses & tight clothing       After the Test: Drink plenty of water. After receiving IV contrast, you may experience a mild flushed feeling. This is normal. On occasion, you may experience a mild rash up to 24 hours after the test. This is not dangerous. If this occurs, you can take Benadryl 25 mg and increase your fluid intake. If you experience trouble breathing, this can be serious. If it is severe call 911 IMMEDIATELY. If it is mild, please call our office. If you take any of these medications: Glipizide/Metformin, Avandament, Glucavance, please do not take 48 hours after completing test unless otherwise instructed.  We will call to schedule your test 2-4 weeks out understanding that some insurance companies will need an authorization prior to the service being performed.   For non-scheduling related questions, please contact the cardiac imaging nurse navigator should you have any questions/concerns: Jinger Mount, Cardiac Imaging Nurse Navigator Chase Copping, Cardiac Imaging Nurse Navigator [facility] Direct Office Dial: [phone]   For scheduling needs, including cancellations and rescheduling, please call [place], [phone].    Follow-Up: At [facility], you and your health needs are our priority.  As part of our continuing mission to provide you with exceptional heart  care, we have created designated Provider Care Teams.  These Care Teams include your primary Cardiologist (physician) and Advanced Practice Providers (APPs -  Physician Assistants and Nurse Practitioners) who all work together to provide you with the care you need, when you  need it.  We recommend signing up for the patient portal called "MyChart".  Sign up information is provided on this After Visit Summary.  MyChart is used to connect with patients for Virtual Visits (Telemedicine).  Patients are able to view lab/test results, encounter notes, upcoming appointments, etc.  Non-urgent messages can be sent to your provider as well.   To learn more about what you can do with MyChart, go to [email or web address].    Your next appointment:   2 month(s)  The format for your next appointment:   In Person  Provider:   Ralene Burger, MD    Other Instructions NA

## 2024-05-02 NOTE — Progress Notes (Unsigned)
 Cardiology Consultation:    Date:  05/02/2024   ID:  Meghan Tucker, DOB 14-May-1946, MRN [id number]  PCP:  Angelique Barer, MD  Cardiologist:  Ralene Burger, MD   Referring MD: Angelique Barer, MD   Chief Complaint  Patient presents with   Follow-up    History of Present Illness:    Meghan Tucker is a 78 y.o. female who is being seen today for the evaluation of chest pain at the request of Angelique Barer, MD. sweet lady with past medical history significant for essential hypertension, dyslipidemia, GERD she was referred to us  because of chest pain.  She described pain located retrosternally and described very complex characteristics sometimes burning sometimes pressure sometimes sharp.  She was given proton pump inhibitor with quite good response.  Since that time she has no symptoms.  She said she is very active she does some water aerobic used to do yoga but does not do it anymore.  She is very energetic.  Denies have any typical exertional chest pain tightness squeezing pressure burning chest.  She smoked only for 3 weeks in her life.  Does have family history of premature coronary artery disease including her father who died at very early age because of massive heart attack.  He was a heavy smoker.  Past Medical History:  Diagnosis Date   ADD (attention deficit disorder)    Adrenal insufficiency (HCC)    Allergic rhinitis    Arthritis    "all over"   B12 deficiency    Chronic back pain    2 buldging disc,stenosis   Diverticulosis    DOE (dyspnea on exertion)    Essential hypertension    takes Metoprolol  daily   GERD (gastroesophageal reflux disease)    takes Omeprazole  daily   History of hiatal hernia    History of kidney stones    History of migraine    none since menopause yrs ago   Hypercholesterolemia 08/23/2014   Hyperlipidemia    takes Fish Oil daily   Hypertension    takes Metoprolol  daily   Hyperthyroidism 04/17/2021   Hypokalemia    IBS (irritable bowel syndrome)  10/31/2020   Insomnia    takes Ambien  nightly   Major depressive disorder, recurrent episode, in partial remission (HCC)    OSA (obstructive sleep apnea)    Osteoarthritis 08/22/2014   Pseudophakia of both eyes 05/03/2017   RLS (restless legs syndrome) 08/17/2022   Sciatica    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 Melven Stable in [place]   ESOPHAGOGASTRODUODENOSCOPY     Dr Melven Stable in [place]   ESOPHAGOGASTRODUODENOSCOPY (EGD) WITH ESOPHAGEAL DILATION  05/14/2015   with Propofol    ESOPHAGOGASTRODUODENOSCOPY (EGD) WITH PROPOFOL   10/19/2013   GANGLION CYST EXCISION     GANGLION CYST EXCISION     HAMMERTOE RECONSTRUCTION WITH WEIL OSTEOTOMY Left 07/17/2015   Procedure: LEFT 2-3 WEIL OSTEOTOMY AND HAMMERTOE CORRECTION ;  Surgeon: Amada Backer, MD;  Location: [facility];  Service: Orthopedics;  Laterality: Left;   HAND SURGERY Bilateral  Current Medications: Current Meds  Medication Sig   amLODipine (NORVASC) 5 MG tablet Take 5 mg by mouth daily.   Apoaequorin (PREVAGEN PO) Take 1 tablet by mouth daily.   buPROPion (WELLBUTRIN XL) 300 MG 24 hr tablet Take 300 mg by mouth daily.   cholestyramine  (QUESTRAN ) 4 g packet Take 4 g by mouth daily.   citalopram (CELEXA) 10 MG tablet Take 10 mg by mouth daily.   denosumab  (PROLIA ) 60 MG/ML SOSY injection Inject 60 mg into the skin every 6 (six) months.   ezetimibe (ZETIA) 10 MG tablet Take 10 mg by mouth daily.   lisinopril (ZESTRIL) 5 MG tablet Take 5 mg by mouth daily.   oxyCODONE -acetaminophen  (PERCOCET/ROXICET) 5-325 MG tablet Take 1  tablet by mouth every 4 (four) hours as needed for severe pain (pain score 7-10).   traZODone (DESYREL) 50 MG tablet Take 50 mg by mouth at bedtime as needed for sleep.   [DISCONTINUED] calcium  carbonate (OSCAL) 1500 (600 Ca) MG TABS tablet Take 1,500 mg by mouth daily with breakfast.   [DISCONTINUED] cetirizine (ZYRTEC) 10 MG tablet Take 10 mg by mouth daily as needed for allergies.   [DISCONTINUED] furosemide (LASIX) 20 MG tablet Take 20 mg by mouth 2 (two) times daily.   [DISCONTINUED] meloxicam  (MOBIC ) 7.5 MG tablet Take 7.5 mg by mouth daily.   [DISCONTINUED] pantoprazole  (PROTONIX ) 40 MG tablet Take 40 mg by mouth 2 (two) times daily.   [DISCONTINUED] Probiotic Product (ALIGN PO) Take 4 mg by mouth daily.   [DISCONTINUED] zolpidem  (AMBIEN ) 5 MG tablet Take 5 mg by mouth at bedtime as needed for sleep.     Allergies:   Atorvastatin, Rosuvastatin , Corticosteroids, and Prednisone   Social History   Socioeconomic History   Marital status: Married    Spouse name: Jb   Number of children: 2   Years of education: two years of college   Highest education level: Not on file  Occupational History   Occupation: Retired  Tobacco Use   Smoking status: Never   Smokeless tobacco: Never  Vaping Use   Vaping status: Never Used  Substance and Sexual Activity   Alcohol  use: Yes    Comment: 1-2 glasses champagne nightly   Drug use: Never   Sexual activity: Not on file  Other Topics Concern   Not on file  Social History Narrative   Lives at home with her husband.   Left-handed.   Small can of Red Bull daily (does not always finish).   Social Drivers of Corporate investment banker Strain: Not on file  Food Insecurity: No Food Insecurity (01/31/2023)   Received [facility], [facility]   Hunger Vital Sign    Worried About Running Out of Food in the Last Year: Never true    Ran Out of Food in the Last Year: Never true  Transportation Needs: No Transportation Needs (01/31/2023)    Received from [facility], [facility]   [facility] - Transportation    Lack of Transportation (Medical): No    Lack of Transportation (Non-Medical): No  Physical Activity: Not on file  Stress: Not on file  Social Connections: Not on file     Family History: The patient's family history includes Cerebrovascular Accident in her paternal grandmother; Heart attack (age of onset: 43) in her father; Heart disease in her father, maternal grandfather, mother, paternal grandfather, and paternal grandmother; Hypertension in her brother and father; Stroke (age of onset: 92) in her mother.  There is no history of Colon cancer, Esophageal cancer, or Cancer. ROS:   Please see the history of present illness.    All 14 point review of systems negative except as described per history of present illness.  EKGs/Labs/Other Studies Reviewed:    The following studies were reviewed today:   EKG:  EKG Interpretation Date/Time:  Wednesday May 02 2024 11:00:40 EDT Ventricular Rate:  87 PR Interval:  144 QRS Duration:  84 QT Interval:  360 QTC Calculation: 433 R Axis:   -15  Text Interpretation: Normal sinus rhythm Possible Left atrial enlargement Low voltage QRS Cannot rule out Anterior infarct , age undetermined Abnormal ECG No previous ECGs available Confirmed by Ralene Burger [phone]) on 05/02/2024 11:04:50 AM    Recent Labs: 06/14/2023: ALT 15; BUN 18; Creatinine, Ser 1.03; Hemoglobin 14.1; Platelets 332.0; Potassium 4.4; Sodium 138  Recent Lipid Panel No results found for: "CHOL", "TRIG", "HDL", "CHOLHDL", "VLDL", "LDLCALC", "LDLDIRECT"  Physical Exam:    VS:  BP 110/70 (BP Location: Left Arm, Patient Position: Sitting)   Pulse 74   Ht 5' (1.524 m)   Wt 131 lb 3.2 oz (59.5 kg)   SpO2 97%   BMI 25.62 kg/m     Wt Readings from Last 3 Encounters:  05/02/24 131 lb 3.2 oz (59.5 kg)  03/29/24 128 lb (58.1 kg)  02/07/24 125 lb (56.7 kg)     GEN:  Well nourished, well developed in no  acute distress HEENT: Normal NECK: No JVD; No carotid bruits LYMPHATICS: No lymphadenopathy CARDIAC: RRR, no murmurs, no rubs, no gallops RESPIRATORY:  Clear to auscultation without rales, wheezing or rhonchi  ABDOMEN: Soft, non-tender, non-distended MUSCULOSKELETAL:  No edema; No deformity  SKIN: Warm and dry NEUROLOGIC:  Alert and oriented x 3 PSYCHIATRIC:  Normal affect   ASSESSMENT:    1. Essential hypertension   2. Atypical chest pain   3. Shortness of breath   4. Pure hypercholesterolemia    PLAN:    In order of problems listed above:  Chest pain with some atypical characteristic but because of risk factors for coronary disease I think it would be reasonable to do coronary CT angio to see how much coronary artery disease she does have.  Some of her symptoms are worrisome that is why we will pursue that investigation.  Part of her evaluation will include echocardiogram to assess left ventricle ejection fraction to answer the question why she does have dyspnea on exertion.  I still think overall she is in pretty good shape.  There was a precautionary measure to pursue the testing Dyslipidemia did review her K PN which show metoprolol  show 199 HDL 68, she is taking Zetia only.  Will await results of coronary CT angio calcium  score to determine how aggressive we need to be with management of this problem. Dyspnea on exertion again echocardiogram will be done   Medication Adjustments/Labs and Tests Ordered: Current medicines are reviewed at length with the patient today.  Concerns regarding medicines are outlined above.  Orders Placed This Encounter  Procedures   EKG 12-Lead   No orders of the defined types were placed in this encounter.    Signed, Manfred Seed, MD, [facility]. 05/02/2024 11:23 AM    [facility]

## 2024-05-23 ENCOUNTER — Ambulatory Visit: Attending: Cardiology

## 2024-05-23 DIAGNOSIS — R0602 Shortness of breath: Secondary | ICD-10-CM | POA: Insufficient documentation

## 2024-05-23 LAB — ECHOCARDIOGRAM COMPLETE
Area-P 1/2: 3.48 cm2
MV M vel: 4.94 m/s
MV Peak grad: 97.6 mmHg
S' Lateral: 2.4 cm

## 2024-05-24 ENCOUNTER — Ambulatory Visit: Payer: Self-pay | Admitting: Cardiology

## 2024-06-01 ENCOUNTER — Encounter (HOSPITAL_COMMUNITY): Payer: Self-pay

## 2024-06-04 ENCOUNTER — Telehealth (HOSPITAL_COMMUNITY): Payer: Self-pay | Admitting: Emergency Medicine

## 2024-06-04 NOTE — Telephone Encounter (Signed)
 Attempted to call patient regarding upcoming cardiac CT appointment. Left message on voicemail with name and callback number Rockwell Alexandria RN Navigator Cardiac Imaging [facility] [phone] Office [phone] Cell

## 2024-06-05 ENCOUNTER — Ambulatory Visit (HOSPITAL_BASED_OUTPATIENT_CLINIC_OR_DEPARTMENT_OTHER)
Admission: RE | Admit: 2024-06-05 | Discharge: 2024-06-05 | Disposition: A | Discharge status: Home / Self Care | Source: Ambulatory Visit | Attending: Cardiology | Admitting: Cardiology

## 2024-06-05 DIAGNOSIS — R072 Precordial pain: Secondary | ICD-10-CM | POA: Insufficient documentation

## 2024-06-05 MED ORDER — NITROGLYCERIN 0.4 MG SL SUBL
0.8000 mg | SUBLINGUAL_TABLET | Freq: Once | SUBLINGUAL | Status: AC
  Administered 2024-06-05: 0.8 mg via SUBLINGUAL

## 2024-06-05 MED ORDER — METOPROLOL TARTRATE 5 MG/5ML IV SOLN: 10.0000 mg | Freq: Once | INTRAVENOUS | Status: DC | PRN

## 2024-06-05 MED ORDER — IOHEXOL 350 MG/ML SOLN
100.0000 mL | Freq: Once | INTRAVENOUS | Status: AC | PRN
  Administered 2024-06-05: 95 mL via INTRAVENOUS

## 2024-06-05 MED ORDER — DILTIAZEM HCL 25 MG/5ML IV SOLN: 10.0000 mg | INTRAVENOUS | Status: DC | PRN

## 2024-06-25 ENCOUNTER — Telehealth: Payer: Self-pay

## 2024-06-25 NOTE — Telephone Encounter (Signed)
   Pre-operative Risk Assessment    Patient Name: Meghan Tucker  DOB: [birth year] MRN: [id number] Date of last office visit: 05/02/2024 Date of next office visit: 07/10/2024   Request for Surgical Clearance    Procedure:  Right Hallux IP Arthrodesis 2nd and 3rd Hammer toe correction Date of Surgery:  Clearance TBD                               Surgeon:  Dr. Norleen Armor Surgeon's Group or Practice Name:  [facility] Phone number:  [phone] Fax number:  [phone] Type of Clearance Requested:   - Medical  - Pharmacy:  Hold Not specified      Type of Anesthesia:  General    Additional requests/questions:    Signed, Chasty Randal M Jacia Sickman   06/25/2024, 3:22 PM

## 2024-06-25 NOTE — Telephone Encounter (Signed)
   Name: Lurline Caver  DOB: 10/29/1946  MRN: [id number]  Primary Cardiologist: None  Chart reviewed as part of pre-operative protocol coverage. The patient has an upcoming visit scheduled with Dr. Bernie on 07/10/2024 at which time clearance can be addressed in case there are any issues that would impact surgical recommendations.   I added preop FYI to appointment note so that provider is aware to address at time of outpatient visit.  Per office protocol the cardiology provider should forward their finalized clearance decision and recommendations regarding antiplatelet therapy to the requesting party below.    I will route this message as FYI to requesting party and remove this message from the preop box as separate preop APP input not needed at this time.   Please call with any questions.  Wyn Raddle, Jackee Shove, NP  06/25/2024, 3:38 PM

## 2024-07-06 DIAGNOSIS — E230 Hypopituitarism: Secondary | ICD-10-CM | POA: Insufficient documentation

## 2024-07-06 DIAGNOSIS — E042 Nontoxic multinodular goiter: Secondary | ICD-10-CM | POA: Insufficient documentation

## 2024-07-06 DIAGNOSIS — Q159 Congenital malformation of eye, unspecified: Secondary | ICD-10-CM | POA: Insufficient documentation

## 2024-07-06 DIAGNOSIS — Z92241 Personal history of systemic steroid therapy: Secondary | ICD-10-CM | POA: Insufficient documentation

## 2024-07-06 DIAGNOSIS — E242 Drug-induced Cushing's syndrome: Secondary | ICD-10-CM | POA: Insufficient documentation

## 2024-07-10 ENCOUNTER — Encounter: Payer: Self-pay | Admitting: Cardiology

## 2024-07-10 ENCOUNTER — Ambulatory Visit: Attending: Cardiology | Admitting: Cardiology

## 2024-07-10 VITALS — BP 98/62 | HR 80 | Ht 60.0 in | Wt 129.6 lb

## 2024-07-10 DIAGNOSIS — I2510 Atherosclerotic heart disease of native coronary artery without angina pectoris: Secondary | ICD-10-CM | POA: Insufficient documentation

## 2024-07-10 DIAGNOSIS — I10 Essential (primary) hypertension: Secondary | ICD-10-CM | POA: Insufficient documentation

## 2024-07-10 DIAGNOSIS — E7800 Pure hypercholesterolemia, unspecified: Secondary | ICD-10-CM | POA: Diagnosis present

## 2024-07-10 DIAGNOSIS — Z01818 Encounter for other preprocedural examination: Secondary | ICD-10-CM | POA: Diagnosis not present

## 2024-07-10 MED ORDER — NEXLIZET 180-10 MG PO TABS: 1.0000 | ORAL_TABLET | Freq: Every day | ORAL | Status: AC

## 2024-07-10 MED ORDER — NEXLIZET 180-10 MG PO TABS
1.0000 | ORAL_TABLET | Freq: Every day | ORAL | 3 refills | Status: AC
Start: 2024-07-10 — End: ?

## 2024-07-10 NOTE — Progress Notes (Signed)
 Cardiology Office Note:    Date:  07/10/2024   ID:  Meghan Tucker, DOB September 29, 1946, MRN [id number]  PCP:  Jama Chow, MD  Cardiologist:  Lamar Fitch, MD    Referring MD: Jama Chow, MD   Chief Complaint  Patient presents with   Clearance TBD    History of Present Illness:    Meghan Tucker is a 78 y.o. female past medical history significant for essential hypertension dyslipidemia she was referred to us  for evaluation before elective foot surgery.  She did have echocardiogram which showed normal left ventricular ejection fraction without significant valvular pathology, coronary CT angio showed a mild stenosis in the proximal LAD with calcium  score of 79.1 which is 55th percentile.  Comes today to talk about his overall she is doing well.  She is waiting for her food surgery.  Denies have any chest pain tightness squeezing pressure burning chest  Past Medical History:  Diagnosis Date   ADD (attention deficit disorder)    Adrenal insufficiency (HCC)    Allergic rhinitis    Arthritis    all over   B12 deficiency    Chronic back pain    2 buldging disc,stenosis   Diverticulosis    DOE (dyspnea on exertion)    Essential hypertension    takes Metoprolol  daily   GERD (gastroesophageal reflux disease)    takes Omeprazole  daily   History of hiatal hernia    History of kidney stones    History of migraine    none since menopause yrs ago   Hypercholesterolemia 08/23/2014   Hyperlipidemia    takes Fish Oil daily   Hypertension    takes Metoprolol  daily   Hyperthyroidism 04/17/2021   Hypokalemia    IBS (irritable bowel syndrome) 10/31/2020   Insomnia    takes Ambien  nightly   Major depressive disorder, recurrent episode, in partial remission (HCC)    OSA (obstructive sleep apnea)    Osteoarthritis 08/22/2014   Pseudophakia of both eyes 05/03/2017   RLS (restless legs syndrome) 08/17/2022   Sciatica    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 CHRISTELLA in [place]   ESOPHAGOGASTRODUODENOSCOPY     Dr CHRISTELLA in [place]   ESOPHAGOGASTRODUODENOSCOPY (EGD) WITH ESOPHAGEAL DILATION  05/14/2015   with Propofol    ESOPHAGOGASTRODUODENOSCOPY (EGD) WITH PROPOFOL   10/19/2013   GANGLION CYST EXCISION     GANGLION CYST EXCISION     HAMMERTOE RECONSTRUCTION WITH WEIL OSTEOTOMY Left 07/17/2015   Procedure: LEFT 2-3 WEIL OSTEOTOMY AND HAMMERTOE CORRECTION ;  Surgeon: Norleen Armor, MD;  Location: [facility];  Service: Orthopedics;  Laterality: Left;   HAND SURGERY Bilateral     Current Medications: Current Meds  Medication Sig   amLODipine (NORVASC) 5 MG tablet Take 5 mg by mouth daily.   Apoaequorin (PREVAGEN PO) Take 1 tablet by mouth daily.   buPROPion (WELLBUTRIN XL) 300 MG 24 hr tablet Take 300 mg by mouth daily.   cholestyramine  (QUESTRAN ) 4 g packet Take 4 g by mouth daily.   citalopram (  CELEXA) 10 MG tablet Take 10 mg by mouth daily.   denosumab  (PROLIA ) 60 MG/ML SOSY injection Inject 60 mg into the skin every 6 (six) months.   ezetimibe (ZETIA) 10 MG tablet Take 10 mg by mouth daily.   lisinopril (ZESTRIL) 5 MG tablet Take 5 mg by mouth daily.   metoprolol  tartrate (LOPRESSOR ) 100 MG tablet Take one tablet 2 hours before cardiac CT for heart greater than 55   oxyCODONE -acetaminophen  (PERCOCET/ROXICET) 5-325 MG tablet Take 1 tablet by mouth every 4 (four) hours as needed for severe pain (pain score 7-10).   traZODone (DESYREL) 50 MG tablet Take 50 mg by mouth at bedtime as needed for sleep.     Allergies:   Atorvastatin, Rosuvastatin , Corticosteroids,  and Prednisone   Social History   Socioeconomic History   Marital status: Married    Spouse name: Jb   Number of children: 2   Years of education: two years of college   Highest education level: Not on file  Occupational History   Occupation: Retired  Tobacco Use   Smoking status: Never   Smokeless tobacco: Never  Vaping Use   Vaping status: Never Used  Substance and Sexual Activity   Alcohol  use: Yes    Comment: 1-2 glasses champagne nightly   Drug use: Never   Sexual activity: Not on file  Other Topics Concern   Not on file  Social History Narrative   Lives at home with her husband.   Left-handed.   Small can of Red Bull daily (does not always finish).   Social Drivers of Corporate investment banker Strain: Not on file  Food Insecurity: No Food Insecurity (01/31/2023)   [facility]   Hunger Vital Sign    Within the past 12 months, you worried that your food would run out before you got the money to buy more.: Never true    Within the past 12 months, the food you bought just didn't last and you didn't have money to get more.: Never true  Transportation Needs: No Transportation Needs (01/31/2023)   Received from [facility]    Lack of Transportation (Medical): No    Lack of Transportation (Non-Medical): No  Physical Activity: Not on file  Stress: Not on file  Social Connections: Not on file     Family History: The patient's family history includes Cerebrovascular Accident in her paternal grandmother; Heart attack (age of onset: 41) in her father; Heart disease in her father, maternal grandfather, mother, paternal grandfather, and paternal grandmother; Hypertension in her brother and father; Stroke (age of onset: 59) in her mother. There is no history of Colon cancer, Esophageal cancer, or Cancer. ROS:   Please see the history of present illness.    All 14 point review of systems negative except as described per history of  present illness  EKGs/Labs/Other Studies Reviewed:    EKG Interpretation Date/Time:  Tuesday July 10 2024 13:38:32 EDT Ventricular Rate:  85 PR Interval:  138 QRS Duration:  88 QT Interval:  372 QTC Calculation: 442 R Axis:   -14  Text Interpretation: Normal sinus rhythm Cannot rule out Anterior infarct (cited on or before 02-May-2024) Abnormal ECG When compared with ECG of 02-May-2024 11:00, No significant change was found Confirmed by Bernie Charleston [phone]) on 07/10/2024 1:44:51 PM    Recent Labs: No results found for requested labs within last 365 days.  Recent Lipid Panel No results found for: CHOL,  TRIG, HDL, CHOLHDL, VLDL, LDLCALC, LDLDIRECT  Physical Exam:    VS:  BP 98/62 (BP Location: Left Arm, Patient Position: Sitting)   Pulse 80   Ht 5' (1.524 m)   Wt 129 lb 9.6 oz (58.8 kg)   SpO2 (!) 80%   BMI 25.31 kg/m     Wt Readings from Last 3 Encounters:  07/10/24 129 lb 9.6 oz (58.8 kg)  05/02/24 131 lb 3.2 oz (59.5 kg)  03/29/24 128 lb (58.1 kg)     GEN:  Well nourished, well developed in no acute distress HEENT: Normal NECK: No JVD; No carotid bruits LYMPHATICS: No lymphadenopathy CARDIAC: RRR, no murmurs, no rubs, no gallops RESPIRATORY:  Clear to auscultation without rales, wheezing or rhonchi  ABDOMEN: Soft, non-tender, non-distended MUSCULOSKELETAL:  No edema; No deformity  SKIN: Warm and dry LOWER EXTREMITIES: no swelling NEUROLOGIC:  Alert and oriented x 3 PSYCHIATRIC:  Normal affect   ASSESSMENT:    1. Pre-op evaluation   2. Coronary artery disease involving native coronary artery of native heart without angina pectoris   3. Essential hypertension   4. Pure hypercholesterolemia    PLAN:    In order of problems listed above:  Coronary disease only mild based on coronary CT angio, denies have any typical symptoms right now doing well.  Will continue present management which include antiplatelets therapy which I recommend to start  baby aspirin every single day, as well as risk factors modifications. Dyslipidemia I did review [facility] which show me LDL of 142 HDL 69.  She is on Zetia.  I will switch her from Zetia to Nexlizet , recheck fasting lipid profile AST ALT in 6 weeks. Essential hypertension blood pressure little bit on the lower side today but she said at home it is always good, therefore, we will continue present management. We start out about healthy lifestyle which required exercises on the regular basis as well as good diet which we discussed   Medication Adjustments/Labs and Tests Ordered: Current medicines are reviewed at length with the patient today.  Concerns regarding medicines are outlined above.  Orders Placed This Encounter  Procedures   EKG 12-Lead   Medication changes: No orders of the defined types were placed in this encounter.   Signed, Lamar DOROTHA Fitch, MD, [facility] 07/10/2024 2:04 PM    [facility]

## 2024-07-10 NOTE — Patient Instructions (Addendum)
 Medication Instructions:   START: Nexlizet  1 tablet daily  START: Aspirin 81mg  1 tablet daily   Lab Work: Your physician recommends that you return for lab work in: 6 weeks You need to have labs done when you are fasting.  You can come Monday through Friday 8:30 am to 12:00 pm and 1:15 to 4:30. You do not need to make an appointment as the order has already been placed. The labs you are going to have done are  Lipids.    Testing/Procedures: None Ordered   Follow-Up: At [facility], you and your health needs are our priority.  As part of our continuing mission to provide you with exceptional heart care, we have created designated Provider Care Teams.  These Care Teams include your primary Cardiologist (physician) and Advanced Practice Providers (APPs -  Physician Assistants and Nurse Practitioners) who all work together to provide you with the care you need, when you need it.  We recommend signing up for the patient portal called MyChart.  Sign up information is provided on this After Visit Summary.  MyChart is used to connect with patients for Virtual Visits (Telemedicine).  Patients are able to view lab/test results, encounter notes, upcoming appointments, etc.  Non-urgent messages can be sent to your provider as well.   To learn more about what you can do with MyChart, go to [email or web address].    Your next appointment:   6 month(s)  The format for your next appointment:   In Person  Provider:   Lamar Fitch, MD    Other Instructions NA

## 2024-07-11 ENCOUNTER — Telehealth: Payer: Self-pay | Admitting: Pharmacy Technician

## 2024-07-11 ENCOUNTER — Telehealth: Payer: Self-pay

## 2024-07-11 NOTE — Telephone Encounter (Addendum)
   Pharmacy Patient Advocate Encounter   Received notification from [facility] that prior authorization for Nexlizet  is required/requested.   Insurance verification completed.   The patient is insured through [facility] .   Per test claim: PA required; PA submitted to above mentioned insurance via latent Key/confirmation #/[id number] Status is pending

## 2024-07-11 NOTE — Telephone Encounter (Addendum)
 Patient said she will get labs tomorrow done   Per insurance they need labs in the last 120 days. LAST LABS per [facility] were-05/04/2022, per care everywhere found a lab from 02/16/2023, labs were ordered 07/10/24 but not done yet. I called the patient and left a message. Need to submit labs before 07/14/24 or it will deny.     Form in media

## 2024-07-12 NOTE — Telephone Encounter (Signed)
 noted

## 2024-07-13 NOTE — Telephone Encounter (Signed)
Labs still not done

## 2024-07-14 LAB — LIPID PANEL
Chol/HDL Ratio: 2.5 ratio (ref 0.0–4.4)
Cholesterol, Total: 158 mg/dL (ref 100–199)
HDL: 62 mg/dL (ref >39)
LDL Chol Calc (NIH): 81 mg/dL (ref 0–99)
Triglycerides: 78 mg/dL (ref 0–149)
VLDL Cholesterol Cal: 15 mg/dL (ref 5–40)

## 2024-07-16 NOTE — Telephone Encounter (Signed)
 Labs are showing done now. Faxed insurance labs 8:40am 07/16/24. This was denied but I sent in labs so now it will be re-reviewed

## 2024-07-17 NOTE — Telephone Encounter (Signed)
 Clearance from office visit 07/10/24 faxed to [facility] with confirmation received.

## 2024-07-19 ENCOUNTER — Other Ambulatory Visit (HOSPITAL_COMMUNITY): Payer: Self-pay

## 2024-07-19 ENCOUNTER — Telehealth: Payer: Self-pay

## 2024-07-19 ENCOUNTER — Ambulatory Visit: Payer: Self-pay | Admitting: Cardiology

## 2024-07-19 NOTE — Telephone Encounter (Signed)
 Pharmacy Patient Advocate Encounter  Received notification from [facility] that Prior Authorization for nexlizet  has been APPROVED from 07/18/24 to 01/18/25. Ran test claim, Copay is $47.00. This test claim was processed through [facility]- copay amounts may vary at other pharmacies due to pharmacy/plan contracts, or as the patient moves through the different stages of their insurance plan.

## 2024-07-19 NOTE — Telephone Encounter (Signed)
 Left message on My Chart with lab results per Dr. Vanetta Shawl note. Routed to PCP.

## 2024-07-20 NOTE — Telephone Encounter (Addendum)
   Patient Name: Meghan Tucker  DOB: 1946/09/30 MRN: [id number]  Primary Cardiologist: None  Chart reviewed as part of pre-operative protocol coverage. Pre-op clearance already addressed by colleagues in earlier phone notes. To summarize recommendations:  -I am sorry about not addressing that issue yes she is cleared and ready to go for surgery from my point of view  -Dr. Krasowski   No further testing needed from a CV standpoint.    Will route this bundled recommendation to requesting provider via Epic fax function and remove from pre-op pool. Please call with questions.  Orren LOISE Fabry, PA-C 07/20/2024, 11:43 AM

## 2024-12-24 ENCOUNTER — Other Ambulatory Visit: Payer: Self-pay | Admitting: Cardiology
